# Patient Record
Sex: Female | Born: 1940 | Race: White | Hispanic: No | Smoking: Never smoker
Health system: Southern US, Community
[De-identification: ages and names within clinical notes are randomized; demographics above are authoritative.]

## PROBLEM LIST (undated history)

## (undated) DIAGNOSIS — M479 Spondylosis, unspecified: Secondary | ICD-10-CM

## (undated) DIAGNOSIS — K5790 Diverticulosis of intestine, part unspecified, without perforation or abscess without bleeding: Secondary | ICD-10-CM

## (undated) DIAGNOSIS — I2510 Atherosclerotic heart disease of native coronary artery without angina pectoris: Secondary | ICD-10-CM

## (undated) DIAGNOSIS — R569 Unspecified convulsions: Secondary | ICD-10-CM

## (undated) DIAGNOSIS — G40909 Epilepsy, unspecified, not intractable, without status epilepticus: Secondary | ICD-10-CM

## (undated) DIAGNOSIS — I4891 Unspecified atrial fibrillation: Secondary | ICD-10-CM

## (undated) DIAGNOSIS — K219 Gastro-esophageal reflux disease without esophagitis: Secondary | ICD-10-CM

## (undated) DIAGNOSIS — K8020 Calculus of gallbladder without cholecystitis without obstruction: Secondary | ICD-10-CM

## (undated) DIAGNOSIS — K589 Irritable bowel syndrome without diarrhea: Secondary | ICD-10-CM

## (undated) DIAGNOSIS — D649 Anemia, unspecified: Secondary | ICD-10-CM

## (undated) DIAGNOSIS — J309 Allergic rhinitis, unspecified: Secondary | ICD-10-CM

## (undated) DIAGNOSIS — F322 Major depressive disorder, single episode, severe without psychotic features: Secondary | ICD-10-CM

## (undated) DIAGNOSIS — E785 Hyperlipidemia, unspecified: Secondary | ICD-10-CM

## (undated) HISTORY — DX: Anemia, unspecified: D64.9

## (undated) HISTORY — DX: Epilepsy, unspecified, not intractable, without status epilepticus: G40.909

## (undated) HISTORY — DX: Spondylosis, unspecified: M47.9

## (undated) HISTORY — DX: Major depressive disorder, single episode, severe without psychotic features: F32.2

## (undated) HISTORY — DX: Unspecified atrial fibrillation: I48.91

## (undated) HISTORY — DX: Atherosclerotic heart disease of native coronary artery without angina pectoris: I25.10

## (undated) HISTORY — DX: Diverticulosis of intestine, part unspecified, without perforation or abscess without bleeding: K57.90

## (undated) HISTORY — DX: Allergic rhinitis, unspecified: J30.9

## (undated) HISTORY — DX: Irritable bowel syndrome, unspecified: K58.9

## (undated) HISTORY — DX: Hyperlipidemia, unspecified: E78.5

## (undated) HISTORY — DX: Gastro-esophageal reflux disease without esophagitis: K21.9

## (undated) HISTORY — PX: WRIST FRACTURE SURGERY: SHX121

## (undated) HISTORY — DX: Calculus of gallbladder without cholecystitis without obstruction: K80.20

## (undated) HISTORY — PX: TONSILLECTOMY: SUR1361

## (undated) HISTORY — DX: Unspecified convulsions: R56.9

## (undated) HISTORY — PX: TOTAL ABDOMINAL HYSTERECTOMY: SHX209

---

## 2000-05-27 ENCOUNTER — Ambulatory Visit (HOSPITAL_COMMUNITY): Admission: RE | Admit: 2000-05-27 | Discharge: 2000-05-27 | Payer: Self-pay | Admitting: Orthopedic Surgery

## 2000-05-27 ENCOUNTER — Encounter: Payer: Self-pay | Admitting: Orthopedic Surgery

## 2000-06-30 ENCOUNTER — Ambulatory Visit (HOSPITAL_COMMUNITY): Admission: RE | Admit: 2000-06-30 | Discharge: 2000-06-30 | Payer: Self-pay | Admitting: *Deleted

## 2000-07-12 ENCOUNTER — Ambulatory Visit (HOSPITAL_COMMUNITY): Admission: RE | Admit: 2000-07-12 | Discharge: 2000-07-12 | Payer: Self-pay | Admitting: Internal Medicine

## 2005-01-21 ENCOUNTER — Ambulatory Visit: Payer: Self-pay | Admitting: Family Medicine

## 2005-01-28 ENCOUNTER — Ambulatory Visit: Payer: Self-pay | Admitting: Family Medicine

## 2005-02-04 ENCOUNTER — Ambulatory Visit: Payer: Self-pay | Admitting: Family Medicine

## 2005-06-01 ENCOUNTER — Encounter: Payer: Self-pay | Admitting: Family Medicine

## 2005-06-10 ENCOUNTER — Ambulatory Visit: Payer: Self-pay | Admitting: Family Medicine

## 2005-06-24 ENCOUNTER — Encounter: Admission: RE | Admit: 2005-06-24 | Discharge: 2005-06-24 | Payer: Self-pay | Admitting: Family Medicine

## 2005-07-22 ENCOUNTER — Ambulatory Visit: Payer: Self-pay | Admitting: Family Medicine

## 2005-07-29 ENCOUNTER — Ambulatory Visit: Payer: Self-pay | Admitting: Family Medicine

## 2005-07-29 ENCOUNTER — Other Ambulatory Visit: Admission: RE | Admit: 2005-07-29 | Discharge: 2005-07-29 | Payer: Self-pay | Admitting: Family Medicine

## 2005-07-29 ENCOUNTER — Encounter: Payer: Self-pay | Admitting: Family Medicine

## 2006-06-30 ENCOUNTER — Ambulatory Visit: Payer: Self-pay | Admitting: Family Medicine

## 2006-11-18 ENCOUNTER — Ambulatory Visit: Payer: Self-pay | Admitting: Family Medicine

## 2006-11-18 LAB — CONVERTED CEMR LAB: Phenobarbital: 31.7 ug/mL (ref 15.0–40.0)

## 2007-01-04 ENCOUNTER — Encounter: Payer: Self-pay | Admitting: Family Medicine

## 2007-03-03 ENCOUNTER — Encounter: Payer: Self-pay | Admitting: Family Medicine

## 2007-03-03 DIAGNOSIS — M810 Age-related osteoporosis without current pathological fracture: Secondary | ICD-10-CM

## 2007-03-22 ENCOUNTER — Ambulatory Visit: Payer: Self-pay | Admitting: Family Medicine

## 2007-03-22 DIAGNOSIS — M479 Spondylosis, unspecified: Secondary | ICD-10-CM | POA: Insufficient documentation

## 2007-03-22 LAB — CONVERTED CEMR LAB
Glucose, Urine, Semiquant: NEGATIVE
Protein, U semiquant: NEGATIVE
Urobilinogen, UA: NEGATIVE

## 2007-03-23 LAB — CONVERTED CEMR LAB
ALT: 18 units/L (ref 0–35)
Basophils Relative: 0.5 % (ref 0.0–1.0)
Bilirubin, Direct: 0.1 mg/dL (ref 0.0–0.3)
CO2: 29 meq/L (ref 19–32)
Cholesterol: 228 mg/dL (ref 0–200)
Eosinophils Relative: 0.5 % (ref 0.0–5.0)
GFR calc Af Amer: 93 mL/min
Glucose, Bld: 85 mg/dL (ref 70–99)
Hemoglobin: 13 g/dL (ref 12.0–15.0)
Lymphocytes Relative: 34 % (ref 12.0–46.0)
Monocytes Absolute: 0.6 10*3/uL (ref 0.2–0.7)
Neutro Abs: 4.8 10*3/uL (ref 1.4–7.7)
Neutrophils Relative %: 57.3 % (ref 43.0–77.0)
Potassium: 4.2 meq/L (ref 3.5–5.1)
TSH: 0.91 microintl units/mL (ref 0.35–5.50)
Total Protein: 6.8 g/dL (ref 6.0–8.3)
VLDL: 21 mg/dL (ref 0–40)
WBC: 8.2 10*3/uL (ref 4.5–10.5)

## 2007-03-30 ENCOUNTER — Encounter: Admission: RE | Admit: 2007-03-30 | Discharge: 2007-03-30 | Payer: Self-pay | Admitting: Family Medicine

## 2007-04-12 ENCOUNTER — Encounter: Payer: Self-pay | Admitting: Family Medicine

## 2007-05-04 ENCOUNTER — Telehealth: Payer: Self-pay | Admitting: Family Medicine

## 2007-05-13 ENCOUNTER — Emergency Department (HOSPITAL_COMMUNITY): Admission: EM | Admit: 2007-05-13 | Discharge: 2007-05-13 | Payer: Self-pay | Admitting: Emergency Medicine

## 2007-06-16 ENCOUNTER — Telehealth: Payer: Self-pay | Admitting: Family Medicine

## 2007-07-20 ENCOUNTER — Encounter: Payer: Self-pay | Admitting: Family Medicine

## 2007-08-03 ENCOUNTER — Ambulatory Visit: Payer: Self-pay | Admitting: Family Medicine

## 2007-08-03 DIAGNOSIS — F322 Major depressive disorder, single episode, severe without psychotic features: Secondary | ICD-10-CM | POA: Insufficient documentation

## 2007-08-09 ENCOUNTER — Telehealth: Payer: Self-pay | Admitting: Family Medicine

## 2007-08-12 ENCOUNTER — Telehealth: Payer: Self-pay | Admitting: *Deleted

## 2007-09-07 ENCOUNTER — Telehealth: Payer: Self-pay | Admitting: Family Medicine

## 2007-10-04 ENCOUNTER — Ambulatory Visit: Payer: Self-pay | Admitting: Family Medicine

## 2007-10-04 DIAGNOSIS — R609 Edema, unspecified: Secondary | ICD-10-CM

## 2007-10-05 LAB — CONVERTED CEMR LAB: Phenobarbital: 30.1 ug/mL (ref 15.0–40.0)

## 2007-11-09 ENCOUNTER — Telehealth: Payer: Self-pay | Admitting: Family Medicine

## 2008-01-24 ENCOUNTER — Ambulatory Visit: Payer: Self-pay | Admitting: Family Medicine

## 2008-01-24 DIAGNOSIS — J309 Allergic rhinitis, unspecified: Secondary | ICD-10-CM | POA: Insufficient documentation

## 2008-01-30 ENCOUNTER — Telehealth: Payer: Self-pay

## 2008-03-13 ENCOUNTER — Ambulatory Visit: Payer: Self-pay | Admitting: Family Medicine

## 2008-04-04 ENCOUNTER — Other Ambulatory Visit: Admission: RE | Admit: 2008-04-04 | Discharge: 2008-04-04 | Payer: Self-pay | Admitting: Family Medicine

## 2008-04-04 ENCOUNTER — Ambulatory Visit: Payer: Self-pay | Admitting: Family Medicine

## 2008-04-04 ENCOUNTER — Encounter: Payer: Self-pay | Admitting: Family Medicine

## 2008-04-04 DIAGNOSIS — E039 Hypothyroidism, unspecified: Secondary | ICD-10-CM | POA: Insufficient documentation

## 2008-04-04 DIAGNOSIS — E785 Hyperlipidemia, unspecified: Secondary | ICD-10-CM | POA: Insufficient documentation

## 2008-04-04 DIAGNOSIS — N390 Urinary tract infection, site not specified: Secondary | ICD-10-CM

## 2008-04-04 DIAGNOSIS — D649 Anemia, unspecified: Secondary | ICD-10-CM | POA: Insufficient documentation

## 2008-04-11 ENCOUNTER — Telehealth: Payer: Self-pay | Admitting: Family Medicine

## 2008-04-13 ENCOUNTER — Telehealth: Payer: Self-pay | Admitting: Family Medicine

## 2008-04-16 ENCOUNTER — Encounter: Payer: Self-pay | Admitting: Family Medicine

## 2008-04-18 ENCOUNTER — Telehealth: Payer: Self-pay | Admitting: Internal Medicine

## 2008-05-28 ENCOUNTER — Telehealth: Payer: Self-pay | Admitting: Family Medicine

## 2008-10-23 ENCOUNTER — Ambulatory Visit: Payer: Self-pay | Admitting: Family Medicine

## 2008-10-23 DIAGNOSIS — K219 Gastro-esophageal reflux disease without esophagitis: Secondary | ICD-10-CM

## 2008-10-23 DIAGNOSIS — H9319 Tinnitus, unspecified ear: Secondary | ICD-10-CM | POA: Insufficient documentation

## 2008-10-24 ENCOUNTER — Encounter: Payer: Self-pay | Admitting: Family Medicine

## 2008-11-05 ENCOUNTER — Telehealth: Payer: Self-pay | Admitting: Family Medicine

## 2008-11-06 LAB — CONVERTED CEMR LAB
Albumin: 3.8 g/dL (ref 3.5–5.2)
Alkaline Phosphatase: 103 units/L (ref 39–117)
Bilirubin, Direct: 0 mg/dL (ref 0.0–0.3)
HDL: 50.4 mg/dL (ref >39.00)
Phenobarbital: 36.5 ug/mL (ref 15.0–40.0)
Total CHOL/HDL Ratio: 4
VLDL: 23.8 mg/dL (ref 0.0–40.0)

## 2008-11-14 ENCOUNTER — Ambulatory Visit: Payer: Self-pay | Admitting: Family Medicine

## 2008-11-14 DIAGNOSIS — K589 Irritable bowel syndrome without diarrhea: Secondary | ICD-10-CM

## 2008-11-14 DIAGNOSIS — K8020 Calculus of gallbladder without cholecystitis without obstruction: Secondary | ICD-10-CM | POA: Insufficient documentation

## 2008-11-15 ENCOUNTER — Telehealth: Payer: Self-pay | Admitting: Family Medicine

## 2008-11-16 ENCOUNTER — Encounter: Admission: RE | Admit: 2008-11-16 | Discharge: 2008-11-16 | Payer: Self-pay | Admitting: Family Medicine

## 2008-11-20 ENCOUNTER — Telehealth: Payer: Self-pay | Admitting: Family Medicine

## 2008-11-26 ENCOUNTER — Telehealth: Payer: Self-pay | Admitting: Family Medicine

## 2008-12-11 ENCOUNTER — Ambulatory Visit: Payer: Self-pay | Admitting: Family Medicine

## 2008-12-17 ENCOUNTER — Telehealth (INDEPENDENT_AMBULATORY_CARE_PROVIDER_SITE_OTHER): Payer: Self-pay | Admitting: *Deleted

## 2009-01-01 ENCOUNTER — Encounter: Payer: Self-pay | Admitting: Family Medicine

## 2009-01-02 ENCOUNTER — Ambulatory Visit: Payer: Self-pay | Admitting: Family Medicine

## 2009-01-02 ENCOUNTER — Telehealth (INDEPENDENT_AMBULATORY_CARE_PROVIDER_SITE_OTHER): Payer: Self-pay | Admitting: *Deleted

## 2009-01-02 DIAGNOSIS — R079 Chest pain, unspecified: Secondary | ICD-10-CM

## 2009-01-02 DIAGNOSIS — I2510 Atherosclerotic heart disease of native coronary artery without angina pectoris: Secondary | ICD-10-CM | POA: Insufficient documentation

## 2009-01-03 ENCOUNTER — Ambulatory Visit: Payer: Self-pay | Admitting: Cardiology

## 2009-01-03 ENCOUNTER — Encounter: Payer: Self-pay | Admitting: Family Medicine

## 2009-01-03 ENCOUNTER — Inpatient Hospital Stay (HOSPITAL_COMMUNITY): Admission: AD | Admit: 2009-01-03 | Discharge: 2009-01-05 | Payer: Self-pay | Admitting: Cardiology

## 2009-01-03 ENCOUNTER — Ambulatory Visit: Payer: Self-pay

## 2009-01-09 ENCOUNTER — Telehealth (INDEPENDENT_AMBULATORY_CARE_PROVIDER_SITE_OTHER): Payer: Self-pay | Admitting: *Deleted

## 2009-01-10 ENCOUNTER — Encounter: Payer: Self-pay | Admitting: Cardiology

## 2009-01-10 ENCOUNTER — Ambulatory Visit: Payer: Self-pay

## 2009-01-11 ENCOUNTER — Ambulatory Visit: Payer: Self-pay

## 2009-01-11 ENCOUNTER — Encounter: Payer: Self-pay | Admitting: Cardiology

## 2009-01-14 ENCOUNTER — Telehealth: Payer: Self-pay | Admitting: Cardiology

## 2009-01-22 ENCOUNTER — Ambulatory Visit: Payer: Self-pay | Admitting: Cardiology

## 2009-01-29 ENCOUNTER — Ambulatory Visit: Payer: Self-pay | Admitting: Cardiology

## 2009-02-05 ENCOUNTER — Telehealth: Payer: Self-pay | Admitting: Cardiology

## 2009-02-11 LAB — CONVERTED CEMR LAB
ALT: 17 units/L (ref 0–35)
Albumin: 3.9 g/dL (ref 3.5–5.2)
Alkaline Phosphatase: 95 units/L (ref 39–117)
BUN: 23 mg/dL (ref 6–23)
Chloride: 106 meq/L (ref 96–112)
Cholesterol: 167 mg/dL (ref 0–200)
Creatinine, Ser: 1.4 mg/dL — ABNORMAL HIGH (ref 0.4–1.2)
GFR calc non Af Amer: 39.88 mL/min (ref >60)
LDL Cholesterol: 95 mg/dL (ref 0–99)
Total Protein: 7.8 g/dL (ref 6.0–8.3)
Triglycerides: 118 mg/dL (ref 0.0–149.0)
VLDL: 23.6 mg/dL (ref 0.0–40.0)
Vit D, 25-Hydroxy: 52 ng/mL (ref 30–89)

## 2009-02-28 ENCOUNTER — Ambulatory Visit: Payer: Self-pay | Admitting: Family Medicine

## 2009-04-03 ENCOUNTER — Encounter: Payer: Self-pay | Admitting: Family Medicine

## 2009-04-10 ENCOUNTER — Ambulatory Visit: Payer: Self-pay | Admitting: Family Medicine

## 2009-04-10 ENCOUNTER — Encounter: Payer: Self-pay | Admitting: Family Medicine

## 2009-04-10 ENCOUNTER — Other Ambulatory Visit: Admission: RE | Admit: 2009-04-10 | Discharge: 2009-04-10 | Payer: Self-pay | Admitting: Family Medicine

## 2009-04-10 DIAGNOSIS — M719 Bursopathy, unspecified: Secondary | ICD-10-CM

## 2009-04-10 DIAGNOSIS — M940 Chondrocostal junction syndrome [Tietze]: Secondary | ICD-10-CM

## 2009-04-10 DIAGNOSIS — R259 Unspecified abnormal involuntary movements: Secondary | ICD-10-CM | POA: Insufficient documentation

## 2009-04-10 DIAGNOSIS — M67919 Unspecified disorder of synovium and tendon, unspecified shoulder: Secondary | ICD-10-CM | POA: Insufficient documentation

## 2009-04-10 LAB — CONVERTED CEMR LAB
Blood in Urine, dipstick: NEGATIVE
Glucose, Urine, Semiquant: NEGATIVE
Nitrite: NEGATIVE
Specific Gravity, Urine: 1.015
WBC Urine, dipstick: NEGATIVE
pH: 7

## 2009-04-17 ENCOUNTER — Encounter: Payer: Self-pay | Admitting: Family Medicine

## 2009-04-17 LAB — CONVERTED CEMR LAB
AST: 22 units/L (ref 0–37)
Albumin: 4 g/dL (ref 3.5–5.2)
Alkaline Phosphatase: 98 units/L (ref 39–117)
Basophils Absolute: 0 10*3/uL (ref 0.0–0.1)
Basophils Relative: 0.5 % (ref 0.0–3.0)
CO2: 30 meq/L (ref 19–32)
Calcium: 9.5 mg/dL (ref 8.4–10.5)
Chloride: 101 meq/L (ref 96–112)
Eosinophils Absolute: 0.1 10*3/uL (ref 0.0–0.7)
Glucose, Bld: 94 mg/dL (ref 70–99)
HCT: 34.7 % — ABNORMAL LOW (ref 36.0–46.0)
HDL: 54.1 mg/dL (ref >39.00)
Hemoglobin: 12.2 g/dL (ref 12.0–15.0)
Lymphocytes Relative: 26.9 % (ref 12.0–46.0)
Lymphs Abs: 1.8 10*3/uL (ref 0.7–4.0)
MCHC: 35.3 g/dL (ref 30.0–36.0)
MCV: 94.5 fL (ref 78.0–100.0)
Neutro Abs: 4.4 10*3/uL (ref 1.4–7.7)
Potassium: 4 meq/L (ref 3.5–5.1)
RBC: 3.67 M/uL — ABNORMAL LOW (ref 3.87–5.11)
RDW: 12.6 % (ref 11.5–14.6)
Sodium: 142 meq/L (ref 135–145)
TSH: 0.35 microintl units/mL (ref 0.35–5.50)
Total CHOL/HDL Ratio: 3
Total Protein: 7.8 g/dL (ref 6.0–8.3)

## 2009-04-30 ENCOUNTER — Ambulatory Visit: Payer: Self-pay | Admitting: Cardiology

## 2009-06-05 ENCOUNTER — Ambulatory Visit: Payer: Self-pay | Admitting: Internal Medicine

## 2009-06-05 ENCOUNTER — Encounter (INDEPENDENT_AMBULATORY_CARE_PROVIDER_SITE_OTHER): Payer: Self-pay | Admitting: *Deleted

## 2009-06-06 ENCOUNTER — Telehealth: Payer: Self-pay | Admitting: Family Medicine

## 2009-06-18 ENCOUNTER — Ambulatory Visit: Payer: Self-pay | Admitting: Internal Medicine

## 2009-06-21 ENCOUNTER — Encounter: Payer: Self-pay | Admitting: Internal Medicine

## 2009-06-24 ENCOUNTER — Ambulatory Visit: Payer: Self-pay | Admitting: Cardiology

## 2009-07-01 LAB — CONVERTED CEMR LAB
AST: 21 units/L (ref 0–37)
Alkaline Phosphatase: 93 units/L (ref 39–117)
Bilirubin, Direct: 0.1 mg/dL (ref 0.0–0.3)
Cholesterol: 132 mg/dL (ref 0–200)
LDL Cholesterol: 63 mg/dL (ref 0–99)
Total CHOL/HDL Ratio: 2

## 2009-07-17 ENCOUNTER — Ambulatory Visit: Payer: Self-pay | Admitting: Family Medicine

## 2009-07-17 DIAGNOSIS — J0100 Acute maxillary sinusitis, unspecified: Secondary | ICD-10-CM

## 2009-07-20 ENCOUNTER — Ambulatory Visit: Payer: Self-pay | Admitting: Cardiology

## 2009-07-20 ENCOUNTER — Ambulatory Visit: Payer: Self-pay | Admitting: Pulmonary Disease

## 2009-07-21 ENCOUNTER — Encounter: Payer: Self-pay | Admitting: Cardiology

## 2009-07-22 ENCOUNTER — Encounter: Payer: Self-pay | Admitting: Cardiology

## 2009-07-22 ENCOUNTER — Telehealth: Payer: Self-pay | Admitting: Family Medicine

## 2009-07-23 ENCOUNTER — Encounter: Payer: Self-pay | Admitting: Cardiology

## 2009-07-31 ENCOUNTER — Ambulatory Visit: Payer: Self-pay | Admitting: Cardiology

## 2009-07-31 ENCOUNTER — Encounter: Payer: Self-pay | Admitting: Adult Health

## 2009-07-31 ENCOUNTER — Ambulatory Visit: Payer: Self-pay | Admitting: Critical Care Medicine

## 2009-07-31 DIAGNOSIS — I4891 Unspecified atrial fibrillation: Secondary | ICD-10-CM

## 2009-07-31 DIAGNOSIS — J984 Other disorders of lung: Secondary | ICD-10-CM | POA: Insufficient documentation

## 2009-07-31 DIAGNOSIS — E041 Nontoxic single thyroid nodule: Secondary | ICD-10-CM | POA: Insufficient documentation

## 2009-08-02 ENCOUNTER — Telehealth (INDEPENDENT_AMBULATORY_CARE_PROVIDER_SITE_OTHER): Payer: Self-pay | Admitting: *Deleted

## 2009-08-02 ENCOUNTER — Telehealth: Payer: Self-pay | Admitting: Cardiology

## 2009-08-05 ENCOUNTER — Telehealth (INDEPENDENT_AMBULATORY_CARE_PROVIDER_SITE_OTHER): Payer: Self-pay | Admitting: *Deleted

## 2009-08-07 ENCOUNTER — Ambulatory Visit: Payer: Self-pay | Admitting: Family Medicine

## 2009-08-13 ENCOUNTER — Other Ambulatory Visit: Admission: RE | Admit: 2009-08-13 | Discharge: 2009-08-13 | Payer: Self-pay | Admitting: Interventional Radiology

## 2009-08-13 ENCOUNTER — Encounter: Payer: Self-pay | Admitting: Cardiology

## 2009-08-13 ENCOUNTER — Encounter: Admission: RE | Admit: 2009-08-13 | Discharge: 2009-08-13 | Payer: Self-pay | Admitting: Cardiology

## 2009-08-19 ENCOUNTER — Ambulatory Visit: Payer: Self-pay | Admitting: Cardiology

## 2009-08-22 ENCOUNTER — Ambulatory Visit: Payer: Self-pay | Admitting: Critical Care Medicine

## 2009-08-22 DIAGNOSIS — J4540 Moderate persistent asthma, uncomplicated: Secondary | ICD-10-CM

## 2009-08-28 ENCOUNTER — Telehealth: Payer: Self-pay | Admitting: Family Medicine

## 2009-09-17 ENCOUNTER — Ambulatory Visit: Payer: Self-pay | Admitting: Cardiology

## 2009-09-18 ENCOUNTER — Ambulatory Visit: Payer: Self-pay | Admitting: Cardiology

## 2009-09-24 LAB — CONVERTED CEMR LAB
AST: 25 units/L (ref 0–37)
Albumin: 3.5 g/dL (ref 3.5–5.2)
Alkaline Phosphatase: 74 units/L (ref 39–117)
Total Protein: 6.3 g/dL (ref 6.0–8.3)

## 2009-10-03 LAB — CONVERTED CEMR LAB: Cholesterol: 136 mg/dL (ref 0–200)

## 2009-11-12 ENCOUNTER — Ambulatory Visit: Payer: Self-pay | Admitting: Family Medicine

## 2009-11-19 ENCOUNTER — Ambulatory Visit: Payer: Self-pay | Admitting: Cardiology

## 2009-11-26 ENCOUNTER — Ambulatory Visit: Payer: Self-pay | Admitting: Critical Care Medicine

## 2009-11-28 ENCOUNTER — Telehealth: Payer: Self-pay | Admitting: Critical Care Medicine

## 2009-12-23 ENCOUNTER — Telehealth (INDEPENDENT_AMBULATORY_CARE_PROVIDER_SITE_OTHER): Payer: Self-pay | Admitting: *Deleted

## 2009-12-30 ENCOUNTER — Telehealth (INDEPENDENT_AMBULATORY_CARE_PROVIDER_SITE_OTHER): Payer: Self-pay | Admitting: *Deleted

## 2010-03-19 ENCOUNTER — Ambulatory Visit: Payer: Self-pay | Admitting: Cardiology

## 2010-03-22 ENCOUNTER — Ambulatory Visit: Payer: Self-pay | Admitting: Cardiology

## 2010-03-22 ENCOUNTER — Inpatient Hospital Stay (HOSPITAL_COMMUNITY): Admission: EM | Admit: 2010-03-22 | Discharge: 2010-03-23 | Payer: Self-pay | Admitting: Emergency Medicine

## 2010-03-24 ENCOUNTER — Telehealth: Payer: Self-pay | Admitting: Cardiology

## 2010-03-25 ENCOUNTER — Ambulatory Visit: Payer: Self-pay | Admitting: Cardiology

## 2010-03-25 ENCOUNTER — Ambulatory Visit: Payer: Self-pay | Admitting: Critical Care Medicine

## 2010-03-31 ENCOUNTER — Encounter: Payer: Self-pay | Admitting: Physician Assistant

## 2010-04-01 ENCOUNTER — Encounter: Payer: Self-pay | Admitting: Physician Assistant

## 2010-04-01 ENCOUNTER — Ambulatory Visit: Payer: Self-pay | Admitting: Cardiovascular Disease

## 2010-04-01 DIAGNOSIS — N259 Disorder resulting from impaired renal tubular function, unspecified: Secondary | ICD-10-CM | POA: Insufficient documentation

## 2010-04-01 LAB — CONVERTED CEMR LAB
CO2: 30 meq/L (ref 19–32)
Chloride: 106 meq/L (ref 96–112)
Glucose, Bld: 96 mg/dL (ref 70–99)
Potassium: 4.5 meq/L (ref 3.5–5.1)
Sodium: 143 meq/L (ref 135–145)

## 2010-04-22 ENCOUNTER — Encounter: Payer: Self-pay | Admitting: Family Medicine

## 2010-04-24 ENCOUNTER — Emergency Department (HOSPITAL_COMMUNITY): Admission: EM | Admit: 2010-04-24 | Discharge: 2010-04-24 | Payer: Self-pay | Admitting: Emergency Medicine

## 2010-05-05 ENCOUNTER — Encounter: Payer: Self-pay | Admitting: Cardiology

## 2010-05-05 ENCOUNTER — Ambulatory Visit: Payer: Self-pay | Admitting: Cardiology

## 2010-05-06 ENCOUNTER — Ambulatory Visit: Payer: Self-pay | Admitting: Critical Care Medicine

## 2010-05-08 ENCOUNTER — Inpatient Hospital Stay (HOSPITAL_COMMUNITY): Admission: EM | Admit: 2010-05-08 | Discharge: 2009-07-24 | Payer: Self-pay | Admitting: Emergency Medicine

## 2010-05-08 ENCOUNTER — Ambulatory Visit: Payer: Self-pay | Admitting: Cardiology

## 2010-05-09 LAB — CONVERTED CEMR LAB
BUN: 26 mg/dL — ABNORMAL HIGH (ref 6–23)
CO2: 30 meq/L (ref 19–32)
Chloride: 103 meq/L (ref 96–112)
Creatinine, Ser: 1.5 mg/dL — ABNORMAL HIGH (ref 0.4–1.2)
Glucose, Bld: 95 mg/dL (ref 70–99)

## 2010-05-14 LAB — CONVERTED CEMR LAB
GFR calc non Af Amer: 43.66 mL/min — ABNORMAL LOW (ref >60.00)
Potassium: 4.1 meq/L (ref 3.5–5.1)
Sodium: 143 meq/L (ref 135–145)

## 2010-05-19 ENCOUNTER — Telehealth: Payer: Self-pay | Admitting: Cardiology

## 2010-05-27 ENCOUNTER — Encounter: Payer: Self-pay | Admitting: Family Medicine

## 2010-05-27 ENCOUNTER — Ambulatory Visit: Payer: Self-pay | Admitting: Family Medicine

## 2010-06-03 ENCOUNTER — Telehealth: Payer: Self-pay | Admitting: Family Medicine

## 2010-06-03 LAB — CONVERTED CEMR LAB
Basophils Relative: 0.2 % (ref 0.0–3.0)
CO2: 28 meq/L (ref 19–32)
Calcium: 8.8 mg/dL (ref 8.4–10.5)
Chloride: 107 meq/L (ref 96–112)
Eosinophils Absolute: 0 10*3/uL (ref 0.0–0.7)
Eosinophils Relative: 0 % (ref 0.0–5.0)
Hemoglobin: 10.9 g/dL — ABNORMAL LOW (ref 12.0–15.0)
Lymphocytes Relative: 15.5 % (ref 12.0–46.0)
MCHC: 33.4 g/dL (ref 30.0–36.0)
MCV: 95.5 fL (ref 78.0–100.0)
Neutro Abs: 7.7 10*3/uL (ref 1.4–7.7)
RBC: 3.4 M/uL — ABNORMAL LOW (ref 3.87–5.11)
Sodium: 143 meq/L (ref 135–145)
WBC: 9.7 10*3/uL (ref 4.5–10.5)

## 2010-06-13 ENCOUNTER — Ambulatory Visit
Admission: RE | Admit: 2010-06-13 | Discharge: 2010-06-13 | Payer: Self-pay | Source: Home / Self Care | Attending: Internal Medicine | Admitting: Internal Medicine

## 2010-06-17 ENCOUNTER — Inpatient Hospital Stay (HOSPITAL_COMMUNITY)
Admission: EM | Admit: 2010-06-17 | Discharge: 2010-06-18 | Payer: Self-pay | Source: Home / Self Care | Attending: Cardiovascular Disease | Admitting: Cardiovascular Disease

## 2010-06-17 ENCOUNTER — Ambulatory Visit: Admit: 2010-06-17 | Payer: Self-pay | Admitting: Cardiology

## 2010-06-18 LAB — DIFFERENTIAL
Basophils Absolute: 0 10*3/uL (ref 0.0–0.1)
Basophils Relative: 0 % (ref 0–1)
Eosinophils Absolute: 0.2 10*3/uL (ref 0.0–0.7)
Eosinophils Relative: 2 % (ref 0–5)
Lymphocytes Relative: 33 % (ref 12–46)
Lymphs Abs: 2.6 10*3/uL (ref 0.7–4.0)
Monocytes Absolute: 0.6 10*3/uL (ref 0.1–1.0)
Monocytes Relative: 7 % (ref 3–12)
Neutro Abs: 4.7 10*3/uL (ref 1.7–7.7)
Neutrophils Relative %: 58 % (ref 43–77)

## 2010-06-18 LAB — BASIC METABOLIC PANEL
BUN: 23 mg/dL (ref 6–23)
CO2: 24 mEq/L (ref 19–32)
Calcium: 8.9 mg/dL (ref 8.4–10.5)
Chloride: 109 mEq/L (ref 96–112)
Creatinine, Ser: 1.06 mg/dL (ref 0.4–1.2)
GFR calc Af Amer: >60 mL/min (ref >60)
GFR calc non Af Amer: 52 mL/min — ABNORMAL LOW (ref >60)
Glucose, Bld: 127 mg/dL — ABNORMAL HIGH (ref 70–99)
Potassium: 3.2 mEq/L — ABNORMAL LOW (ref 3.5–5.1)
Sodium: 142 mEq/L (ref 135–145)

## 2010-06-18 LAB — CBC
HCT: 35.5 % — ABNORMAL LOW (ref 36.0–46.0)
Hemoglobin: 11.3 g/dL — ABNORMAL LOW (ref 12.0–15.0)
MCH: 30.3 pg (ref 26.0–34.0)
MCHC: 31.8 g/dL (ref 30.0–36.0)
MCV: 95.2 fL (ref 78.0–100.0)
Platelets: 232 10*3/uL (ref 150–400)
RBC: 3.73 MIL/uL — ABNORMAL LOW (ref 3.87–5.11)
RDW: 13.3 % (ref 11.5–15.5)
WBC: 8.1 10*3/uL (ref 4.0–10.5)

## 2010-06-18 LAB — POCT CARDIAC MARKERS
CKMB, poc: <1 ng/mL — ABNORMAL LOW (ref 1.0–8.0)
Myoglobin, poc: 66.6 ng/mL (ref 12–200)
Troponin i, poc: <0.05 ng/mL (ref 0.00–0.09)

## 2010-06-18 LAB — MRSA PCR SCREENING: MRSA by PCR: NEGATIVE

## 2010-06-23 LAB — CBC
HCT: 32.4 % — ABNORMAL LOW (ref 36.0–46.0)
Hemoglobin: 10.1 g/dL — ABNORMAL LOW (ref 12.0–15.0)
MCH: 29.9 pg (ref 26.0–34.0)
MCHC: 31.2 g/dL (ref 30.0–36.0)
Platelets: 209 10*3/uL (ref 150–400)
RBC: 3.38 MIL/uL — ABNORMAL LOW (ref 3.87–5.11)
RDW: 13.5 % (ref 11.5–15.5)
WBC: 8 10*3/uL (ref 4.0–10.5)

## 2010-06-23 LAB — BASIC METABOLIC PANEL
Calcium: 8.6 mg/dL (ref 8.4–10.5)
GFR calc non Af Amer: 53 mL/min — ABNORMAL LOW (ref >60)
Glucose, Bld: 103 mg/dL — ABNORMAL HIGH (ref 70–99)
Sodium: 140 mEq/L (ref 135–145)

## 2010-06-23 LAB — HEPATIC FUNCTION PANEL
ALT: 10 U/L (ref 0–35)
Albumin: 3 g/dL — ABNORMAL LOW (ref 3.5–5.2)
Alkaline Phosphatase: 76 U/L (ref 39–117)
Bilirubin, Direct: <0.1 mg/dL (ref 0.0–0.3)
Total Protein: 5.8 g/dL — ABNORMAL LOW (ref 6.0–8.3)

## 2010-06-29 LAB — CONVERTED CEMR LAB
ALT: 20 units/L (ref 0–35)
AST: 23 units/L (ref 0–37)
Albumin: 3.8 g/dL (ref 3.5–5.2)
Alkaline Phosphatase: 94 units/L (ref 39–117)
BUN: 31 mg/dL — ABNORMAL HIGH (ref 6–23)
Basophils Relative: 0.7 % (ref 0.0–3.0)
Blood in Urine, dipstick: NEGATIVE
CO2: 31 meq/L (ref 19–32)
Chloride: 102 meq/L (ref 96–112)
Creatinine, Ser: 1.2 mg/dL (ref 0.4–1.2)
Direct LDL: 158.8 mg/dL
Eosinophils Absolute: 0 10*3/uL (ref 0.0–0.7)
Eosinophils Relative: 0.6 % (ref 0.0–5.0)
Glucose, Bld: 86 mg/dL (ref 70–99)
HDL: 53.4 mg/dL (ref >39.0)
Lymphocytes Relative: 26.8 % (ref 12.0–46.0)
MCV: 94.3 fL (ref 78.0–100.0)
Monocytes Relative: 5.1 % (ref 3.0–12.0)
Neutrophils Relative %: 66.8 % (ref 43.0–77.0)
Nitrite: NEGATIVE
RBC: 3.82 M/uL — ABNORMAL LOW (ref 3.87–5.11)
Specific Gravity, Urine: 1.015
Total CHOL/HDL Ratio: 4.8
WBC Urine, dipstick: NEGATIVE
WBC: 7.1 10*3/uL (ref 4.5–10.5)

## 2010-07-01 NOTE — Procedures (Signed)
Summary: Colonoscopy  Patient: Misty Meyer Note: All result statuses are Final unless otherwise noted.  Tests: (1) Colonoscopy (COL)   COL Colonoscopy           DONE     [facility]     [address]     [place], [place]  [address]           COLONOSCOPY PROCEDURE REPORT           PATIENT:  [name], [name]  MR#:  [id number]     BIRTHDATE:  May 15, 1941, 68 yrs. old  GENDER:  female           ENDOSCOPIST:  Wilhemina Bonito. Eda Keys, MD     Referred by:  Dianna Limbo, M.D.           PROCEDURE DATE:  06/18/2009     PROCEDURE:  Colonoscopy with snare polypectomy,     Colonoscopy for control of     bleeding (endo clip placement prophylactically)     ASA CLASS:  Class II     INDICATIONS:  Routine Risk Screening           MEDICATIONS:   Fentanyl 75 mcg IV, Versed 7 mg IV           DESCRIPTION OF PROCEDURE:   After the risks benefits and     alternatives of the procedure were thoroughly explained, informed     consent was obtained.  Digital rectal exam was performed and     revealed no abnormalities.   The LB CF-[id number] endoscope     was introduced through the anus and advanced to the cecum, which     was identified by both the appendix and ileocecal valve, without     limitations. Time to cecum = 3:56 min.The quality of the prep was     excellent, using MoviPrep.  The instrument was then slowly     withdrawn (Time = 16:00 min)as the colon was fully examined.     <<PROCEDUREIMAGES>>           FINDINGS:  A 3.5cm broadbased pedunculated polyp was found in the     sigmoid colon. Polyp was snared, then cauterized with monopolar     cautery. Retrieval was successful. Though no bleeding was     percipitated, the stock was broad based and pulatile. Thus, a     Resolution endo clip was used to close the defect.  An 8mm sessile     polyp was found in the descending colon. Polyp was snared without     cautery. Retrieval was successful.   Mild diverticulosis was found     in the  sigmoid colon.   Retroflexed views in the rectum revealed     no abnormalities.    The scope was then withdrawn from the patient     and the procedure completed.           COMPLICATIONS:  None           ENDOSCOPIC IMPRESSION:     1) Pedunculated polyp in the sigmoid colon - removed and endo     clip placed     2) Sessile polyp in the descending colon - removed     3) Mild diverticulosis in the sigmoid colon     RECOMMENDATIONS:     1) Follow up colonoscopy in in 1-3 years (pending path)           ______________________________     Jonny Ruiz  Lanelle Bal, MD           CC:  Tawny Asal, MD; The Patient           n.     eSIGNED:   Wilhemina Bonito. Eda Keys at 06/18/2009 03:48 PM           Lyndon Code, [id number]  Note: An exclamation mark (!) indicates a result that was not dispersed into the flowsheet. Document Creation Date: 06/18/2009 3:48 PM _______________________________________________________________________  (1) Order result status: Final Collection or observation date-time: 06/18/2009 15:34 Requested date-time:  Receipt date-time:  Reported date-time:  Referring Physician:   Ordering Physician: Fransico Setters [phone]) Specimen Source:  Source: Launa Grill Order Number: [id number] Lab site:   Appended Document: Colonoscopy     Procedures Next Due Date:    Colonoscopy: 06/2012  Appended Document: Colonoscopy reviewed

## 2010-07-01 NOTE — Progress Notes (Signed)
Summary: Change Xopenex  Phone Note Call from Patient Call back at Home Phone [phone]   Caller: Patient Call For: tammy p  Reason for Call: Talk to Nurse Summary of Call: pt took card that was given to her for xopenex and was told that she did not qualify because she has Medicare Part D.  Please call pt back Initial call taken by: Eugene Gavia,  August 05, 2009 1:33 PM  Follow-up for Phone Call        The patient states she called the number on a card given to her in the office for Xopenex and was told that because she has MCR Part D, she cannot use the card and does not qualify. The pt says she cannot afford to spend $65 at the pharmacy for the RX and would like this changed to another type of inhaler for rescue purposes. Please advise. Follow-up by: Michel Bickers CMA,  August 05, 2009 2:36 PM  Additional Follow-up for Phone Call Additional follow up Details #1::        Pt was to state she has no insurance as I was explained from the drug rep to tell pts; If she is unable to use the card then please get new Rx approval from PW; the only reason TP is listed on the Rx is because PW was gone this am. Reynaldo Minium CMA  August 05, 2009 2:47 PM   Msg forwarded to Dr. Delford Field for inhaler change.Michel Bickers CMA  August 05, 2009 2:56 PM    Additional Follow-up for Phone Call Additional follow up Details #2::    get her any inhaler albuterol type she can afford  Follow-up by: Storm Frisk MD,  August 05, 2009 5:21 PM  Additional Follow-up for Phone Call Additional follow up Details #3:: Details for Additional Follow-up Action Taken: The patient is aware of rescue inhaler being changed from Xopenex to [employer].  She will call if she has any problems or questions regarding this medication. Additional Follow-up by: Michel Bickers CMA,  August 05, 2009 5:31 PM  New/Updated Medications: PROAIR HFA 108 (90 BASE) MCG/ACT AERS (ALBUTEROL SULFATE) 2 puffs every four hours as  needed Prescriptions: PROAIR HFA 108 (90 BASE) MCG/ACT AERS (ALBUTEROL SULFATE) 2 puffs every four hours as needed  #1 x 6   Entered by:   Michel Bickers CMA   Authorized by:   Storm Frisk MD   Signed by:   Michel Bickers CMA on 08/05/2009   Method used:   Electronically to        [facility]. [phone]* (retail)       [address]       [place], [place]  [address]       Ph: [phone]       Fax: [phone]   RxID:   [id number]

## 2010-07-01 NOTE — Progress Notes (Signed)
Summary: re meds  Phone Note Call from Patient   Caller: Patient [phone] Reason for Call: Talk to Nurse Summary of Call: pt caling re meds Initial call taken by: Glynda Jaeger,  March 24, 2010 11:51 AM  Follow-up for Phone Call        The pt was discharged from the hospital yesterday.  The pt is very confused about her medications.  The pt wanted to know if she just takes the 180mg  about an hour after her 120mg  Cardizem. I made the pt aware that she should STOP Cardizem 120mg  and START Cardizem 180mg  daily.  The pt was given a new Rx at the hospital. I instructed her to go to the pharmacy today and get this medication filled.  She will start it tomorrow since she has already taken a 120mg  today.  Follow-up by: Julieta Gutting, RN, BSN,  March 24, 2010 12:12 PM    New/Updated Medications: DILT-CD 180 MG XR24H-CAP (DILTIAZEM HCL COATED BEADS) take one capsule daily

## 2010-07-01 NOTE — Assessment & Plan Note (Signed)
Summary: eph   Visit Type:  EPH  CC:  pt states she has had pain in both arms...denies any cp or sob....c/o some edema.  History of Present Illness: Overall doing well.  Thinks her symptoms are improved.  Went to see Pulm today, and some medication changes.  Does not think she has been out of rhythm.  Current Medications (verified): 1)  Boniva 150 Mg  Tabs (Ibandronate Sodium) .... Take 1 Tab By Mouth Once A Month 2)  Phenobarbital 60 Mg  Tabs (Phenobarbital) .... Take 1 Tablet Twice A Day 3)  Crestor 20 Mg Tabs (Rosuvastatin Calcium) .... Take 1 Tab By Mouth At Bedtime 4)  Prevacid 15 Mg Cpdr (Lansoprazole) .Marland Kitchen.. 1 Capsule By Mouth  Every Morning Before Meal 5)  Multivitamins  Tabs (Multiple Vitamin) .... Take 1 Tablet By Mouth Once A Day 6)  Calcium Carbonate-Vitamin D 600-400 Mg-Unit  Tabs (Calcium Carbonate-Vitamin D) .... Take 1 Tablet By Mouth Three Times A Day 7)  Triamterene-Hctz 75-50 Mg Tabs (Triamterene-Hctz) .... Take 1/2  Tablet By Mouth Once A Day 8)  Tylenol Extra Strength 500 Mg Tabs (Acetaminophen) .... Per Bottle 9)  Nitroglycerin 0.4 Mg Subl (Nitroglycerin) .... One Tablet Under Tongue Every 5 Minutes As Needed For Chest Pain---May Repeat Times Three 10)  Metoprolol Succinate 50 Mg Xr24h-Tab (Metoprolol Succinate) .... Take 1 Tablet By Mouth Once A Day 11)  Imodium A-D 2 Mg Tabs (Loperamide Hcl) .... 2 Stat Then 1 After Each Loose Stool 12)  Aspirin 325 Mg Tabs (Aspirin) .... Take 1 Tablet By Mouth Once A Day 13)  Potassium Chloride Crys Cr 20 Meq Cr-Tabs (Potassium Chloride Crys Cr) .... Take One Tablet By Mouth Daily 14)  Symbicort 160-4.5 Mcg/act Aero (Budesonide-Formoterol Fumarate) .... Inhale 2 Puffs Two Times A Day 15)  Dilt-Cd 120 Mg Xr24h-Cap (Diltiazem Hcl Coated Beads) .... Take 1 Capsule By Mouth Once A Day 16)  Flonase 50 Mcg/act Susp (Fluticasone Propionate) .... 2 Sprays Each Nostril Once Daily 17)  Levaquin 250 Mg Tabs (Levofloxacin) .... As Directed  On Hosp Discharge Sheet 18)  Prednisone 10 Mg Tabs (Prednisone) .... Taper As Directed On Hosp Discharge Sheet 19)  Saline Nasal Spray 0.65 % Soln (Saline) .... 3 Sprays Each Nostril Three Times A Day 20)  Xopenex Hfa 45 Mcg/act Aero (Levalbuterol Tartrate) .... Inhale 2 Puffs Every Four Hours As Needed  Allergies: 1)  ! Neosporin Original (Neomycin-Bacitracin-Polymyxin) 2)  ! Codeine 3)  ! Pcn 4)  ! Adhesive Tape  Vital Signs:  Patient profile:   70 year old female Menstrual status:  hysterectomy Height:      64 inches Weight:      172 pounds BMI:     29.63 Pulse rate:   81 / minute Pulse rhythm:   irregular BP sitting:   124 / 70  (left arm) Cuff size:   large  Vitals Entered By: Danielle Rankin, CMA (July 31, 2009 1:52 PM)  Physical Exam  General:  Well developed, well nourished, in no acute distress. Head:  normocephalic and atraumatic Neck:  Cannot feel. Lungs:  Mininmal ronchii.  No rales Heart:  Non-displaced PMI, chest non-tender; regular rate and rhythm, S1, S2 without murmurs, rubs or gallops. Carotid upstroke normal, no bruit. Normal abdominal aortic size, no bruits. Abdomen:  Bowel sounds positive; abdomen soft and non-tender without masses, organomegaly, or hernias noted. No hepatosplenomegaly. Extremities:  No clubbing or cyanosis. Neurologic:  Alert and oriented x 3.   X-ray  Procedure date:  07/23/2009  Findings:       Comparison: Neck CT 07/22/2009.    Findings: The thyroid echotexture is diffusely heterogeneous.  The   right lobe measures 4.7 x 2.4 x 1.6 cm.  The left lobe measures 5.7   x 3.1 x 2.1 cm.  The isthmus measures 6.4 mm.    There are several thyroid nodules bilaterally.  The nodules on the   right are similar in size and appearance, measuring 12 mm   maximally.  There is a dominant nodule inferiorly on the left which   measures 3.3 x 2.2 x 2.6 cm.  This appears solid with some   vascularity on color Doppler.    IMPRESSION:    1.  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   2.  There is a dominant solid nodule inferiorly on the left   measuring up to 3.3 cm in diameter.  Because of the size of this   nodule, malignancy cannot be excluded.  Fine-needle aspiration of   this nodule is recommended.    Read By:  Gerrianne Scale,  M.D.  X-ray  Procedure date:  07/22/2009  Findings:       IMPRESSION:   Multiple small lymph nodes throughout the neck and superior   mediastinum.  Largest right paratracheal node measures 1 cm in   diameter.  Nodes in the neck are not pathologically enlarged but to   their multiplicity could be abnormal.  These could be reactive to   some systemic process.    7 mm nodule posteriorly in the right upper lobe, not visible on the   CT scan of 2002.  Complete chest CT suggested at some point in time   for complete evaluation.  There are some other pulmonary densities   that look like scars.    Enlarged heterogeneous thyroid.  This could be multinodular goiter.   This is not completely evaluated and sonography would be suggested   for that purpose.   EKG  Procedure date:  07/31/2009  Findings:      NSR.  Possible LAE.  Impression & Recommendations:  Problem # 1:  CAD (ICD-414.00) No new symptoms Her updated medication list for this problem includes:    Nitroglycerin 0.4 Mg Subl (Nitroglycerin) ..... One tablet under tongue every 5 minutes as needed for chest pain---may repeat times three    Metoprolol Succinate 50 Mg Xr24h-tab (Metoprolol succinate) .Marland Kitchen... Take 1 tablet by mouth once a day    Aspirin 325 Mg Tabs (Aspirin) .Marland Kitchen... Take 1 tablet by mouth once a day    Dilt-cd 120 Mg Xr24h-cap (Diltiazem hcl coated beads) .Marland Kitchen... Take 1 capsule by mouth once a day  Problem # 2:  ATRIAL FIBRILLATION (ICD-427.31)  No obvious recurrence since hospitalization Her updated medication list for this problem includes:    Metoprolol Succinate 50 Mg Xr24h-tab (Metoprolol succinate)  .Marland Kitchen... Take 1 tablet by mouth once a day    Aspirin 325 Mg Tabs (Aspirin) .Marland Kitchen... Take 1 tablet by mouth once a day  Orders: EKG w/ Interpretation (93000)  Problem # 3:  THYROID NODULE (ICD-241.0)  See report of thyroid.  Needs aspiration.  Will order  Orders: Misc. Referral (Misc. Ref)  Problem # 4:  LUNG NODULE (ICD-518.89)  Abnormal CT.  Needs full CT as noted.  Has followup with Dr. Delford Field.  Orders: CT Scan  (CT Scan)  Patient Instructions: 1)  Your physician recommends that you schedule a follow-up appointment in:  6 WEEKS 2)  Your physician recommends that you continue on your current medications as directed. Please refer to the Current Medication list given to you today. 3)  You have been referred for a Thyroid Biopsy. 4)  Non-Cardiac CT scanning, (CAT scanning), is a noninvasive, special x-ray that produces cross-sectional images of the body using x-rays and a computer. CT scans help physicians diagnose and treat medical conditions. For some CT exams, a contrast material is used to enhance visibility in the area of the body being studied. CT scans provide greater clarity and reveal more details than regular x-ray exams.  Appended Document: eph reviewed and agree with plan of therapy

## 2010-07-01 NOTE — Progress Notes (Signed)
Summary: Need Clarrification  Phone Note Outgoing Call   Call placed by: Gweneth Dimitri RN,  November 28, 2009 3:31 PM Call placed to: Patient Summary of Call: At OV on 11/26/09, PW requesting pt to sign release for records from [facility] in [place], [place].  I have tried to call [facility] twice to get fax number for medical records and was told both times this pt has never been seen there before.  LMOMTCB to ask pt if this is the correct name where she was seen at in [place].  If so, did she go by a different name and if last 4 of SSN is correct in our system. Initial call taken by: Gweneth Dimitri RN,  November 28, 2009 3:33 PM  Follow-up for Phone Call        PT RETURNED CALL FROM [facility]. CALL [phone]. Tivis Ringer, CNA  November 29, 2009 9:27 AM  Called above number, was told pt has left for vacation and will not be back until July 13.  Pt's cell number [phone] , will try to reach pt on that number.  Gweneth Dimitri RN  November 29, 2009 4:38 PM   Additional Follow-up for Phone Call Additional follow up Details #1::        Called, spoke with pt.  Pt states she "thinks" it was [facility] but states since then she believes it has changed to [facility].  Last 4 of SSN correct and pt's last name was Christell Constant at time of treatment.  Pt states she will talk with her older brother to clarrify this info and will call our office back.  Gweneth Dimitri RN  November 29, 2009 4:43 PM     Additional Follow-up for Phone Call Additional follow up Details #2::    Have not heard anything back from pt yet.  ATC [facility] in [place].  Was told pt has not been treated there since at least 1990.  ATC pt's cell phone-LMOMTCB  Gweneth Dimitri RN  December 13, 2009 1:51 PM  ATC pt's home and cell numbers.  Left message at both numbers for pt to call office back. Gweneth Dimitri RN  December 19, 2009 4:35 PM  Called, spoke with pt.  Pt informed [facility] states she has not been treated there since at  least 1990.  Pt still does not know the name of where she was treated--states it was a hospital in [facility].  After googling this, found [facility] in [place]-pt now stating this is the correct hospital.  Will attempt to get records from there.  Gweneth Dimitri RN  December 20, 2009 4:52 PM   Additional Follow-up for Phone Call Additional follow up Details #3:: Details for Additional Follow-up Action Taken: [facility].  Was told they do have records from the time frame we are looking for.  Will fax release of info to them at [phone] and await records.  Gweneth Dimitri RN  December 20, 2009 4:59 PM

## 2010-07-01 NOTE — Assessment & Plan Note (Signed)
Summary: f35m   Visit Type:  4 months follow up Primary Provider:  Judithann Sheen MD  CC:  No complaints.  History of Present Illness: Concerned about her findings.  She is doing well.  No chest pain.  She was down at her brother's house, and she had to climb to the third floor.    Current Medications (verified): 1)  Boniva 150 Mg  Tabs (Ibandronate Sodium) .... Take 1 Tab By Mouth Once A Month 2)  Phenobarbital 60 Mg  Tabs (Phenobarbital) .... Take 1 Tablet Twice A Day 3)  Crestor 20 Mg Tabs (Rosuvastatin Calcium) .... Take 1 Tab By Mouth At Bedtime 4)  Prevacid 15 Mg Cpdr (Lansoprazole) .Marland Kitchen.. 1 Capsule By Mouth  Every Morning Before Meal 5)  Multivitamins  Tabs (Multiple Vitamin) .... Take 1 Tablet By Mouth Once A Day 6)  Calcium Carbonate-Vitamin D 600-400 Mg-Unit  Tabs (Calcium Carbonate-Vitamin D) .... Take 1 Tablet By Mouth Three Times A Day 7)  Triamterene-Hctz 75-50 Mg Tabs (Triamterene-Hctz) .... Take 1/2  Tablet By Mouth Once A Day 8)  Tylenol Extra Strength 500 Mg Tabs (Acetaminophen) .... Per Bottle 9)  Nitroglycerin 0.4 Mg Subl (Nitroglycerin) .... One Tablet Under Tongue Every 5 Minutes As Needed For Chest Pain---May Repeat Times Three 10)  Metoprolol Succinate 50 Mg Xr24h-Tab (Metoprolol Succinate) .... Take 1 Tablet  Once Daily 11)  Imodium A-D 2 Mg Tabs (Loperamide Hcl) .... 2 Stat Then 1 After Each Loose Stool As Needed 12)  Aspirin 325 Mg Tabs (Aspirin) .... Take 1 Tablet By Mouth Once A Day 13)  Potassium Chloride Crys Cr 20 Meq Cr-Tabs (Potassium Chloride Crys Cr) .Marland Kitchen.. 1 Tab Once Daily 14)  Symbicort 160-4.5 Mcg/act Aero (Budesonide-Formoterol Fumarate) .... Inhale 2 Puffs Two Times A Day 15)  Dilt-Cd 120 Mg Xr24h-Cap (Diltiazem Hcl Coated Beads) .... Take 1 Capsule By Mouth Once A Day 16)  Flonase 50 Mcg/act Susp (Fluticasone Propionate) .... 2 Sprays Each Nostril Once Daily 17)  Saline Nasal Spray 0.65 % Soln (Saline) .... 3 Sprays Each Nostril Three Times  A Day 18)  Diclofenac Sodium 75 Mg Tbec (Diclofenac Sodium) .Marland Kitchen.. 1 Two Times A Day Pc For Arthritis 19)  Flexeril 10 Mg Tabs (Cyclobenzaprine Hcl) .Marland Kitchen.. 1 Morn,  Midafternoon and Hs For Muscle Spasm 20)  Vitamin C .... Once Daily 21)  Hepa Filter Mask .... Use As Directed  Allergies: 1)  ! Neosporin Original (Neomycin-Bacitracin-Polymyxin) 2)  ! Codeine 3)  ! Pcn 4)  ! Adhesive Tape  Vital Signs:  Patient profile:   70 year old female Menstrual status:  hysterectomy Height:      64 inches Weight:      182.75 pounds BMI:     31.48 Pulse rate:   86 / minute Pulse rhythm:   regular Resp:     18 per minute BP sitting:   111 / 60  (left arm) Cuff size:   large  Vitals Entered By: Vikki Ports (March 19, 2010 11:25 AM)  Physical Exam  General:  Well developed, well nourished, in no acute distress. Head:  normocephalic and atraumatic Eyes:  PERRLA/EOM intact; conjunctiva and lids normal. Lungs:  Clear bilaterally to auscultation and percussion. Heart:  PMI non displaced.  NOrmal S1 and S2.  No murmur rub or gallop.  Pulses:  pulses normal in all 4 extremities Extremities:  No clubbing or cyanosis. Neurologic:  Alert and oriented x 3.   EKG  Procedure date:  03/19/2010  Findings:      NSR.  Non specific T abnormality.    Impression & Recommendations:  Problem # 1:  CAD (ICD-414.00) No chest pain at present.  Stable at present time.  No recurrent symptoms.  Continue medical therapy. Her updated medication list for this problem includes:    Nitroglycerin 0.4 Mg Subl (Nitroglycerin) ..... One tablet under tongue every 5 minutes as needed for chest pain---may repeat times three    Metoprolol Succinate 50 Mg Xr24h-tab (Metoprolol succinate) .Marland Kitchen... Take 1 tablet  once daily    Aspirin 325 Mg Tabs (Aspirin) .Marland Kitchen... Take 1 tablet by mouth once a day    Dilt-cd 120 Mg Xr24h-cap (Diltiazem hcl coated beads) .Marland Kitchen... Take 1 capsule by mouth once a day  Problem # 2:  LUNG NODULE  (ZOX-096.04) Seeing Dr. Delford Field.  Encouraged her to make an appointment.   Problem # 3:  HYPERLIPIDEMIA (ICD-272.4) recheck lipid and liver Her updated medication list for this problem includes:    Crestor 20 Mg Tabs (Rosuvastatin calcium) .Marland Kitchen... Take 1 tab by mouth at bedtime  Other Orders: EKG w/ Interpretation (93000)  Patient Instructions: 1)  Your physician recommends that you return for a FASTING (Nothing to eat or drink after midnight) Lipid, Liver, BMP, Phenobarbital level and TSH 2)  Your physician recommends that you continue on your current medications as directed. Please refer to the Current Medication list given to you today. 3)  Your physician wants you to follow-up in:   6 MONTHS. You will receive a reminder letter in the mail two months in advance. If you don't receive a letter, please call our office to schedule the follow-up appointment. Prescriptions: NITROGLYCERIN 0.4 MG SUBL (NITROGLYCERIN) One tablet under tongue every 5 minutes as needed for chest pain---may repeat times three  #25 x 2   Entered by:   Julieta Gutting, RN, BSN   Authorized by:   Ronaldo Miyamoto, MD, [facility]   Signed by:   Julieta Gutting, RN, BSN on 03/19/2010   Method used:   Electronically to        [facility]. [phone]* (retail)       [address]       [place], [place]  [address]       Ph: [phone]       Fax: [phone]   RxID:   [id number]

## 2010-07-01 NOTE — Assessment & Plan Note (Signed)
Summary: PER CHECK OUT/SF   Visit Type:  Follow-up   History of Present Illness: Patient had fine needle aspiration of her thyroid, and had a non neoplastic goiter.  Has seen Dr. Scotty Court.  CT scan results reviewed with patient.  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  Denies current cough or other symptoms.  No chest pain at present.  Actually getting alon quite well.    Current Medications (verified): 1)  Boniva 150 Mg  Tabs (Ibandronate Sodium) .... Take 1 Tab By Mouth Once A Month 2)  Phenobarbital 60 Mg  Tabs (Phenobarbital) .... Take 1 Tablet Twice A Day 3)  Crestor 20 Mg Tabs (Rosuvastatin Calcium) .... Take 1 Tab By Mouth At Bedtime 4)  Prevacid 15 Mg Cpdr (Lansoprazole) .Marland Kitchen.. 1 Capsule By Mouth  Every Morning Before Meal 5)  Multivitamins  Tabs (Multiple Vitamin) .... Take 1 Tablet By Mouth Once A Day 6)  Calcium Carbonate-Vitamin D 600-400 Mg-Unit  Tabs (Calcium Carbonate-Vitamin D) .... Take 1 Tablet By Mouth Three Times A Day 7)  Triamterene-Hctz 75-50 Mg Tabs (Triamterene-Hctz) .... Take 1/2  Tablet By Mouth Once A Day 8)  Tylenol Extra Strength 500 Mg Tabs (Acetaminophen) .... Per Bottle 9)  Nitroglycerin 0.4 Mg Subl (Nitroglycerin) .... One Tablet Under Tongue Every 5 Minutes As Needed For Chest Pain---May Repeat Times Three 10)  Metoprolol Succinate 50 Mg Xr24h-Tab (Metoprolol Succinate) .... Take 1 Tablet  Once Daily 11)  Imodium A-D 2 Mg Tabs (Loperamide Hcl) .... 2 Stat Then 1 After Each Loose Stool As Needed 12)  Aspirin 325 Mg Tabs (Aspirin) .... Take 1 Tablet By Mouth Once A Day 13)  Potassium Chloride Crys Cr 20 Meq Cr-Tabs (Potassium Chloride Crys Cr) .... Take One Tablet By Mouth Daily 14)  Symbicort 160-4.5 Mcg/act Aero (Budesonide-Formoterol Fumarate) .... Inhale 2 Puffs Two Times A Day 15)  Dilt-Cd 120 Mg Xr24h-Cap (Diltiazem Hcl Coated Beads) .... Take 1 Capsule By Mouth Once A Day 16)  Flonase 50 Mcg/act Susp (Fluticasone  Propionate) .... 2 Sprays Each Nostril Once Daily 17)  Saline Nasal Spray 0.65 % Soln (Saline) .... 3 Sprays Each Nostril Three Times A Day 18)  Proair Hfa 108 (90 Base) Mcg/act Aers (Albuterol Sulfate) .... 2 Puffs Every Four Hours As Needed  Allergies: 1)  ! Neosporin Original (Neomycin-Bacitracin-Polymyxin) 2)  ! Codeine 3)  ! Pcn 4)  ! Adhesive Tape  Past History:  Past Medical History: Last updated: 01/21/2009 Current Problems:  CAD (ICD-414.00) CHEST PAIN (ICD-786.50) HYPERLIPIDEMIA (ICD-272.4) CERUMEN IMPACTION, RIGHT (ICD-380.4) CHOLELITHIASIS (ICD-574.20) IRRITABLE BOWEL SYNDROME (ICD-564.1) ABDOMINAL PAIN (ICD-789.00) GERD (ICD-530.81) TINNITUS, RIGHT (ICD-388.30) SCREENING FOR MALIGNANT NEOPLASM OF THE CERVIX (ICD-V76.2) SEIZURE DISORDER (ICD-780.39) HYPOTHYROIDISM (ICD-244.9) ANEMIA (ICD-285.9) UNS ADVRS EFF OTH RX MEDICINAL&BIOLOGICAL SBSTNC (EAV-409.81) UTI (ICD-599.0) ALLERGIC RHINITIS (ICD-477.9) EDEMA- LOCALIZED (ICD-782.3) MUSCLE SPASM (ICD-728.85) DEPRESSION, ACUTE (ICD-296.23) ARTHRITIS, BACK (ICD-721.90) EPILEPSY, 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   No children. never smoked social alcohol still working: Producer, television/film/video at [employer]  Vital Signs:  Patient profile:   70 year old female Menstrual status:  hysterectomy Height:      64 inches Weight:      174.25 pounds BMI:     30.02 Pulse rate:  80 / minute Pulse rhythm:   regular Resp:     18 per minute BP sitting:   118 / 60  (left arm) Cuff size:   large  Vitals Entered By: Vikki Ports (September 17, 2009 10:59 AM)  Physical Exam  General:  Well developed, well nourished, in no acute distress. Head:  normocephalic and  atraumatic Eyes:  PERRLA/EOM intact; conjunctiva and lids normal. Lungs:  Clear bilaterally to auscultation and percussion. Heart:  PMI non displaced.  Normal S1 and S2.  Without miurmur.  Abdomen:  Bowel sounds positive; abdomen soft and non-tender without masses, organomegaly, or hernias noted. No hepatosplenomegaly. Msk:  Back normal, normal gait. Muscle strength and tone normal. Pulses:  pulses normal in all 4 extremities Extremities:  No clubbing or cyanosis. Neurologic:  Alert and oriented x 3.   CT Scan  Procedure date:  08/19/2009  Findings:      IMPRESSION:   1.  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 2.  Thyroid nodules.  Please refer to thyroid ultrasound and biopsy performed 07/23/2009 and 08/13/2009, respectively.   Read By:  Reyes Ivan.,  M.D.      Impression & Recommendations:  Problem # 1:  ATRIAL FIBRILLATION (ICD-427.31)  No obvious recurrence.  Rate now controlled. On low dose dilt at present.  Her updated medication list for this problem includes:    Metoprolol Succinate 50 Mg Xr24h-tab (Metoprolol succinate) .Marland Kitchen... Take 1 tablet  once daily    Aspirin 325 Mg Tabs (Aspirin) .Marland Kitchen... Take 1 tablet by mouth once a day  Orders: EKG w/ Interpretation (93000)  Problem # 2:  CAD (ICD-414.00) No current symptoms.  Her updated medication list for this problem includes:    Nitroglycerin 0.4 Mg Subl (Nitroglycerin) ..... One tablet under tongue every 5 minutes as needed for chest pain---may repeat times three    Metoprolol Succinate 50 Mg Xr24h-tab (Metoprolol succinate) .Marland Kitchen... Take 1 tablet  once daily    Aspirin 325 Mg Tabs (Aspirin) .Marland Kitchen... Take 1 tablet by mouth once a day    Dilt-cd 120 Mg Xr24h-cap (Diltiazem hcl coated beads) .Marland Kitchen... Take 1 capsule by mouth once a day  Problem # 3:  HYPERLIPIDEMIA (ICD-272.4) tolerating without difficulty.  Will need  repeat lipid and liver profile.  Her updated medication list for this problem includes:    Crestor 20 Mg Tabs (Rosuvastatin calcium) .Marland Kitchen... Take 1 tab by mouth at bedtime  Problem # 4:  EXTRINSIC ASTHMA, UNSPECIFIED (ICD-493.00) followed by Dr. Delford Field.  On multiple meds.  We called is office to notify him of her CT findings, and he felt they need no further workup.   Her updated medication list for this problem includes:    Symbicort 160-4.5 Mcg/act Aero (Budesonide-formoterol fumarate) ..... Inhale 2 puffs two times a day    Proair Hfa 108 (90 Base) Mcg/act Aers (Albuterol sulfate) .Marland Kitchen... 2 puffs every four hours as needed  Patient Instructions: 1)  Your physician recommends that you schedule a follow-up appointment in: 2 months.  2)  Your physician recommends that you return for a FASTING lipid profile: Lipid and Liver Profile. 272.0  Appended Document: PER CHECK OUT/SF ECG  Normal Rhythm. leftward axis.  Possible LAE.  Borderline low voltage.  TS

## 2010-07-01 NOTE — Assessment & Plan Note (Signed)
Summary: eph 1-2 weeks/mt   Primary Provider:  Judithann Sheen MD   History of Present Illness: The patient is a 70 year old female, with history of nonobstructive coronary artery disease, who presented to the emergency room 03/23/2010 with new-onset, paroxysmal atrial fibrillation with RVR. She converted to NSR with IV Dilt and IV Dig.  She was maintained on Diltiazem 180 and Toprol 50 at discharge.  She was not felt to be a coumadin candidate and maintained on ASA 325.  She ruled out for MI.  Her TSH was normal.  She had an increase in her creatinine that improved prior to discharge.  Since discharge, The patient denies chest pain, dyspnea, orthopnea, PND, pedal edema or syncope.  She denies recurrent palps.  Current Medications (verified): 1)  Boniva 150 Mg  Tabs (Ibandronate Sodium) .... Take 1 Tab By Mouth Once A Month 2)  Phenobarbital 60 Mg  Tabs (Phenobarbital) .... Take 1 Tablet Twice A Day 3)  Crestor 20 Mg Tabs (Rosuvastatin Calcium) .... Take 1 Tab By Mouth At Bedtime 4)  Prevacid 15 Mg Cpdr (Lansoprazole) .Marland Kitchen.. 1 Capsule By Mouth  Every Morning Before Meal 5)  Multivitamins  Tabs (Multiple Vitamin) .... Take 1 Tablet By Mouth Once A Day 6)  Calcium Carbonate-Vitamin D 600-400 Mg-Unit  Tabs (Calcium Carbonate-Vitamin D) .... Take 1 Tablet By Mouth Three Times A Day 7)  Triamterene-Hctz 37.5-25 Mg Tabs (Triamterene-Hctz) .... 1/2 Tablet Once Daily 8)  Tylenol Extra Strength 500 Mg Tabs (Acetaminophen) .... Per Bottle 9)  Nitroglycerin 0.4 Mg Subl (Nitroglycerin) .... One Tablet Under Tongue Every 5 Minutes As Needed For Chest Pain---May Repeat Times Three 10)  Metoprolol Succinate 50 Mg Xr24h-Tab (Metoprolol Succinate) .... Take 1 Tablet  Once Daily 11)  Imodium A-D 2 Mg Tabs (Loperamide Hcl) .... 2 Stat Then 1 After Each Loose Stool As Needed 12)  Aspirin 325 Mg Tabs (Aspirin) .... Take 1 Tablet By Mouth Once A Day 13)  Potassium Chloride Crys Cr 20 Meq Cr-Tabs (Potassium  Chloride Crys Cr) .Marland Kitchen.. 1 Tab Once Daily 14)  Qvar 40 Mcg/act  Aers (Beclomethasone Dipropionate) .... Two Puffs Twice Daily 15)  Dilt-Cd 180 Mg Xr24h-Cap (Diltiazem Hcl Coated Beads) .... Take One Capsule Daily 16)  Flonase 50 Mcg/act Susp (Fluticasone Propionate) .... 2 Sprays Each Nostril Once Daily 17)  Saline Nasal Spray 0.65 % Soln (Saline) .... 3 Sprays Each Nostril Three Times A Day 18)  Diclofenac Sodium 75 Mg Tbec (Diclofenac Sodium) .Marland Kitchen.. 1 Two Times A Day Pc For Arthritis 19)  Flexeril 10 Mg Tabs (Cyclobenzaprine Hcl) .Marland Kitchen.. 1 Morn,  Midafternoon and Hs For Muscle Spasm 20)  Vitamin C .... Once Daily 21)  Hepa Filter Mask .... Use As Directed 22)  Proair Hfa 108 (90 Base) Mcg/act Aers (Albuterol Sulfate) .... 2 Puffs Every 4 Hour As Needed  Allergies (verified): 1)  ! Neosporin Original (Neomycin-Bacitracin-Polymyxin) 2)  ! Codeine 3)  ! Pcn 4)  ! Adhesive Tape  Past History:  Past Medical History: Last updated: 03/25/2010 Current Problems:  CAD (ICD-414.00) CHEST PAIN (ICD-786.50) HYPERLIPIDEMIA (ICD-272.4) CERUMEN IMPACTION, RIGHT (ICD-380.4) CHOLELITHIASIS (ICD-574.20) IRRITABLE BOWEL SYNDROME (ICD-564.1) ABDOMINAL PAIN (ICD-789.00) GERD (ICD-530.81) TINNITUS, RIGHT (ICD-388.30) SCREENING FOR MALIGNANT NEOPLASM OF THE CERVIX (ICD-V76.2) SEIZURE DISORDER (ICD-780.39) HYPOTHYROIDISM (ICD-244.9) ANEMIA (ICD-285.9) UNS ADVRS EFF OTH RX MEDICINAL&BIOLOGICAL SBSTNC (UYQ-034.74) UTI (ICD-599.0) ALLERGIC RHINITIS (ICD-477.9) EDEMA- LOCALIZED (ICD-782.3) MUSCLE SPASM (ICD-728.85) DEPRESSION, ACUTE (ICD-296.23) ARTHRITIS, BACK (ICD-721.90) EPILEPSY, 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 if cultured or empiric      -[place] Atrial Fibrillation    -Nonsustained.  Recurrent 10/22- 03/23/10  Review of Systems       c/o back pain.  Otherwise, all other systems reviewed and negative.   Vital  Signs:  Patient profile:   70 year old female Menstrual status:  hysterectomy Height:      64 inches Weight:      179 pounds BMI:     30.84 Pulse rate:   74 / minute BP sitting:   110 / 70  (right arm)  Vitals Entered By: Laurance Flatten CMA (April 01, 2010 10:01 AM)  Physical Exam  General:  Well nourished, well developed, in no acute distress HEENT: normal Neck: no JVD Cardiac:  normal S1, S2; RRR; no murmur Lungs:  clear to auscultation bilaterally, no wheezing, rhonchi or rales Abd: soft, nontender, no hepatomegaly Ext: no clubbing, cyanosis or edema Vascular: no carotid  bruits Skin: warm and dry    EKG  Procedure date:  04/01/2010  Findings:      Normal sinus rhythm with rate of:  74 left axis NSSTTW changes   Impression & Recommendations:  Problem # 1:  ATRIAL FIBRILLATION (ICD-427.31) Maintaining NSR. Continue on Dilt and Toprol and ASA.     Her updated medication list for this problem includes:    Metoprolol Succinate 50 Mg Xr24h-tab (Metoprolol succinate) .Marland Kitchen... Take 1 tablet  once daily    Aspirin 325 Mg Tabs (Aspirin) .Marland Kitchen... Take 1 tablet by mouth once a day  Problem # 2:  RENAL INSUFFICIENCY (ICD-588.9)  check BMP today d/c creatinine was 1.2  Orders: TLB-BMP (Basic Metabolic Panel-BMET) (80048-METABOL)  Patient Instructions: 1)  Your physician recommends that you schedule a follow-up appointment in: 2 to 3 months with Dr. [name] 2)  Your physician recommends that you return for lab work in: Today BMET. 3)  Your physician recommends that you continue on your current medications as directed. Please refer to the Current Medication list given to you today.

## 2010-07-01 NOTE — Letter (Signed)
Summary: [facility] Instructions  [facility]  [address] [place], [place] [address]   Phone: [phone]  Fax: [phone]       AAYLAH POKORNY    Feb 16, 1954    MRN: [id number]        Procedure Day /Date: 06/18/09  TUESDAY     Arrival Time:  2:00PM     Procedure Time:  3:00PM     Location of Procedure:                    _ X_  [facility] (4th Floor)                        PREPARATION FOR COLONOSCOPY WITH MOVIPREP   Starting 5 days prior to your procedure 06/13/09 do not eat nuts, seeds, popcorn, corn, beans, peas,  salads, or any raw vegetables.  Do not take any fiber supplements (e.g. Metamucil, Citrucel, and Benefiber).  THE DAY BEFORE YOUR PROCEDURE         DATE: 06/17/09  EUM:PNTIRW  1.  Drink clear liquids the entire day-NO SOLID FOOD  2.  Do not drink anything colored red or purple.  Avoid juices with pulp.  No orange juice.  3.  Drink at least 64 oz. (8 glasses) of fluid/clear liquids during the day to prevent dehydration and help the prep work efficiently.  CLEAR LIQUIDS INCLUDE: Water Jello Ice Popsicles Tea (sugar ok, no milk/cream) Powdered fruit flavored drinks Coffee (sugar ok, no milk/cream) Gatorade Juice: apple, white grape, white cranberry  Lemonade Clear bullion, consomm, broth Carbonated beverages (any kind) Strained chicken noodle soup Hard Candy                             4.  In the morning, mix first dose of MoviPrep solution:    Empty 1 Pouch A and 1 Pouch B into the disposable container    Add lukewarm drinking water to the top line of the container. Mix to dissolve    Refrigerate (mixed solution should be used within 24 hrs)  5.  Begin drinking the prep at 5:00 p.m. The MoviPrep container is divided by 4 marks.   Every 15 minutes drink the solution down to the next mark (approximately 8 oz) until the full liter is complete.   6.  Follow completed prep with 16 oz of clear liquid of your choice (Nothing  red or purple).  Continue to drink clear liquids until bedtime.  7.  Before going to bed, mix second dose of MoviPrep solution:    Empty 1 Pouch A and 1 Pouch B into the disposable container    Add lukewarm drinking water to the top line of the container. Mix to dissolve    Refrigerate  THE DAY OF YOUR PROCEDURE      DATE: 06/18/09 DAY: TUESDAY  Beginning at10:00a.m. (5 hours before procedure):         1. Every 15 minutes, drink the solution down to the next mark (approx 8 oz) until the full liter is complete.  2. Follow completed prep with 16 oz. of clear liquid of your choice.    3. You may drink clear liquids until 1:00PM (2 HOURS BEFORE PROCEDURE).   MEDICATION INSTRUCTIONS  Unless otherwise instructed, you should take regular prescription medications with a small sip of water   as early as possible the morning of your procedure.  Additional medication instructions: Hold Triamterene/HCTZ the morning of procedure.  Be sure to take your Metoprolol the morning of procedure.         OTHER INSTRUCTIONS  You will need a responsible adult at least 69 years of age to accompany you and drive you home.   This person must remain in the waiting room during your procedure.  Wear loose fitting clothing that is easily removed.  Leave jewelry and other valuables at home.  However, you may wish to bring a book to read or  an iPod/MP3 player to listen to music as you wait for your procedure to start.  Remove all body piercing jewelry and leave at home.  Total time from sign-in until discharge is approximately 2-3 hours.  You should go home directly after your procedure and rest.  You can resume normal activities the  day after your procedure.  The day of your procedure you should not:   Drive   Make legal decisions   Operate machinery   Drink alcohol   Return to work  You will receive specific instructions about eating, activities and medications before you  leave.    The above instructions have been reviewed and explained to me by   _______________________    I fully understand and can verbalize these instructions _____________________________ Date _________

## 2010-07-01 NOTE — Miscellaneous (Signed)
Summary: [facility] Previsit/prep  Clinical Lists Changes  Medications: Added new medication of MOVIPREP 100 GM  SOLR (PEG-KCL-NACL-NASULF-NA ASC-C) As per prep instructions. - Signed Rx of MOVIPREP 100 GM  SOLR (PEG-KCL-NACL-NASULF-NA ASC-C) As per prep instructions.;  #1 x 0;  Signed;  Entered by: Wyona Almas RN;  Authorized by: Hilarie Fredrickson MD;  Method used: Electronically to [facility]. [phone]*, [address], [place], [place], [place]  [address], Ph: [phone], Fax: [phone] Observations: Added new observation of ALLERGY REV: Done (06/05/2009 11:00)    Prescriptions: MOVIPREP 100 GM  SOLR (PEG-KCL-NACL-NASULF-NA ASC-C) As per prep instructions.  #1 x 0   Entered by:   Wyona Almas RN   Authorized by:   Hilarie Fredrickson MD   Signed by:   Wyona Almas RN on 06/05/2009   Method used:   Electronically to        [facility]. [phone]* (retail)       [address]       [place], [place]  [address]       Ph: [phone]       Fax: [phone]   RxID:   [id number]

## 2010-07-01 NOTE — Assessment & Plan Note (Signed)
Summary: Pulmonary OV   Primary Provider/Referring Provider:  Judithann Sheen MD  CC:  3 month follow up.  Pt states breathing is doing well overall.  Denies SOB, wheezing, chest tightness, and cough.  .  History of Present Illness: 70 yo female with known hx of  asthmatic bronchitis, abn CXR with LLL scar,  ?  MAC ? TB in past.    November 26, 2009 3:11 PM Since 3/11: the pt notes  dyspnea and cough are better.  Not clearing throat as much.  The pt saw [facility] one week ago.     There is a ? of TB dx  years ago in early 2000.  The pt was living in [place] and was  rx with abx for 6months.  This was  Supervised intermittent.rx. This sounded like rx for active TB.  No records here. The pt was in [place], [employer].    Pt denies any significant sore throat, nasal congestion or excess secretions, fever, chills, sweats, unintended weight loss, pleurtic or exertional chest pain, orthopnea PND, or leg swelling Pt denies any increase in rescue therapy over baseline, denies waking up needing it or having any early am or nocturnal exacerbations of coughing/wheezing/or dyspnea.     Asthma History    Initial Asthma Severity Rating:    Age range: 12+ years    Symptoms: 0-2 days/week    Nighttime Awakenings: 0-2/month    Interferes w/ normal activity: no limitations    SABA use (not for EIB): 0-2 days/week    Exacerbations requiring oral systemic steroids: 0-1/year    Asthma Severity Assessment: Intermittent  Current Medications (verified): 1)  Boniva 150 Mg  Tabs (Ibandronate Sodium) .... Take 1 Tab By Mouth Once A Month 2)  Phenobarbital 60 Mg  Tabs (Phenobarbital) .... Take 1 Tablet Twice A Day 3)  Crestor 20 Mg Tabs (Rosuvastatin Calcium) .... Take 1 Tab By Mouth At Bedtime 4)  Prevacid 15 Mg Cpdr (Lansoprazole) .Marland Kitchen.. 1 Capsule By Mouth  Every Morning Before Meal 5)  Multivitamins  Tabs (Multiple Vitamin) .... Take 1 Tablet By Mouth Once A Day 6)  Calcium Carbonate-Vitamin D 600-400  Mg-Unit  Tabs (Calcium Carbonate-Vitamin D) .... Take 1 Tablet By Mouth Three Times A Day 7)  Triamterene-Hctz 75-50 Mg Tabs (Triamterene-Hctz) .... Take 1/2  Tablet By Mouth Once A Day 8)  Tylenol Extra Strength 500 Mg Tabs (Acetaminophen) .... Per Bottle 9)  Nitroglycerin 0.4 Mg Subl (Nitroglycerin) .... One Tablet Under Tongue Every 5 Minutes As Needed For Chest Pain---May Repeat Times Three 10)  Metoprolol Succinate 50 Mg Xr24h-Tab (Metoprolol Succinate) .... Take 1 Tablet  Once Daily 11)  Imodium A-D 2 Mg Tabs (Loperamide Hcl) .... 2 Stat Then 1 After Each Loose Stool As Needed 12)  Aspirin 325 Mg Tabs (Aspirin) .... Take 1 Tablet By Mouth Once A Day 13)  Potassium Chloride Crys Cr 20 Meq Cr-Tabs (Potassium Chloride Crys Cr) .Marland Kitchen.. 1 Tab Once Daily 14)  Symbicort 160-4.5 Mcg/act Aero (Budesonide-Formoterol Fumarate) .... Inhale 2 Puffs Two Times A Day 15)  Dilt-Cd 120 Mg Xr24h-Cap (Diltiazem Hcl Coated Beads) .... Take 1 Capsule By Mouth Once A Day 16)  Flonase 50 Mcg/act Susp (Fluticasone Propionate) .... 2 Sprays Each Nostril Once Daily 17)  Saline Nasal Spray 0.65 % Soln (Saline) .... 3 Sprays Each Nostril Three Times A Day 18)  Proair Hfa 108 (90 Base) Mcg/act Aers (Albuterol Sulfate) .... 2 Puffs Every Four Hours As Needed 19)  Diclofenac Sodium  75 Mg Tbec (Diclofenac Sodium) .Marland Kitchen.. 1 Two Times A Day Pc For Arthritis 20)  Flexeril 10 Mg Tabs (Cyclobenzaprine Hcl) .Marland Kitchen.. 1 Morn,  Midafternoon and Hs For Muscle Spasm 21)  Vitamin C .... Once Daily  Allergies (verified): 1)  ! Neosporin Original (Neomycin-Bacitracin-Polymyxin) 2)  ! Codeine 3)  ! Pcn 4)  ! Adhesive Tape  Past History:  Past medical, surgical, family and social histories (including risk factors) reviewed, and no changes noted (except as noted below).  Past Medical History: Current Problems:  CAD (ICD-414.00) CHEST PAIN (ICD-786.50) HYPERLIPIDEMIA (ICD-272.4) CERUMEN IMPACTION, RIGHT (ICD-380.4) CHOLELITHIASIS  (ICD-574.20) IRRITABLE BOWEL SYNDROME (ICD-564.1) ABDOMINAL PAIN (ICD-789.00) GERD (ICD-530.81) TINNITUS, RIGHT (ICD-388.30) SCREENING FOR MALIGNANT NEOPLASM OF THE CERVIX (ICD-V76.2) SEIZURE DISORDER (ICD-780.39) HYPOTHYROIDISM (ICD-244.9) ANEMIA (ICD-285.9) UNS ADVRS EFF OTH RX MEDICINAL&BIOLOGICAL SBSTNC (SWF-093.23) UTI (ICD-599.0) ALLERGIC RHINITIS (ICD-477.9) EDEMA- LOCALIZED (ICD-782.3) MUSCLE SPASM (ICD-728.85) DEPRESSION, ACUTE (ICD-296.23) ARTHRITIS, BACK (ICD-721.90) EPILEPSY, 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 if cultured or empiric      -[place], [place].  Past Surgical History: Reviewed history from 01/21/2009 and no changes required. Hysterectomy  1985 L wrist fx with plates  [id number]  Family History: Reviewed history from 04/10/2009 and no changes required. M had angina F had MI, CHF  Social History: Reviewed history from 07/31/2009 and no changes required. divorced.   No children. never smoked social alcohol still working: Producer, television/film/video at [employer]  Review of Systems       The patient complains of shortness of breath with activity and non-productive cough.  The patient denies shortness of breath at rest, productive cough, coughing up blood, chest pain, irregular heartbeats, acid heartburn, indigestion, loss of appetite, weight change, abdominal pain, difficulty swallowing, sore throat, tooth/dental problems, headaches, nasal congestion/difficulty breathing through nose, sneezing, itching, ear ache, anxiety, depression, hand/feet swelling, joint stiffness or pain, rash, change in color of mucus, and fever.    Vital Signs:  Patient profile:   70 year old female Menstrual status:  hysterectomy Height:      64 inches Weight:      178 pounds BMI:     30.66 O2 Sat:      96 % on Room air Temp:     98.1 degrees F oral Pulse rate:   83 / minute BP sitting:   122 / 62  (right arm) Cuff  size:   regular  Vitals Entered By: Gweneth Dimitri RN (November 26, 2009 3:54 PM)  O2 Flow:  Room air CC: 3 month follow up.  Pt states breathing is doing well overall.  Denies SOB, wheezing, chest tightness, cough.   Comments Medications reviewed with patient Daytime contact number verified with patient. Gweneth Dimitri RN  November 26, 2009 3:56 PM    Physical Exam  Additional Exam:  GEN: A/Ox3; pleasant , NAD HEENT:  [place]/AT, , EACs-clear, TMs-wnl, NOSE-clear, THROAT-clear NECK:  Supple w/ fair ROM; no JVD; normal carotid impulses w/o bruits; no thyromegaly or nodules palpated; no lymphadenopathy. RESP  Clear to P & A; w/o, wheezes/ rales/ or rhonchi. CARD:  RRR, no m/r/g   GI:   Soft & nt; nml bowel sounds; no organomegaly or masses detected. Musco: Warm bil,  no calf tenderness edema, clubbing, pulses intact Neuro: Intact w/ no focal deficits noted.    Impression & Recommendations:  Problem # 1:  EXTRINSIC ASTHMA, UNSPECIFIED (ICD-493.00) Assessment Improved Stable asthma no active airway inflammation at this time, sinusitis drove last exacerbation  earlier this year plan No change in inhaled medications.   Maintain treatment program as currently prescribed. 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 TB.  I doubt active infection now. I doubt current findings represent active MAC infection plan try to obtain old records from [place], [employer].  Medications Added to Medication List This Visit: 1)  Potassium Chloride Crys Cr 20 Meq Cr-tabs (Potassium chloride crys cr) .Marland Kitchen.. 1 tab once daily 2)  Vitamin C  .... Once daily 3)  Hepa Filter Mask  .... Use as directed  Complete Medication List: 1)  Boniva 150 Mg Tabs (Ibandronate sodium) .... Take 1 tab by mouth once a month 2)  Phenobarbital 60 Mg Tabs (Phenobarbital) .... Take 1 tablet twice a day 3)  Crestor 20 Mg Tabs  (Rosuvastatin calcium) .... Take 1 tab by mouth at bedtime 4)  Prevacid 15 Mg Cpdr (Lansoprazole) .Marland Kitchen.. 1 capsule by mouth  every morning before meal 5)  Multivitamins Tabs (Multiple vitamin) .... Take 1 tablet by mouth once a day 6)  Calcium Carbonate-vitamin D 600-400 Mg-unit Tabs (Calcium carbonate-vitamin d) .... Take 1 tablet by mouth three times a day 7)  Triamterene-hctz 75-50 Mg Tabs (Triamterene-hctz) .... Take 1/2  tablet by mouth once a day 8)  Tylenol Extra Strength 500 Mg Tabs (Acetaminophen) .... Per bottle 9)  Nitroglycerin 0.4 Mg Subl (Nitroglycerin) .... One tablet under tongue every 5 minutes as needed for chest pain---may repeat times three 10)  Metoprolol Succinate 50 Mg Xr24h-tab (Metoprolol succinate) .... Take 1 tablet  once daily 11)  Imodium A-d 2 Mg Tabs (Loperamide hcl) .... 2 stat then 1 after each loose stool as needed 12)  Aspirin 325 Mg Tabs (Aspirin) .... Take 1 tablet by mouth once a day 13)  Potassium Chloride Crys Cr 20 Meq Cr-tabs (Potassium chloride crys cr) .Marland Kitchen.. 1 tab once daily 14)  Symbicort 160-4.5 Mcg/act Aero (Budesonide-formoterol fumarate) .... Inhale 2 puffs two times a day 15)  Dilt-cd 120 Mg Xr24h-cap (Diltiazem hcl coated beads) .... Take 1 capsule by mouth once a day 16)  Flonase 50 Mcg/act Susp (Fluticasone propionate) .... 2 sprays each nostril once daily 17)  Saline Nasal Spray 0.65 % Soln (Saline) .... 3 sprays each nostril three times a day 18)  Proair Hfa 108 (90 Base) Mcg/act Aers (Albuterol sulfate) .... 2 puffs every four hours as needed 19)  Diclofenac Sodium 75 Mg Tbec (Diclofenac sodium) .Marland Kitchen.. 1 two times a day pc for arthritis 20)  Flexeril 10 Mg Tabs (Cyclobenzaprine hcl) .Marland Kitchen.. 1 morn,  midafternoon and hs for muscle spasm 21)  Vitamin C  .... Once daily 22)  Hepa Filter Mask  .... Use as directed  Other Orders: Est. Patient Level III (66440)   Patient Instructions: 1)  We will have you sign  a record release for the  [facility] in [facility].   2)  Refills on symbicort sent to the pharmacy 3)  See prescription for Hepa filter mask 4)  Return 4 months Prescriptions: HEPA FILTER MASK Use as directed  #1 x 0   Entered and Authorized by:   Storm Frisk MD   Signed by:   Storm Frisk MD on 11/26/2009   Method used:   Print then Give to Patient   RxID:   [id number] SYMBICORT 160-4.5 MCG/ACT AERO (BUDESONIDE-FORMOTEROL FUMARATE) Inhale 2 puffs two  times a day  #1 x 6   Entered and Authorized by:   Storm Frisk MD   Signed by:   Storm Frisk MD on 11/26/2009   Method used:   Electronically to        [facility]. [phone]* (retail)       [address]       [place], [place]  [address]       Ph: [phone]       Fax: [phone]   RxID:   [id number]   Appended Document: Pulmonary OV reviewed

## 2010-07-01 NOTE — Assessment & Plan Note (Signed)
Summary: 2 month rov   Visit Type:  2 mo f/u Primary Provider:  Judithann Sheen MD  CC:  pt c/o bruising..pt on 325mg  ASA...no other complaints today.  History of Present Illness: Reviewed details of ASA in detail.  No chest pain.  Overall doing well.  Denies other problems.  Wants to have some alcohol.    Current Medications (verified): 1)  Boniva 150 Mg  Tabs (Ibandronate Sodium) .... Take 1 Tab By Mouth Once A Month 2)  Phenobarbital 60 Mg  Tabs (Phenobarbital) .... Take 1 Tablet Twice A Day 3)  Crestor 20 Mg Tabs (Rosuvastatin Calcium) .... Take 1 Tab By Mouth At Bedtime 4)  Prevacid 15 Mg Cpdr (Lansoprazole) .Marland Kitchen.. 1 Capsule By Mouth  Every Morning Before Meal 5)  Multivitamins  Tabs (Multiple Vitamin) .... Take 1 Tablet By Mouth Once A Day 6)  Calcium Carbonate-Vitamin D 600-400 Mg-Unit  Tabs (Calcium Carbonate-Vitamin D) .... Take 1 Tablet By Mouth Three Times A Day 7)  Triamterene-Hctz 75-50 Mg Tabs (Triamterene-Hctz) .... Take 1/2  Tablet By Mouth Once A Day 8)  Tylenol Extra Strength 500 Mg Tabs (Acetaminophen) .... Per Bottle 9)  Nitroglycerin 0.4 Mg Subl (Nitroglycerin) .... One Tablet Under Tongue Every 5 Minutes As Needed For Chest Pain---May Repeat Times Three 10)  Metoprolol Succinate 50 Mg Xr24h-Tab (Metoprolol Succinate) .... Take 1 Tablet  Once Daily 11)  Imodium A-D 2 Mg Tabs (Loperamide Hcl) .... 2 Stat Then 1 After Each Loose Stool As Needed 12)  Aspirin 325 Mg Tabs (Aspirin) .... Take 1 Tablet By Mouth Once A Day 13)  Potassium Chloride Crys Cr 20 Meq Cr-Tabs (Potassium Chloride Crys Cr) .... 1/2 Tab Once Daily 14)  Symbicort 160-4.5 Mcg/act Aero (Budesonide-Formoterol Fumarate) .... Inhale 2 Puffs Two Times A Day 15)  Dilt-Cd 120 Mg Xr24h-Cap (Diltiazem Hcl Coated Beads) .... Take 1 Capsule By Mouth Once A Day 16)  Flonase 50 Mcg/act Susp (Fluticasone Propionate) .... 2 Sprays Each Nostril Once Daily 17)  Saline Nasal Spray 0.65 % Soln (Saline) .... 3  Sprays Each Nostril Three Times A Day 18)  Proair Hfa 108 (90 Base) Mcg/act Aers (Albuterol Sulfate) .... 2 Puffs Every Four Hours As Needed 19)  Diclofenac Sodium 75 Mg Tbec (Diclofenac Sodium) .Marland Kitchen.. 1 Two Times A Day Pc For Arthritis 20)  Flexeril 10 Mg Tabs (Cyclobenzaprine Hcl) .Marland Kitchen.. 1 Morn,  Midafternoon and Hs For Muscle Spasm  Allergies: 1)  ! Neosporin Original (Neomycin-Bacitracin-Polymyxin) 2)  ! Codeine 3)  ! Pcn 4)  ! Adhesive Tape  Vital Signs:  Patient profile:   70 year old female Menstrual status:  hysterectomy Height:      64 inches Weight:      175 pounds BMI:     30.15 Pulse rate:   78 / minute Pulse rhythm:   regular BP sitting:   120 / 70  (left arm) Cuff size:   large  Vitals Entered By: Danielle Rankin, CMA (November 19, 2009 11:58 AM)  Physical Exam  General:  Well developed, well nourished, in no acute distress. Head:  normocephalic and atraumatic Eyes:  PERRLA/EOM intact; conjunctiva and lids normal. Neck:  Neck supple, no JVD. No masses, thyromegaly or abnormal cervical nodes. Lungs:  Clear bilaterally to auscultation and percussion. Heart:  Non-displaced PMI, chest non-tender; regular rate and rhythm, S1, S2 without murmurs, rubs or gallops. Carotid upstroke normal, no bruit. Pulses:  pulses normal in all 4 extremities Extremities:  minimal bruising. Neurologic:  Alert and oriented x 3.   Impression & Recommendations:  Problem # 1:  ATRIAL FIBRILLATION (ICD-427.31) remains on ASA.  No current symptoms.   Continue current treatment.  Her updated medication list for this problem includes:    Metoprolol Succinate 50 Mg Xr24h-tab (Metoprolol succinate) .Marland Kitchen... Take 1 tablet  once daily    Aspirin 325 Mg Tabs (Aspirin) .Marland Kitchen... Take 1 tablet by mouth once a day  Problem # 2:  EXTRINSIC ASTHMA, UNSPECIFIED (ICD-493.00) seeing Dr. Delford Field.  Also to address CT scan results----MAC complex Her updated medication list for this problem includes:    Symbicort 160-4.5  Mcg/act Aero (Budesonide-formoterol fumarate) ..... Inhale 2 puffs two times a day    Proair Hfa 108 (90 Base) Mcg/act Aers (Albuterol sulfate) .Marland Kitchen... 2 puffs every four hours as needed  Problem # 3:  THYROID NODULE (ICD-241.0) evaluated.    Problem # 4:  CAD (ICD-414.00) no recurrent symptoms.  Her updated medication list for this problem includes:    Nitroglycerin 0.4 Mg Subl (Nitroglycerin) ..... One tablet under tongue every 5 minutes as needed for chest pain---may repeat times three    Metoprolol Succinate 50 Mg Xr24h-tab (Metoprolol succinate) .Marland Kitchen... Take 1 tablet  once daily    Aspirin 325 Mg Tabs (Aspirin) .Marland Kitchen... Take 1 tablet by mouth once a day    Dilt-cd 120 Mg Xr24h-cap (Diltiazem hcl coated beads) .Marland Kitchen... Take 1 capsule by mouth once a day  Problem # 5:  HYPERLIPIDEMIA (ICD-272.4) currently at [facility].  Her updated medication list for this problem includes:    Crestor 20 Mg Tabs (Rosuvastatin calcium) .Marland Kitchen... Take 1 tab by mouth at bedtime  Patient Instructions: 1)  Your physician wants you to follow-up in:  4 MONTHS. You will receive a reminder letter in the mail two months in advance. If you don't receive a letter, please call our office to schedule the follow-up appointment. 2)  Your physician recommends that you continue on your current medications as directed. Please refer to the Current Medication list given to you today.  Appended Document: 2 month rov reviewed

## 2010-07-01 NOTE — Progress Notes (Signed)
  Phone Note Other Incoming   Request: Send information Summary of Call: Records received from [facility]. 7 pages forwarded to [facility] for Dr. Delford Field to review.

## 2010-07-01 NOTE — Progress Notes (Signed)
Summary: Pt has question re: beta blockers meds and bursitis  Phone Note Call from Patient Call back at [facility] Phone [phone]   Caller: Patient Summary of Call: Pt called and said that she has questions re: beta blocks and bursitis. Please call asap.  Initial call taken by: Lucy Antigua,  August 28, 2009 1:37 PM  Follow-up for Phone Call        wants to know if can take diltazem with diclofenac and  can she take 2 prevacid once daily  Follow-up by: Pura Spice, RN,  August 28, 2009 3:25 PM  Additional Follow-up for Phone Call Additional follow up Details #1::        per dr Scotty Court ok to take ditltazem and diclofenac together and ok to take 2 prevacid once daily pt aware.  Additional Follow-up by: Pura Spice, RN,  August 28, 2009 3:26 PM

## 2010-07-01 NOTE — Assessment & Plan Note (Signed)
Summary: Pulmonary OV   Primary Provider/Referring Provider:  Judithann Sheen MD  CC:  4 month follow up.  Was discharged from [facility] yesterday.  Breathing doing well overall.  Occ nonprod cough.  Denies SOB, wheezing, and chest tightness.  Marland Kitchen  History of Present Illness: 70 yo female with known hx of  asthmatic bronchitis, abn CXR with LLL scar,  ?  MAC ? TB in past.    November 26, 2009 3:11 PM Since 3/11: the pt notes  dyspnea and cough are better.  Not clearing throat as much.  The pt saw [facility] one week ago.     There is a ? of TB dx  years ago in early 2000.  The pt was living in [place] and was  rx with abx for 6months.  This was  Supervised intermittent.rx. This sounded like rx for active TB.  No records here. The pt was in [place], [employer].    Pt denies any significant sore throat, nasal congestion or excess secretions, fever, chills, sweats, unintended weight loss, pleurtic or exertional chest pain, orthopnea PND, or leg swelling Pt denies any increase in rescue therapy over baseline, denies waking up needing it or having any early am or nocturnal exacerbations of coughing/wheezing/or dyspnea.  March 25, 2010 4:24 PM The pt was  in hosp for PAF.  10/22-1023.  The pt spontaneously converted to NSR.  Now there is no chest pain or mucus.  The dyspnea is better.  The pt previously in 3/11 had PNA and AB flare.  The pt is not clearing throat now.  Records from [place] received. 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    Preventive Screening-Counseling & Management  Alcohol-Tobacco     Smoking Status: never  Current Medications (verified): 1)  Boniva 150 Mg  Tabs (Ibandronate Sodium) .... Take 1 Tab By Mouth Once A Month 2)  Phenobarbital 60 Mg  Tabs (Phenobarbital) .... Take 1 Tablet Twice A Day 3)  Crestor 20 Mg Tabs (Rosuvastatin Calcium) .... Take 1 Tab By Mouth At Bedtime 4)  Prevacid 15 Mg  Cpdr (Lansoprazole) .Marland Kitchen.. 1 Capsule By Mouth  Every Morning Before Meal 5)  Multivitamins  Tabs (Multiple Vitamin) .... Take 1 Tablet By Mouth Once A Day 6)  Calcium Carbonate-Vitamin D 600-400 Mg-Unit  Tabs (Calcium Carbonate-Vitamin D) .... Take 1 Tablet By Mouth Three Times A Day 7)  Triamterene-Hctz 37.5-25 Mg Tabs (Triamterene-Hctz) .... 1/2 Tablet Once Daily 8)  Tylenol Extra Strength 500 Mg Tabs (Acetaminophen) .... Per Bottle 9)  Nitroglycerin 0.4 Mg Subl (Nitroglycerin) .... One Tablet Under Tongue Every 5 Minutes As Needed For Chest Pain---May Repeat Times Three 10)  Metoprolol Succinate 50 Mg Xr24h-Tab (Metoprolol Succinate) .... Take 1 Tablet  Once Daily 11)  Imodium A-D 2 Mg Tabs (Loperamide Hcl) .... 2 Stat Then 1 After Each Loose Stool As Needed 12)  Aspirin 325 Mg Tabs (Aspirin) .... Take 1 Tablet By Mouth Once A Day 13)  Potassium Chloride Crys Cr 20 Meq Cr-Tabs (Potassium Chloride Crys Cr) .Marland Kitchen.. 1 Tab Once Daily 14)  Symbicort 160-4.5 Mcg/act Aero (Budesonide-Formoterol Fumarate) .... Inhale 2 Puffs Two Times A Day 15)  Dilt-Cd 180 Mg Xr24h-Cap (Diltiazem Hcl Coated Beads) .... Take One Capsule Daily 16)  Flonase 50 Mcg/act Susp (Fluticasone Propionate) .... 2 Sprays Each Nostril Once Daily 17)  Saline Nasal Spray 0.65 %  Soln (Saline) .... 3 Sprays Each Nostril Three Times A Day 18)  Diclofenac Sodium 75 Mg Tbec (Diclofenac Sodium) .Marland Kitchen.. 1 Two Times A Day Pc For Arthritis 19)  Flexeril 10 Mg Tabs (Cyclobenzaprine Hcl) .Marland Kitchen.. 1 Morn,  Midafternoon and Hs For Muscle Spasm 20)  Vitamin C .... Once Daily 21)  Hepa Filter Mask .... Use As Directed 22)  Proair Hfa 108 (90 Base) Mcg/act Aers (Albuterol Sulfate) .... 2 Puffs Every 4 Hour As Needed  Allergies (verified): 1)  ! Neosporin Original (Neomycin-Bacitracin-Polymyxin) 2)  ! Codeine 3)  ! Pcn 4)  ! Adhesive Tape  Past History:  Past medical, surgical, family and social histories (including risk factors) reviewed, and no  changes noted (except as noted below).  Past Medical History: Current Problems:  CAD (ICD-414.00) CHEST PAIN (ICD-786.50) HYPERLIPIDEMIA (ICD-272.4) CERUMEN IMPACTION, RIGHT (ICD-380.4) CHOLELITHIASIS (ICD-574.20) IRRITABLE BOWEL SYNDROME (ICD-564.1) ABDOMINAL PAIN (ICD-789.00) GERD (ICD-530.81) TINNITUS, RIGHT (ICD-388.30) SCREENING FOR MALIGNANT NEOPLASM OF THE CERVIX (ICD-V76.2) SEIZURE DISORDER (ICD-780.39) HYPOTHYROIDISM (ICD-244.9) ANEMIA (ICD-285.9) UNS ADVRS EFF OTH RX MEDICINAL&BIOLOGICAL SBSTNC (ZOX-096.04) UTI (ICD-599.0) ALLERGIC RHINITIS (ICD-477.9) EDEMA- LOCALIZED (ICD-782.3) MUSCLE SPASM (ICD-728.85) DEPRESSION, ACUTE (ICD-296.23) ARTHRITIS, BACK (ICD-721.90) EPILEPSY, 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 if cultured or empiric      -[place], [place]. Atrial Fibrillation    -Nonsustained.  Recurrent 10/22- 03/23/10  Past Surgical History: Reviewed history from 01/21/2009 and no changes required. Hysterectomy  1985 L wrist fx with plates  5409  Family History: Reviewed history from 04/10/2009 and no changes required. M had angina F had MI, CHF  Social History: Reviewed history from 07/31/2009 and no changes required. divorced.   No children. never smoked social alcohol still working: Producer, television/film/video at [employer]  Review of Systems  The patient denies shortness of breath with activity, shortness of breath at rest, productive cough, non-productive cough, coughing up blood, chest pain, irregular heartbeats, acid heartburn, indigestion, loss of appetite, weight change, abdominal pain, difficulty swallowing, sore throat, tooth/dental problems, headaches, nasal congestion/difficulty breathing through nose, sneezing, itching, ear ache, anxiety, depression, hand/feet swelling, joint stiffness or pain, rash, change in color of mucus, and fever.    Vital Signs:  Patient profile:   70 year  old female Menstrual status:  hysterectomy Height:      64 inches Weight:      182.50 pounds BMI:     31.44 O2 Sat:      97 % on Room air Temp:     98.3 degrees F oral Pulse rate:   89 / minute BP sitting:   120 / 74  (left arm) Cuff size:   regular  Vitals Entered By: Gweneth Dimitri RN (March 25, 2010 4:09 PM)  O2 Flow:  Room air  Clinical Reports Reviewed:  PFT's:  08/22/2009: FEV1 %Predicted:  69.20 FEV1/FVC %Predicted:  106.80 FVC %Predicted:  64.40  CC: 4 month follow up.  Was discharged from [facility].  Breathing doing well overall.  Occ nonprod cough.  Denies SOB, wheezing, chest tightness.   Pain Assessment Patient in pain? yes      Comments Medications reviewed with patient Daytime contact number verified with patient. Gweneth Dimitri RN  March 25, 2010 4:12 PM    Physical Exam  Additional Exam:  GEN: A/Ox3; pleasant , NAD HEENT:  /AT, , EACs-clear, TMs-wnl, NOSE-clear, THROAT-clear NECK:  Supple w/ fair ROM; no JVD; normal carotid impulses w/o bruits; no thyromegaly or nodules palpated; no lymphadenopathy. RESP  distant BS, no wheeze CARD:  RRR, no m/r/g   GI:   Soft & nt; nml bowel sounds; no organomegaly or masses detected. Musco: Warm bil,  no calf tenderness edema, clubbing, pulses intact Neuro: Intact w/ no focal deficits noted.    Impression & Recommendations:  Problem # 1:  EXTRINSIC ASTHMA, UNSPECIFIED (ICD-493.00) Assessment Improved Stable asthma no active airway inflammation at this time, sinusitis drove last exacerbation earlier this year, all now better.  Now with PAF hx I am concerned that long acting beta agonist in the symbicort may be driving the arrhythmia  plan d/c symbicort change to ICS qvar, no LABA in qvar. rov 2months  Medications Added to Medication List This Visit: 1)  Triamterene-hctz 37.5-25 Mg Tabs (Triamterene-hctz) .... 1/2 tablet once daily 2)  Qvar 40 Mcg/act Aers (Beclomethasone dipropionate) .... Two  puffs twice daily 3)  Proair Hfa 108 (90 Base) Mcg/act Aers (Albuterol sulfate) .... 2 puffs every 4 hour as needed  Complete Medication List: 1)  Boniva 150 Mg Tabs (Ibandronate sodium) .... Take 1 tab by mouth once a month 2)  Phenobarbital 60 Mg Tabs (Phenobarbital) .... Take 1 tablet twice a day 3)  Crestor 20 Mg Tabs (Rosuvastatin calcium) .... Take 1 tab by mouth at bedtime 4)  Prevacid 15 Mg Cpdr (Lansoprazole) .Marland Kitchen.. 1 capsule by mouth  every morning before meal 5)  Multivitamins Tabs (Multiple vitamin) .... Take 1 tablet by mouth once a day 6)  Calcium Carbonate-vitamin D 600-400 Mg-unit Tabs (Calcium carbonate-vitamin d) .... Take 1 tablet by mouth three times a day 7)  Triamterene-hctz 37.5-25 Mg Tabs (Triamterene-hctz) .... 1/2 tablet once daily 8)  Tylenol Extra Strength 500 Mg Tabs (Acetaminophen) .... Per bottle 9)  Nitroglycerin 0.4 Mg Subl (Nitroglycerin) .... One tablet under tongue every 5 minutes as needed for chest pain---may repeat times three 10)  Metoprolol Succinate 50 Mg Xr24h-tab (Metoprolol succinate) .... Take 1 tablet  once daily 11)  Imodium A-d 2 Mg Tabs (Loperamide hcl) .... 2 stat then 1 after each loose stool as needed 12)  Aspirin 325 Mg Tabs (Aspirin) .... Take 1 tablet by mouth once a day 13)  Potassium Chloride Crys Cr 20 Meq Cr-tabs (Potassium chloride crys cr) .Marland Kitchen.. 1 tab once daily 14)  Qvar 40 Mcg/act Aers (Beclomethasone dipropionate) .... Two puffs twice daily 15)  Dilt-cd 180 Mg Xr24h-cap (Diltiazem hcl coated beads) .... Take one capsule daily 16)  Flonase 50 Mcg/act Susp (Fluticasone propionate) .... 2 sprays each nostril once daily 17)  Saline Nasal Spray 0.65 % Soln (Saline) .... 3 sprays each nostril three times a day 18)  Diclofenac Sodium 75 Mg Tbec (Diclofenac sodium) .Marland Kitchen.. 1 two times a day pc for arthritis 19)  Flexeril 10 Mg Tabs (Cyclobenzaprine hcl) .Marland Kitchen.. 1 morn,  midafternoon and hs for muscle spasm 20)  Vitamin C  .... Once  daily 21)  Hepa Filter Mask  .... Use as directed 22)  Proair Hfa 108 (90 Base) Mcg/act Aers (Albuterol sulfate) .... 2 puffs every 4 hour as needed  Other Orders: Est. Patient Level IV (16109)  Patient Instructions: 1)  Stop Symbicort 2)  Start Qvar two puff twice daily 3)  Return 2 months Prescriptions: QVAR 40 MCG/ACT  AERS (BECLOMETHASONE DIPROPIONATE) Two puffs twice daily  #1 x 6   Entered and Authorized by:   Storm Frisk MD   Signed by:   Storm Frisk MD on 03/25/2010   Method used:   Print  then Give to Patient   RxID:   [id number]    Immunization History:  Influenza Immunization History:    Influenza:  historical (03/23/2010)  Pneumovax Immunization History:    Pneumovax:  historical (03/23/2010)

## 2010-07-01 NOTE — Progress Notes (Signed)
Summary: pt has questions about meds  Phone Note Call from Patient Call back at Home Phone [phone]   Caller: Patient Reason for Call: Talk to Nurse, Talk to Doctor Summary of Call: pt has question about meds Initial call taken by: Omer Jack,  August 02, 2009 12:15 PM  Follow-up for Phone Call        spoke w/pt, she needed to know about cont. levaqin or not, no indication for pt to cont. she also ask about the cost of xopenex, advised her to f/u w/pulm Meredith Staggers, RN  August 02, 2009 12:43 PM

## 2010-07-01 NOTE — Assessment & Plan Note (Signed)
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 We plan to get lab studies when she returns in 2 months No other complaints, in general doing fine Has appt with Dr. [name] in 1 week Going to [facility] in 2-3 weeks  Allergies: 1)  ! Neosporin Original (Neomycin-Bacitracin-Polymyxin) 2)  ! Codeine 3)  ! Pcn 4)  ! Adhesive Tape  Past History:  Past Medical History: Last updated: 01/21/2009 Current Problems:  CAD (ICD-414.00) CHEST PAIN (ICD-786.50) HYPERLIPIDEMIA (ICD-272.4) CERUMEN IMPACTION, RIGHT (ICD-380.4) CHOLELITHIASIS (ICD-574.20) IRRITABLE BOWEL SYNDROME (ICD-564.1) ABDOMINAL PAIN (ICD-789.00) GERD (ICD-530.81) TINNITUS, RIGHT (ICD-388.30) SCREENING FOR MALIGNANT NEOPLASM OF THE CERVIX (ICD-V76.2) SEIZURE DISORDER (ICD-780.39) HYPOTHYROIDISM (ICD-244.9) ANEMIA (ICD-285.9) UNS ADVRS EFF OTH RX MEDICINAL&BIOLOGICAL SBSTNC (ZOX-096.04) UTI (ICD-599.0) ALLERGIC RHINITIS (ICD-477.9) EDEMA- LOCALIZED (ICD-782.3) MUSCLE SPASM (ICD-728.85) DEPRESSION, ACUTE (ICD-296.23) ARTHRITIS, BACK (ICD-721.90) EPILEPSY, 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   No children. never smoked social alcohol still working: Producer, television/film/video at [employer]  Risk Factors: Smoking Status: never (08/22/2009)  Review of Systems      See HPI  The patient denies anorexia, fever, weight loss, weight gain, vision loss, decreased hearing, hoarseness, chest pain, syncope, dyspnea on exertion, peripheral edema, prolonged cough, headaches, hemoptysis, abdominal pain, melena, hematochezia, severe indigestion/heartburn, hematuria, incontinence, genital sores, muscle weakness, suspicious skin lesions, transient blindness, difficulty walking, depression, unusual weight change, abnormal bleeding, enlarged lymph nodes, angioedema, breast masses, and testicular masses.    Physical Exam  General:  Well-developed,well-nourished,in no acute distress; alert,appropriate and cooperative throughout examination Neck:  tenderness over cerical spiner Lungs:  Normal respiratory effort, chest expands symmetrically. Lungs are clear to auscultation, no crackles or wheezes. Heart:  Normal rate and regular rhythm. S1 and S2 normal without gallop, murmur, click, rub or other extra sounds. Abdomen:  Bowel sounds positive,abdomen soft and non-tender without masses, organomegaly or hernias noted. Msk:  Tender C 3-7 on rt siide of cervical spine with radiation pain into Trapezius and into shouder muscle spasm on rt side Pulses:  R and L carotid,radial,femoral,dorsalis pedis and posterior tibial pulses are full and equal bilaterally Psych:  Oriented X3.     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 Take 1 tablet twice a  day  Complete Medication List:  1)  Boniva 150 Mg Tabs (Ibandronate sodium) .... Take 1 tab by mouth once a month 2)  Phenobarbital 60 Mg Tabs (Phenobarbital) .... Take 1 tablet twice a day 3)  Crestor 20 Mg Tabs (Rosuvastatin calcium) .... Take 1 tab by mouth at bedtime 4)  Prevacid 15 Mg Cpdr (Lansoprazole) .Marland Kitchen.. 1 capsule by mouth  every morning before meal 5)  Multivitamins Tabs (Multiple vitamin) .... Take 1 tablet by mouth once a day 6)  Calcium Carbonate-vitamin D 600-400 Mg-unit Tabs (Calcium carbonate-vitamin d) .... Take 1 tablet by mouth three times a day 7)  Triamterene-hctz 75-50 Mg Tabs (Triamterene-hctz) .... Take 1/2  tablet by mouth once a day 8)  Tylenol Extra Strength 500 Mg Tabs (Acetaminophen) .... Per bottle 9)  Nitroglycerin 0.4 Mg Subl (Nitroglycerin) .... One tablet under tongue every 5 minutes as needed for chest pain---may repeat times three 10)  Metoprolol Succinate 50 Mg Xr24h-tab (Metoprolol succinate) .... Take 1 tablet  once daily 11)  Imodium A-d 2 Mg Tabs (Loperamide hcl) .... 2 stat then 1 after each loose stool as needed 12)  Aspirin 325 Mg Tabs (Aspirin) .... Take 1 tablet by mouth once a day 13)  Potassium Chloride Crys Cr 20 Meq Cr-tabs (Potassium chloride crys cr) .... Take one tablet by mouth daily 14)  Symbicort 160-4.5 Mcg/act Aero (Budesonide-formoterol fumarate) .... Inhale 2 puffs two times a day 15)  Dilt-cd 120 Mg Xr24h-cap (Diltiazem hcl coated beads) .... Take 1 capsule by mouth once a day 16)  Flonase 50 Mcg/act Susp (Fluticasone propionate) .... 2 sprays each nostril once daily 17)  Saline Nasal Spray 0.65 % Soln (Saline) .... 3 sprays each nostril three times a day 18)  Proair Hfa 108 (90 Base) Mcg/act Aers (Albuterol sulfate) .... 2 puffs every four hours as needed 19)  Diclofenac Sodium 75 Mg Tbec (Diclofenac sodium) .Marland Kitchen.. 1 two times a day pc for arthritis 20)  Flexeril 10 Mg Tabs (Cyclobenzaprine hcl) .Marland Kitchen.. 1 morn,  midafternoon and hs  for muscle spasm  Other Orders: Depo- Medrol 80mg  (J1040) Depo- Medrol 40mg  (J1030) Admin of Therapeutic Inj  intramuscular or subcutaneous (84132)  Patient Instructions: 1)  cervical arthritis with referred pain and causing muscle spasm 2)  pain down arm is from neck 3)  diclofenac 75 mg twice daily 4)  120 mg Depomedrol IM for inflammation 5)  flexeril 1 three times day for spasm 6)  refilled phenobarbital 7)  Use ice paack 15 minutes then apply heat to muscles for 20 minutes 8)  return 2 months if have not completely gotten better Prescriptions: FLEXERIL 10 MG TABS (CYCLOBENZAPRINE HCL) 1 morn,  midafternoon and hs for muscle spasm  #90 x 11   Entered and Authorized by:   Judithann Sheen MD   Signed by:   Judithann Sheen MD on 11/12/2009   Method used:   Electronically to        [facility]. [phone]* (retail)       [address]       [place], [place]  [address]       Ph: [phone]       Fax: [phone]   RxID:   [id number] DICLOFENAC SODIUM 75 MG TBEC (DICLOFENAC SODIUM) 1 two times a day pc for arthritis  #60 x 11   Entered and Authorized by:   Judithann Sheen MD   Signed by:  Judithann Sheen MD on 11/12/2009   Method used:   Electronically to        [facility]. [phone]* (retail)       [address]       [place], [place]  [address]       Ph: [phone]       Fax: [phone]   RxID:   [id number] PHENOBARBITAL 60 MG  TABS (PHENOBARBITAL) take 1 tablet twice a day  #60 x 11   Entered and Authorized by:   Judithann Sheen MD   Signed by:   Judithann Sheen MD on 11/12/2009   Method used:   Print then Give to Patient   RxID:   [id number]    Medication Administration  Injection # 1:    Medication: Depo- Medrol 80mg     Diagnosis: ARTHRITIS, BACK (ICD-721.90)    Route: IM    Site: RUOQ gluteus    Exp Date: 04/2012    Lot #: [id number]    Mfr:  [employer]    Patient tolerated injection without complications    Given by: Pura Spice, RN (November 12, 2009 1:00 PM)  Injection # 2:    Medication: Depo- Medrol 40mg     Diagnosis: ARTHRITIS, BACK (ICD-721.90)    Route: IM    Site: RUOQ gluteus    Exp Date: 04/2012    Lot #: [id number]    Mfr: [employer]    Patient tolerated injection without complications    Given by: Pura Spice, RN (November 12, 2009 1:01 PM)  Orders Added: 1)  Depo- Medrol 80mg  [J1040] 2)  Depo- Medrol 40mg  [J1030] 3)  Admin of Therapeutic Inj  intramuscular or subcutaneous [96372] 4)  Est. Patient Level IV [75643]

## 2010-07-01 NOTE — Assessment & Plan Note (Signed)
Summary: NP follow up - post hosp   CC:  post hosp follow up - states breathing is "okay" and no complaints today.Marland Kitchen  History of Present Illness: 70 yo female with known hx of   July 31, 2009-Presents for post hosp follow up. Admitted 2/18-2/24/11 for Afib w/ RVR.  She was tx w/ IV diltiazem w/ conversion to  sinus rhythm.  Transitioned to oral   beta-blocker and  diltiazem therapy. She also tx for acute bronchitis w/ IV abx.  She was seen by pulmonary for consult. She was started on aggressive pulmoanary hygiene regimen.   Pancultures have been negative.  CT of the neck and sinuses was performed showing mild sinusitis.  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  She also had incidental finding of the right upper lobe nodule   Since discharge she is feeling much better. Started on symbicort at discharge. Denies chest pain, dyspnea, orthopnea, hemoptysis, fever, n/v/d, edema, headache,       CT Scan  Procedure date:  07/18/2009  Findings:      CT of the neck and sinuses showing mild sinusitis.  There were     multiple small lymph nodes throughout the neck and superior     mediastinum which were not pathologically enlarged.  A 7 mm right     upper lobe lung nodule which was not visible on CT scan in 2002     with recommendation for follow-up.  Enlarged heterogeneous thyroid,     which could represent multinodular goiter.   Preventive Screening-Counseling & Management  Alcohol-Tobacco     Smoking Status: never  Medications Prior to Update: 1)  Boniva 150 Mg  Tabs (Ibandronate Sodium) .... Take 1 Monthly 2)  Phenobarbital 60 Mg  Tabs (Phenobarbital) .... Take 1 Tablet Twice A Day 3)  Crestor 20 Mg Tabs (Rosuvastatin Calcium) .... Take One Tablet By Mouth Daily. 4)  Prevacid 15 Mg Cpdr (Lansoprazole) .Marland Kitchen.. 1-2 Qd 5)  Align  Caps (Misc Intestinal Flora Regulat) .Marland Kitchen.. 1 Qd 6)  Multivitamins  Tabs  (Multiple Vitamin) .... Take 1 Tablet By Mouth Once A Day 7)  Calcium Carbonate-Vitamin D 600-400 Mg-Unit  Tabs (Calcium Carbonate-Vitamin D) .... Take 1 Tablet By Mouth Three Times A Day 8)  Triamterene-Hctz 75-50 Mg Tabs (Triamterene-Hctz) .... Take 1 Tablet By Mouth Once A Day 9)  Ibuprofen 200 Mg Tabs (Ibuprofen) .... As Needed 10)  Nitroglycerin 0.4 Mg Subl (Nitroglycerin) .... One Tablet Under Tongue Every 5 Minutes As Needed For Chest Pain---May Repeat Times Three 11)  Metoprolol Succinate 25 Mg Xr24h-Tab (Metoprolol Succinate) .... Take One Tablet By Mouth Daily 12)  Imodium A-D 2 Mg Tabs (Loperamide Hcl) .... 2 Stat Then 1 After Each Loose Stool 13)  Aspir-Low 81 Mg Tbec (Aspirin) .Marland Kitchen.. 1 Each Day 14)  Potassium Chloride Crys Cr 20 Meq Cr-Tabs (Potassium Chloride Crys Cr) .... Take One Tablet By Mouth Daily 15)  Zithromax Z-Pak 250 Mg Tabs (Azithromycin) .... 2 Stat Then 1 Per Day 16)  Mucinex D [phone] Mg Xr12h-Tab (Pseudoephedrine-Guaifenesin) .Marland Kitchen.. 1 Bid 17)  Diclofenac Sodium 75 Mg Tbec (Diclofenac Sodium) .Marland Kitchen.. 1 Two Times A Day Pc For Bursitis  Current Medications (verified): 1)  Boniva 150 Mg  Tabs (Ibandronate Sodium) .... Take 1 Tab By Mouth Once A Month 2)  Phenobarbital 60 Mg  Tabs (Phenobarbital) .... Take 1 Tablet Twice A Day 3)  Crestor 20 Mg Tabs (Rosuvastatin  Calcium) .... Take 1 Tab By Mouth At Bedtime 4)  Prevacid 15 Mg Cpdr (Lansoprazole) .Marland Kitchen.. 1 Capsule By Mouth  Every Morning Before Meal 5)  Multivitamins  Tabs (Multiple Vitamin) .... Take 1 Tablet By Mouth Once A Day 6)  Calcium Carbonate-Vitamin D 600-400 Mg-Unit  Tabs (Calcium Carbonate-Vitamin D) .... Take 1 Tablet By Mouth Three Times A Day 7)  Triamterene-Hctz 75-50 Mg Tabs (Triamterene-Hctz) .... Take 1/2  Tablet By Mouth Once A Day 8)  Tylenol Extra Strength 500 Mg Tabs (Acetaminophen) .... Per Bottle 9)  Nitroglycerin 0.4 Mg Subl (Nitroglycerin) .... One Tablet Under Tongue Every 5 Minutes As Needed For  Chest Pain---May Repeat Times Three 10)  Metoprolol Succinate 50 Mg Xr24h-Tab (Metoprolol Succinate) .... Take 1 Tablet By Mouth Once A Day 11)  Imodium A-D 2 Mg Tabs (Loperamide Hcl) .... 2 Stat Then 1 After Each Loose Stool 12)  Aspirin 325 Mg Tabs (Aspirin) .... Take 1 Tablet By Mouth Once A Day 13)  Potassium Chloride Crys Cr 20 Meq Cr-Tabs (Potassium Chloride Crys Cr) .... Take One Tablet By Mouth Daily 14)  Diclofenac Sodium 75 Mg Tbec (Diclofenac Sodium) .Marland Kitchen.. 1 Two Times A Day Pc For Bursitis 15)  Symbicort 160-4.5 Mcg/act Aero (Budesonide-Formoterol Fumarate) .... Inhale 2 Puffs Two Times A Day 16)  Dilt-Cd 120 Mg Xr24h-Cap (Diltiazem Hcl Coated Beads) .... Take 1 Capsule By Mouth Once A Day 17)  Flonase 50 Mcg/act Susp (Fluticasone Propionate) .... 2 Sprays Each Nostril Once Daily 18)  Levaquin 250 Mg Tabs (Levofloxacin) .... As Directed On Hosp Discharge Sheet 19)  Prednisone 10 Mg Tabs (Prednisone) .... Taper As Directed On Hosp Discharge Sheet 20)  Saline Nasal Spray 0.65 % Soln (Saline) .... 3 Sprays Each Nostril Three Times A Day 21)  Xopenex Hfa 45 Mcg/act Aero (Levalbuterol Tartrate) .... Inhale 2 Puffs Every Four Hours As Needed  Allergies (verified): 1)  ! Neosporin Original (Neomycin-Bacitracin-Polymyxin) 2)  ! Codeine 3)  ! Pcn 4)  ! Adhesive Tape  Past History:  Past Medical History: Last updated: 01/21/2009 Current Problems:  CAD (ICD-414.00) CHEST PAIN (ICD-786.50) HYPERLIPIDEMIA (ICD-272.4) CERUMEN IMPACTION, RIGHT (ICD-380.4) CHOLELITHIASIS (ICD-574.20) IRRITABLE BOWEL SYNDROME (ICD-564.1) ABDOMINAL PAIN (ICD-789.00) GERD (ICD-530.81) TINNITUS, RIGHT (ICD-388.30) SCREENING FOR MALIGNANT NEOPLASM OF THE CERVIX (ICD-V76.2) SEIZURE DISORDER (ICD-780.39) HYPOTHYROIDISM (ICD-244.9) ANEMIA (ICD-285.9) UNS ADVRS EFF OTH RX MEDICINAL&BIOLOGICAL SBSTNC (EAV-409.81) UTI (ICD-599.0) ALLERGIC RHINITIS (ICD-477.9) EDEMA- LOCALIZED (ICD-782.3) MUSCLE SPASM  (ICD-728.85) DEPRESSION, ACUTE (ICD-296.23) ARTHRITIS, BACK (ICD-721.90) EPILEPSY, 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   No children. never smoked social alcohol still working: Producer, television/film/video at [employer]  Risk Factors: Smoking Status: never (07/31/2009)  Social History: divorced.   No children. never smoked social alcohol still working: Producer, television/film/video at [employer] Smoking Status:  never  Review of Systems      See HPI  Vital Signs:  Patient profile:   70 year old female Menstrual status:  hysterectomy Height:      64 inches Weight:      173 pounds O2 Sat:      97 % on Room air Temp:     97.7 degrees F oral Pulse rate:   77 / minute BP sitting:   130 / 72  (left arm) Cuff size:  regular  Vitals Entered By: Boone Master CNA (July 31, 2009 11:24 AM)  O2 Flow:  Room air CC: post hosp follow up - states breathing is "okay", no complaints today. Is Patient Diabetic? No Comments Medications reviewed with patient Daytime contact number verified with patient. Boone Master CNA  July 31, 2009 11:38 AM    Physical Exam  Additional Exam:  GEN: A/Ox3; pleasant , NAD HEENT:  /AT, , EACs-clear, TMs-wnl, NOSE-clear, THROAT-clear NECK:  Supple w/ fair ROM; no JVD; normal carotid impulses w/o bruits; no thyromegaly or nodules palpated; no lymphadenopathy. RESP  Clear to P & A; w/o, wheezes/ rales/ or rhonchi. CARD:  RRR, no m/r/g   GI:   Soft & nt; nml bowel sounds; no organomegaly or masses detected. Musco: Warm bil,  no calf tenderness edema, clubbing, pulses intact Neuro: Intact w/ no focal deficits noted.    Impression & Recommendations:  Problem # 1:  ACUTE BRONCHITIS  (ICD-466.0)  Recent flare during hospitalization for AFIB w/ RVR Now improving REC:  Continue on Symbicort 2 puffs two times a day until seen back at next office visit.  Mucinex DM two times a day as needed cough/congeston  Increase fluids.  Finish Levaquin and prednsione taper as directed.  follow up 1 month w/ Dr. Delford Field as scheduled.   The following medications were removed from the medication list:    Zithromax Z-pak 250 Mg Tabs (Azithromycin) .Marland Kitchen... 2 stat then 1 per day    Mucinex D [phone] Mg Xr12h-tab (Pseudoephedrine-guaifenesin) .Marland Kitchen... 1 bid Her updated medication list for this problem includes:    Symbicort 160-4.5 Mcg/act Aero (Budesonide-formoterol fumarate) ..... Inhale 2 puffs two times a day    Levaquin 250 Mg Tabs (Levofloxacin) .Marland Kitchen... As directed on hosp discharge sheet    Xopenex Hfa 45 Mcg/act Aero (Levalbuterol tartrate) ..... Inhale 2 puffs every four hours as needed  Orders: Est. Patient Level III (04540)  Problem # 2:  LUNG NODULE (ICD-518.89)   A 7 mm right  upper lobe lung nodule which was not visible on CT scan in 2002 She is a never smoker.  Will need additional follow up CT , to be determined on follow up w/ Dr. Delford Field  follow up Dr. Delford Field in 1 month  Orders: Est. Patient Level III (98119)  Medications Added to Medication List This Visit: 1)  Boniva 150 Mg Tabs (Ibandronate sodium) .... Take 1 tab by mouth once a month 2)  Crestor 20 Mg Tabs (Rosuvastatin calcium) .... Take 1 tab by mouth at bedtime 3)  Prevacid 15 Mg Cpdr (Lansoprazole) .Marland Kitchen.. 1 capsule by mouth  every morning before meal 4)  Triamterene-hctz 75-50 Mg Tabs (Triamterene-hctz) .... Take 1/2  tablet by mouth once a day 5)  Tylenol Extra Strength 500 Mg Tabs (Acetaminophen) .... Per bottle 6)  Metoprolol Succinate 50 Mg Xr24h-tab (Metoprolol succinate) .... Take 1 tablet by mouth once a day 7)  Aspirin 325 Mg Tabs (Aspirin) .... Take 1 tablet by mouth once a day 8)  Symbicort  160-4.5 Mcg/act Aero (Budesonide-formoterol fumarate) .... Inhale 2 puffs two times a day 9)  Dilt-cd 120 Mg Xr24h-cap (Diltiazem hcl coated beads) .... Take 1 capsule by mouth once a day 10)  Flonase 50 Mcg/act Susp (Fluticasone propionate) .... 2 sprays each nostril once daily 11)  Levaquin 250 Mg Tabs (Levofloxacin) .... As directed on hosp discharge sheet 12)  Prednisone 10 Mg Tabs (Prednisone) .... Taper as directed on hosp discharge sheet 13)  Saline Nasal Spray 0.65 % Soln (Saline) .... 3 sprays each nostril three times a day 14)  Xopenex Hfa 45 Mcg/act Aero (Levalbuterol tartrate) .... Inhale 2 puffs every four hours as needed  Complete Medication List: 1)  Boniva 150 Mg Tabs (Ibandronate sodium) .... Take 1 tab by mouth once a month 2)  Phenobarbital 60 Mg Tabs (Phenobarbital) .... Take 1 tablet twice a day 3)  Crestor 20 Mg Tabs (Rosuvastatin calcium) .... Take 1 tab by mouth at bedtime 4)  Prevacid 15 Mg Cpdr (Lansoprazole) .Marland Kitchen.. 1 capsule by mouth  every morning before meal 5)  Multivitamins Tabs (Multiple vitamin) .... Take 1 tablet by mouth once a day 6)  Calcium Carbonate-vitamin D 600-400 Mg-unit Tabs (Calcium carbonate-vitamin d) .... Take 1 tablet by mouth three times a day 7)  Triamterene-hctz 75-50 Mg Tabs (Triamterene-hctz) .... Take 1/2  tablet by mouth once a day 8)  Tylenol Extra Strength 500 Mg Tabs (Acetaminophen) .... Per bottle 9)  Nitroglycerin 0.4 Mg Subl (Nitroglycerin) .... One tablet under tongue every 5 minutes as needed for chest pain---may repeat times three 10)  Metoprolol Succinate 50 Mg Xr24h-tab (Metoprolol succinate) .... Take 1 tablet by mouth once a day 11)  Imodium A-d 2 Mg Tabs (Loperamide hcl) .... 2 stat then 1 after each loose stool 12)  Aspirin 325 Mg Tabs (Aspirin) .... Take 1 tablet by mouth once a day 13)  Potassium Chloride Crys Cr 20 Meq Cr-tabs (Potassium chloride crys cr) .... Take one tablet by mouth daily 14)  Diclofenac Sodium 75  Mg Tbec (Diclofenac sodium) .Marland Kitchen.. 1 two times a day pc for bursitis 15)  Symbicort 160-4.5 Mcg/act Aero (Budesonide-formoterol fumarate) .... Inhale 2 puffs two times a day 16)  Dilt-cd 120 Mg Xr24h-cap (Diltiazem hcl coated beads) .... Take 1 capsule by mouth once a day 17)  Flonase 50 Mcg/act Susp (Fluticasone propionate) .... 2 sprays each nostril once daily 18)  Levaquin 250 Mg Tabs (Levofloxacin) .... As directed on hosp discharge sheet 19)  Prednisone 10 Mg Tabs (Prednisone) .... Taper as directed on hosp discharge sheet 20)  Saline Nasal Spray 0.65 % Soln (Saline) .... 3 sprays each nostril three times a day 21)  Xopenex Hfa 45 Mcg/act Aero (Levalbuterol tartrate) .... Inhale 2 puffs every four hours as needed  Patient Instructions: 1)  Continue on Symbicort 2 puffs two times a day until seen back at next office visit.  2)  Mucinex DM two times a day as needed cough/congeston  3)  Increase fluids.  4)  Finish Levaquin and prednsione taper as directed.  5)  follow up 1 month w/ Dr. Delford Field as scheduled.

## 2010-07-01 NOTE — Miscellaneous (Signed)
  Clinical Lists Changes  Observations: Added new observation of CT SCAN:   Findings: Thyroid nodules measure up to 1.8 cm on the left.  Mild   rightward tracheal deviation.  No pathologically enlarged   mediastinal, hilar or axillary lymph nodes.  Heart size normal.  No   pericardial effusion.    There are scattered areas of mild peribronchovascular nodularity   and bronchiectasis.  Bronchiectasis is worst in the right middle   lobe and lingula.  No pleural fluid.  Airway is otherwise   unremarkable.    Incidental imaging of the upper abdomen shows no acute findings.   No worrisome lytic or sclerotic lesions.  Degenerative changes are   seen in the spine.    IMPRESSION:    1.  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   2.  Thyroid nodules.  Please refer to thyroid ultrasound and biopsy   performed 07/23/2009 and 08/13/2009, respectively.  (08/19/2009 11:52)      CT Scan  Procedure date:  08/19/2009  Findings:        Findings: Thyroid nodules measure up to 1.8 cm on the left.  Mild   rightward tracheal deviation.  No pathologically enlarged   mediastinal, hilar or axillary lymph nodes.  Heart size normal.  No   pericardial effusion.    There are scattered areas of mild peribronchovascular nodularity   and bronchiectasis.  Bronchiectasis is worst in the right middle   lobe and lingula.  No pleural fluid.  Airway is otherwise   unremarkable.    Incidental imaging of the upper abdomen shows no acute findings.   No worrisome lytic or sclerotic lesions.  Degenerative changes are   seen in the spine.    IMPRESSION:    1.  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   2.  Thyroid nodules.  Please refer to thyroid ultrasound and biopsy   performed 07/23/2009 and  08/13/2009, respectively.

## 2010-07-01 NOTE — Progress Notes (Signed)
Summary: triage message  Phone Note Call from Patient   Caller: Patient Call For: Judithann Sheen MD Summary of Call: Message was left on my voice mail Friday while I was away.  LMTCB on pt's personal voice mail.  [phone] Initial call taken by: Lynann Beaver CMA,  July 22, 2009 8:48 AM

## 2010-07-01 NOTE — Letter (Signed)
Summary: Out of Work  [employer] [employer]  [address]   [place], [place] [address]   Phone: [phone]  Fax: [phone]    July 31, 2009   Employee:  Misty Meyer    To Whom It May Concern:   For Medical reasons, please excuse the above named employee from work for the following dates:  Start:   July 19, 2009  End:   August 07, 2009  If you need additional information, please feel free to contact our office.         Sincerely,       Tammy Parrett, N.P.

## 2010-07-01 NOTE — Progress Notes (Signed)
Summary: rx  Phone Note Call from Patient Call back at Home Phone [phone]   Caller: Patient Call For: Tammy P Reason for Call: Talk to Nurse Summary of Call: xopenex is $65.00  -  is there something cheaper that pt can get? Initial call taken by: Eugene Gavia,  August 02, 2009 1:16 PM  Follow-up for Phone Call        Pt staets xopenex is too exspensive, can we send in proair or ventolin? Please advise.Carron Curie CMA  August 02, 2009 3:39 PM   Additional Follow-up for Phone Call Additional follow up Details #1::        they had wanted xopnex d/t her recent AFIB w/ RVR   Additional Follow-up by: Rubye Oaks NP,  August 02, 2009 4:12 PM    Additional Follow-up for Phone Call Additional follow up Details #2::    Spoke with pt; aware to come by office on Monday to get savings card for 1 year. Reynaldo Minium CMA  August 02, 2009 4:36 PM Pt will ask  to speak with me.

## 2010-07-01 NOTE — Assessment & Plan Note (Signed)
Summary: Pulmonary OV   CC:  Follow up for abnormal CT chest.  denies SOB, wheezing, chest tightness, and and cough.  .  History of Present Illness: 70 yo female with known hx of   July 31, 2009-Presents for post hosp follow up. Admitted 2/18-2/24/11 for Afib w/ RVR.  She was tx w/ IV diltiazem w/ conversion to  sinus rhythm.  Transitioned to oral   beta-blocker and  diltiazem therapy. She also tx for acute bronchitis w/ IV abx.  She was seen by pulmonary for consult. She was started on aggressive pulmoanary hygiene regimen.   Pancultures have been negative.  CT of the neck and sinuses was performed showing mild sinusitis.  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  She also had incidental finding of the right upper lobe nodule   Since discharge she is feeling much better. Started on symbicort at discharge. Denies chest pain, dyspnea, orthopnea, hemoptysis, fever, n/v/d, edema, headache,     August 22, 2009 4:07 PM The pt is now off pred and levaquin The pt is still on symbicort The pt now notes she is  no longer coughing,  and she is  not tight in the chest.   Pt denies any significant sore throat, nasal congestion or excess secretions, fever, chills, sweats, unintended weight loss, pleurtic or exertional chest pain, orthopnea PND, or leg swelling Pt denies any increase in rescue therapy over baseline, denies waking up needing it or having any early am or nocturnal exacerbations of coughing/wheezing/or dyspnea. A recent CT chest was obtained and  shows stable RUL nodule     Preventive Screening-Counseling & Management  Alcohol-Tobacco     Smoking Status: never  Current Medications (verified): 1)  Boniva 150 Mg  Tabs (Ibandronate Sodium) .... Take 1 Tab By Mouth Once A Month 2)  Phenobarbital 60 Mg  Tabs (Phenobarbital) .... Take 1 Tablet Twice A Day 3)  Crestor 20 Mg Tabs (Rosuvastatin Calcium)  .... Take 1 Tab By Mouth At Bedtime 4)  Prevacid 15 Mg Cpdr (Lansoprazole) .Marland Kitchen.. 1 Capsule By Mouth  Every Morning Before Meal 5)  Multivitamins  Tabs (Multiple Vitamin) .... Take 1 Tablet By Mouth Once A Day 6)  Calcium Carbonate-Vitamin D 600-400 Mg-Unit  Tabs (Calcium Carbonate-Vitamin D) .... Take 1 Tablet By Mouth Three Times A Day 7)  Triamterene-Hctz 75-50 Mg Tabs (Triamterene-Hctz) .... Take 1/2  Tablet By Mouth Once A Day 8)  Tylenol Extra Strength 500 Mg Tabs (Acetaminophen) .... Per Bottle 9)  Nitroglycerin 0.4 Mg Subl (Nitroglycerin) .... One Tablet Under Tongue Every 5 Minutes As Needed For Chest Pain---May Repeat Times Three 10)  Metoprolol Succinate 50 Mg Xr24h-Tab (Metoprolol Succinate) .... Take 1 Tablet  Once Daily 11)  Imodium A-D 2 Mg Tabs (Loperamide Hcl) .... 2 Stat Then 1 After Each Loose Stool As Needed 12)  Aspirin 325 Mg Tabs (Aspirin) .... Take 1 Tablet By Mouth Once A Day 13)  Potassium Chloride Crys Cr 20 Meq Cr-Tabs (Potassium Chloride Crys Cr) .... Take One Tablet By Mouth Daily 14)  Symbicort 160-4.5 Mcg/act Aero (Budesonide-Formoterol Fumarate) .... Inhale 2 Puffs Two Times A Day 15)  Dilt-Cd 120 Mg Xr24h-Cap (Diltiazem Hcl Coated Beads) .... Take 1 Capsule By Mouth Once A Day 16)  Flonase 50 Mcg/act Susp (Fluticasone Propionate) .... 2 Sprays Each Nostril Once Daily 17)  Saline Nasal Spray 0.65 % Soln (Saline) .... 3 Sprays Each Nostril  Three Times A Day 18)  Proair Hfa 108 (90 Base) Mcg/act Aers (Albuterol Sulfate) .... 2 Puffs Every Four Hours As Needed  Allergies (verified): 1)  ! Neosporin Original (Neomycin-Bacitracin-Polymyxin) 2)  ! Codeine 3)  ! Pcn 4)  ! Adhesive Tape  Past History:  Past medical, surgical, family and social histories (including risk factors) reviewed, and no changes noted (except as noted below).  Past Medical History: Reviewed history from 01/21/2009 and no changes required. Current Problems:  CAD (ICD-414.00) CHEST  PAIN (ICD-786.50) HYPERLIPIDEMIA (ICD-272.4) CERUMEN IMPACTION, RIGHT (ICD-380.4) CHOLELITHIASIS (ICD-574.20) IRRITABLE BOWEL SYNDROME (ICD-564.1) ABDOMINAL PAIN (ICD-789.00) GERD (ICD-530.81) TINNITUS, RIGHT (ICD-388.30) SCREENING FOR MALIGNANT NEOPLASM OF THE CERVIX (ICD-V76.2) SEIZURE DISORDER (ICD-780.39) HYPOTHYROIDISM (ICD-244.9) ANEMIA (ICD-285.9) UNS ADVRS EFF OTH RX MEDICINAL&BIOLOGICAL SBSTNC (ZOX-096.04) UTI (ICD-599.0) ALLERGIC RHINITIS (ICD-477.9) EDEMA- LOCALIZED (ICD-782.3) MUSCLE SPASM (ICD-728.85) DEPRESSION, ACUTE (ICD-296.23) ARTHRITIS, BACK (ICD-721.90) EPILEPSY, 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 Hysterectomy  1985 L wrist fx with plates  5409  Family History: Reviewed history from 04/10/2009 and no changes required. M had angina F had MI, CHF  Social History: Reviewed history from 07/31/2009 and no changes required. divorced.   No children. never smoked social alcohol still working: Producer, television/film/video at [employer]  Review of Systems  The patient denies shortness of breath with activity, shortness of breath at rest, productive cough, non-productive cough, coughing up blood, chest pain, irregular heartbeats, acid heartburn, indigestion, loss of appetite, weight change, abdominal pain, difficulty swallowing, sore throat, tooth/dental problems, headaches, nasal congestion/difficulty breathing through nose, sneezing, itching, ear ache, anxiety, depression, hand/feet swelling, joint stiffness or pain, rash, change in color of mucus, and fever.    Vital Signs:  Patient profile:   70 year old female Menstrual status:  hysterectomy Height:      64 inches Weight:      179.38 pounds BMI:     30.90 O2 Sat:      95 % on Room air Temp:     98.2 degrees F oral Pulse rate:   50 / minute BP sitting:   132 / 66  (right arm) Cuff size:    regular  Vitals Entered By: Gweneth Dimitri RN (August 22, 2009 3:47 PM)  O2 Flow:  Room air CC: Follow up for abnormal CT chest.  denies SOB, wheezing, chest tightness, and cough.   Comments Medications reviewed with patient Daytime contact number verified with patient. Gweneth Dimitri RN  August 22, 2009 3:47 PM    Physical Exam  Additional Exam:  GEN: A/Ox3; pleasant , NAD HEENT:  [place]/AT, , EACs-clear, TMs-wnl, NOSE-clear, THROAT-clear NECK:  Supple w/ fair ROM; no JVD; normal carotid impulses w/o bruits; no thyromegaly or nodules palpated; no lymphadenopathy. RESP  Clear to P & A; w/o, wheezes/ rales/ or rhonchi. CARD:  RRR, no m/r/g   GI:   Soft & nt; nml bowel sounds; no organomegaly or masses detected. Musco: Warm bil,  no calf tenderness edema, clubbing, pulses intact Neuro: Intact w/ no focal deficits noted.    Pulmonary Function Test Date: 08/22/2009 4:23 PM Gender: Female  Pre-Spirometry FVC    Value: 1.98 L/min   % Pred: 64.40 % FEV1    Value: 1.62 L     Pred: 2.34 L     % Pred: 69.20 % FEV1/FVC  Value: 81.79 %     % Pred: 106.80 %  Impression & Recommendations:  Problem # 1:  EXTRINSIC  ASTHMA, UNSPECIFIED (ICD-493.00) Assessment Improved Improved asthma with normal spirometry today 3/24 plan No change in inhaled medications.   Maintain treatment program as currently prescribed.  Problem # 2:  LUNG NODULE (ICD-518.89) Assessment: Unchanged stable RUL nodule likely benign plan no further CT scans needed   Medications Added to Medication List This Visit: 1)  Metoprolol Succinate 50 Mg Xr24h-tab (Metoprolol succinate) .... Take 1 tablet  once daily 2)  Imodium A-d 2 Mg Tabs (Loperamide hcl) .... 2 stat then 1 after each loose stool as needed  Complete Medication List: 1)  Boniva 150 Mg Tabs (Ibandronate sodium) .... Take 1 tab by mouth once a month 2)  Phenobarbital 60 Mg Tabs (Phenobarbital) .... Take 1 tablet twice a day 3)  Crestor 20 Mg Tabs (Rosuvastatin  calcium) .... Take 1 tab by mouth at bedtime 4)  Prevacid 15 Mg Cpdr (Lansoprazole) .Marland Kitchen.. 1 capsule by mouth  every morning before meal 5)  Multivitamins Tabs (Multiple vitamin) .... Take 1 tablet by mouth once a day 6)  Calcium Carbonate-vitamin D 600-400 Mg-unit Tabs (Calcium carbonate-vitamin d) .... Take 1 tablet by mouth three times a day 7)  Triamterene-hctz 75-50 Mg Tabs (Triamterene-hctz) .... Take 1/2  tablet by mouth once a day 8)  Tylenol Extra Strength 500 Mg Tabs (Acetaminophen) .... Per bottle 9)  Nitroglycerin 0.4 Mg Subl (Nitroglycerin) .... One tablet under tongue every 5 minutes as needed for chest pain---may repeat times three 10)  Metoprolol Succinate 50 Mg Xr24h-tab (Metoprolol succinate) .... Take 1 tablet  once daily 11)  Imodium A-d 2 Mg Tabs (Loperamide hcl) .... 2 stat then 1 after each loose stool as needed 12)  Aspirin 325 Mg Tabs (Aspirin) .... Take 1 tablet by mouth once a day 13)  Potassium Chloride Crys Cr 20 Meq Cr-tabs (Potassium chloride crys cr) .... Take one tablet by mouth daily 14)  Symbicort 160-4.5 Mcg/act Aero (Budesonide-formoterol fumarate) .... Inhale 2 puffs two times a day 15)  Dilt-cd 120 Mg Xr24h-cap (Diltiazem hcl coated beads) .... Take 1 capsule by mouth once a day 16)  Flonase 50 Mcg/act Susp (Fluticasone propionate) .... 2 sprays each nostril once daily 17)  Saline Nasal Spray 0.65 % Soln (Saline) .... 3 sprays each nostril three times a day 18)  Proair Hfa 108 (90 Base) Mcg/act Aers (Albuterol sulfate) .... 2 puffs every four hours as needed  Other Orders: Spirometry w/Graph (94010) Est. Patient Level IV (39767)  Patient Instructions: 1)  Stay on symbicort two puff twice daily 2)  No further CT scans will be necessary 3)  Return 3 months      CardioPerfect Spirometry  ID: [id number] Patient: Misty Meyer, Misty Meyer DOB: Nov 30, 1940 Age: 70 Years Old Sex: Female Race: White Physician: Judithann Sheen MD Height: 64 Weight:  179.38 Status: Confirmed Past Medical History:  Current Problems:  CAD (ICD-414.00) CHEST PAIN (ICD-786.50) HYPERLIPIDEMIA (ICD-272.4) CERUMEN IMPACTION, RIGHT (ICD-380.4) CHOLELITHIASIS (ICD-574.20) IRRITABLE BOWEL SYNDROME (ICD-564.1) ABDOMINAL PAIN (ICD-789.00) GERD (ICD-530.81) TINNITUS, RIGHT (ICD-388.30) SCREENING FOR MALIGNANT NEOPLASM OF THE CERVIX (ICD-V76.2) SEIZURE DISORDER (ICD-780.39) HYPOTHYROIDISM (ICD-244.9) ANEMIA (ICD-285.9) UNS ADVRS EFF OTH RX MEDICINAL&BIOLOGICAL SBSTNC (IOX-735.32) UTI (ICD-599.0) ALLERGIC RHINITIS (ICD-477.9) EDEMA- LOCALIZED (ICD-782.3) MUSCLE SPASM (ICD-728.85) DEPRESSION, ACUTE (ICD-296.23) ARTHRITIS, BACK (ICD-721.90) EPILEPSY, 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 Delford Field regarding findings on CT.   He did not feel there was need for further workup, and had looked at the CT scan.  TS  Appended Document: Pulmonary OV reviewed

## 2010-07-01 NOTE — Progress Notes (Signed)
Summary: please advise of ankles  Phone Note Call from Patient Call back at [phone]   Caller: Patient-live call Reason for Call: Talk to Nurse Summary of Call: Has ? about her ankles. has edema and cannot get off work to come in. Please advise pt. Initial call taken by: Warnell Forester,  June 06, 2009 9:56 AM  Follow-up for Phone Call        Pt and pharmacy have a disagreement when she filled her Triamterene.  Advised to take her bottle to the pharmacy and see fi she can get this worked out.  If she has a problem, call back. Follow-up by: Lynann Beaver CMA,  June 06, 2009 10:02 AM  Additional Follow-up for Phone Call Additional follow up Details #1::        has she notifed dr Tedra Senegal nurse?  if edema in ankles dr Scotty Court would need to see her to evaluate the swelling - what was the problem about the trimterene? Additional Follow-up by: Pura Spice, RN,  June 06, 2009 11:24 AM    Additional Follow-up for Phone Call Additional follow up Details #2::    the pharmacy says she is attempting to refill it too soon, but they are wrong.   She is going to show them her last refill bottle with the date on it, and if not resolved, she will call back. Follow-up by: Lynann Beaver CMA,  June 06, 2009 12:32 PM

## 2010-07-01 NOTE — Letter (Signed)
Summary: Patient Notice- Polyp Results  [facility]  [address] [place], [place] [address]   Phone: [phone]  Fax: [phone]        June 21, 2009 MRN: [id number]    Misty Meyer [address] [place], [place]  [address]    Dear Misty Meyer,  I am pleased to inform you that the colon polyps removed during your recent colonoscopy were found to be benign (no cancer detected) upon pathologic examination.  I recommend you have a repeat colonoscopy examination in 3 years to look for recurrent polyps, as having colon polyps increases your risk for having recurrent polyps or even colon cancer in the future.  Should you develop new or worsening symptoms of abdominal pain, bowel habit changes or bleeding from the rectum or bowels, please schedule an evaluation with either your primary care physician or with me.  Additional information/recommendations:  __ No further action with gastroenterology is needed at this time. Please      follow-up with your primary care physician for your other healthcare      needs.   Please call us if you are having persistent problems or have questions about your condition that have not been fully answered at this time.  Sincerely,  Hilarie Fredrickson MD  This letter has been electronically signed by your physician.  Appended Document: Patient Notice- Polyp Results Letter mailed 1.25.11

## 2010-07-01 NOTE — Assessment & Plan Note (Signed)
Summary: follow up/consult re: goiter and biopsy on nodule/cjr   Vital Signs:  Patient profile:   70 year old female Menstrual status:  hysterectomy Weight:      174 pounds O2 Sat:      98 % Temp:     98.4 degrees F Pulse rate:   95 / minute BP sitting:   120 / 80  (left arm)  Vitals Entered By: Pura Spice, RN (August 07, 2009 1:21 PM) CC: wants to talk about test upcoming having thyroid bx on march 15 and CT scan on March  21  Is Patient Diabetic? No   History of Present Illness: This 70 year old single female has recently been seen by Dr. Hulda Humphrey and has had numerous studies including ultrasound of the thyroid as well as CT scan of the chest both which were possibly resolved with a thyroid nodule solid on the left, also a lung nodule. She is in no distress my opinion regarding these problems and as to what she should do and I feel that she should continue as planned for the biopsy of the thyroid nodule as well as the biopsy of the lung nodule At this time I feel which is to return to work on 10 March Atrial fibrillation has been controlled No problem with seizures In general patient states he feels good  Allergies: 1)  ! Neosporin Original (Neomycin-Bacitracin-Polymyxin) 2)  ! Codeine 3)  ! Pcn 4)  ! Adhesive Tape  Past History:  Past Medical History: Last updated: 01/21/2009 Current Problems:  CAD (ICD-414.00) CHEST PAIN (ICD-786.50) HYPERLIPIDEMIA (ICD-272.4) CERUMEN IMPACTION, RIGHT (ICD-380.4) CHOLELITHIASIS (ICD-574.20) IRRITABLE BOWEL SYNDROME (ICD-564.1) ABDOMINAL PAIN (ICD-789.00) GERD (ICD-530.81) TINNITUS, RIGHT (ICD-388.30) SCREENING FOR MALIGNANT NEOPLASM OF THE CERVIX (ICD-V76.2) SEIZURE DISORDER (ICD-780.39) HYPOTHYROIDISM (ICD-244.9) ANEMIA (ICD-285.9) UNS ADVRS EFF OTH RX MEDICINAL&BIOLOGICAL SBSTNC (LOV-564.33) UTI (ICD-599.0) ALLERGIC RHINITIS (ICD-477.9) EDEMA- LOCALIZED (ICD-782.3) MUSCLE SPASM (ICD-728.85) DEPRESSION, ACUTE  (ICD-296.23) ARTHRITIS, BACK (ICD-721.90) EPILEPSY, 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   No children. never smoked social alcohol still working: Producer, television/film/video at [employer]  Risk Factors: Smoking Status: never (07/31/2009)  Review of Systems  The patient denies anorexia, fever, weight loss, weight gain, vision loss, decreased hearing, hoarseness, chest pain, syncope, dyspnea on exertion, peripheral edema, prolonged cough, headaches, hemoptysis, abdominal pain, melena, hematochezia, severe indigestion/heartburn, hematuria, incontinence, genital sores, muscle weakness, suspicious skin lesions, transient blindness, difficulty walking, depression, unusual weight change, abnormal bleeding, enlarged lymph nodes, angioedema, breast masses, and testicular masses.    Physical Exam  General:  Well-developed,well-nourished,in no acute distress; alert,appropriate and cooperative throughout examination Head:  Normocephalic and atraumatic without obvious abnormalities. No apparent alopecia or balding. Eyes:  No corneal or conjunctival inflammation noted. EOMI. Perrla. Funduscopic exam benign, without hemorrhages, exudates or papilledema. Vision grossly normal. Ears:  External ear exam shows no significant lesions or deformities.  Otoscopic examination reveals clear canals, tympanic membranes are intact bilaterally without bulging, retraction, inflammation or discharge. Hearing is grossly normal bilaterally. Nose:  External nasal examination shows no deformity or inflammation. Nasal mucosa are pink and moist without lesions or exudates. Mouth:  Oral mucosa and oropharynx without lesions or exudates.  Teeth in good repair. Neck:  No deformities, masses, or tenderness noted. Lungs:  Normal respiratory effort, chest  expands symmetrically. Lungs are clear to auscultation, no crackles or wheezes. Heart:  Normal  rate and regular rhythm. S1 and S2 normal without gallop, murmur, click, rub or other extra sounds. Abdomen:  Bowel sounds positive,abdomen soft and non-tender without masses, organomegaly or hernias noted. Extremities:  No clubbing, cyanosis, edema, or deformity noted with normal full range of motion of all joints.     Impression & Recommendations:  Problem # 1:  LUNG NODULE (ICD-518.89) Assessment Unchanged  Problem # 2:  THYROID NODULE (ICD-241.0) Assessment: Unchanged  Problem # 3:  ATRIAL FIBRILLATION (ICD-427.31) Assessment: Improved  Her updated medication list for this problem includes:    Metoprolol Succinate 50 Mg Xr24h-tab (Metoprolol succinate) .Marland Kitchen... Take 1 tablet  bid    Aspirin 325 Mg Tabs (Aspirin) .Marland Kitchen... Take 1 tablet by mouth once a day    Dilt-cd 120 Mg Xr24h-cap (Diltiazem hcl coated beads) .Marland Kitchen... Take 1 capsule by mouth once a day  Problem # 4:  TREMOR (ICD-781.0) Assessment: Improved  Problem # 5:  SEIZURE DISORDER (ICD-780.39) Assessment: Improved  Her updated medication list for this problem includes:    Phenobarbital 60 Mg Tabs (Phenobarbital) .Marland Kitchen... Take 1 tablet twice a day  Complete Medication List: 1)  Boniva 150 Mg Tabs (Ibandronate sodium) .... Take 1 tab by mouth once a month 2)  Phenobarbital 60 Mg Tabs (Phenobarbital) .... Take 1 tablet twice a day 3)  Crestor 20 Mg Tabs (Rosuvastatin calcium) .... Take 1 tab by mouth at bedtime 4)  Prevacid 15 Mg Cpdr (Lansoprazole) .Marland Kitchen.. 1 capsule by mouth  every morning before meal 5)  Multivitamins Tabs (Multiple vitamin) .... Take 1 tablet by mouth once a day 6)  Calcium Carbonate-vitamin D 600-400 Mg-unit Tabs (Calcium carbonate-vitamin d) .... Take 1 tablet by mouth three times a day 7)  Triamterene-hctz 75-50 Mg Tabs (Triamterene-hctz) .... Take 1/2  tablet by mouth once a day 8)  Tylenol Extra Strength 500  Mg Tabs (Acetaminophen) .... Per bottle 9)  Nitroglycerin 0.4 Mg Subl (Nitroglycerin) .... One tablet under tongue every 5 minutes as needed for chest pain---may repeat times three 10)  Metoprolol Succinate 50 Mg Xr24h-tab (Metoprolol succinate) .... Take 1 tablet  bid 11)  Imodium A-d 2 Mg Tabs (Loperamide hcl) .... 2 stat then 1 after each loose stool 12)  Aspirin 325 Mg Tabs (Aspirin) .... Take 1 tablet by mouth once a day 13)  Potassium Chloride Crys Cr 20 Meq Cr-tabs (Potassium chloride crys cr) .... Take one tablet by mouth daily 14)  Symbicort 160-4.5 Mcg/act Aero (Budesonide-formoterol fumarate) .... Inhale 2 puffs two times a day 15)  Dilt-cd 120 Mg Xr24h-cap (Diltiazem hcl coated beads) .... Take 1 capsule by mouth once a day 16)  Flonase 50 Mcg/act Susp (Fluticasone propionate) .... 2 sprays each nostril once daily 17)  Saline Nasal Spray 0.65 % Soln (Saline) .... 3 sprays each nostril three times a day 18)  Proair Hfa 108 (90 Base) Mcg/act Aers (Albuterol sulfate) .... 2 puffs every four hours as needed  Patient Instructions: 1)  Planned to have a biopsy of both the thyroid nodule as well as a lung nodule. 2)  Continue her other regular medications

## 2010-07-01 NOTE — Assessment & Plan Note (Signed)
Summary: cough/puffy eyes/laryngitis/cjr   Vital Signs:  Patient profile:   70 year old female Menstrual status:  hysterectomy Weight:      178 pounds O2 Sat:      93 % Temp:     98.3 degrees F Pulse rate:   89 / minute BP sitting:   124 / 76  (left arm)  Vitals Entered By: Pura Spice, RN (July 17, 2009 2:56 PM) CC: eyes puffy and c/o laryngitis  sore throat last nite but not today  Is Patient Diabetic? No   History of Present Illness: this 70 year old white single female who has a past history of seizure disorder but no seizure and approximately 3 years. Today patient comes in complaining of sore throat last night and today she complains of nasal congestion no cough but suggestive of wheezing ear she has not had a fever but has had episodes of chills no earache no nausea vomiting or diarrhea over the past 24 hours she has noticed some minimal wheezing but a nonproductive cough Chief complaint low nasal congestion with some postnasal drainage  Allergies: 1)  ! Neosporin Original (Neomycin-Bacitracin-Polymyxin) 2)  ! Codeine 3)  ! Pcn  Past History:  Past Medical History: Last updated: 01/21/2009 Current Problems:  CAD (ICD-414.00) CHEST PAIN (ICD-786.50) HYPERLIPIDEMIA (ICD-272.4) CERUMEN IMPACTION, RIGHT (ICD-380.4) CHOLELITHIASIS (ICD-574.20) IRRITABLE BOWEL SYNDROME (ICD-564.1) ABDOMINAL PAIN (ICD-789.00) GERD (ICD-530.81) TINNITUS, RIGHT (ICD-388.30) SCREENING FOR MALIGNANT NEOPLASM OF THE CERVIX (ICD-V76.2) SEIZURE DISORDER (ICD-780.39) HYPOTHYROIDISM (ICD-244.9) ANEMIA (ICD-285.9) UNS ADVRS EFF OTH RX MEDICINAL&BIOLOGICAL SBSTNC (ZOX-096.04) UTI (ICD-599.0) ALLERGIC RHINITIS (ICD-477.9) EDEMA- LOCALIZED (ICD-782.3) MUSCLE SPASM (ICD-728.85) DEPRESSION, ACUTE (ICD-296.23) ARTHRITIS, BACK (ICD-721.90) EPILEPSY, 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  No children.   non-smoker,no drugabuse, occasional drinker   Risk Factors: Smoking Status: quit (03/03/2007)  Review of Systems  The patient denies anorexia, fever, weight loss, weight gain, vision loss, decreased hearing, hoarseness, chest pain, syncope, dyspnea on exertion, peripheral edema, prolonged cough, headaches, hemoptysis, abdominal pain, melena, hematochezia, severe indigestion/heartburn, hematuria, incontinence, genital sores, muscle weakness, suspicious skin lesions, transient blindness, difficulty walking, depression, unusual weight change, abnormal bleeding, enlarged lymph nodes, angioedema, breast masses, and testicular masses.    Physical Exam  General:  Well-developed,well-nourished,in no acute distress; alert,appropriate and cooperative throughout examination Head:  Normocephalic and atraumatic without obvious abnormalities. No apparent alopecia or balding. Eyes:  No corneal or conjunctival inflammation noted. EOMI. Perrla. Funduscopic exam benign, without hemorrhages, exudates or papilledema. Vision grossly normal. Ears:  External ear exam shows no significant lesions or deformities.  Otoscopic examination reveals clear canals, tympanic membranes are intact bilaterally without bulging, retraction, inflammation or discharge. Hearing is grossly normal bilaterally. Nose:  swollen nasal mucosa with slightly yellow drainage and maxillary tenderness bilaterally Mouth:  pharyngeal erythema.  anterior cervical nodes Chest Wall:  No deformities, masses, or tenderness noted. Lungs:  minimal rhonchi bilaterally with expiratory wheeze but minimal findings Heart:  Normal rate and regular rhythm. S1 and S2 normal without gallop, murmur, click, rub or other extra sounds. Abdomen:  Bowel sounds positive,abdomen soft and non-tender  without masses, organomegaly or hernias noted. Msk:  tenderness at a.c. bursa right shoulder Extremities:  No clubbing, cyanosis, edema, or deformity noted with normal full range of motion of all joints.  Impression & Recommendations:  Problem # 1:  ACUTE MAXILLARY SINUSITIS (ICD-461.0) Assessment New  Her updated medication list for this problem includes:    Zithromax Z-pak 250 Mg Tabs (Azithromycin) .Marland Kitchen... 2 stat then 1 per day    Mucinex D 8258785701 Mg Xr12h-tab (Pseudoephedrine-guaifenesin) .Marland Kitchen... 1 bid  Problem # 2:  ACUTE BRONCHITIS (ICD-466.0) Assessment: New  Her updated medication list for this problem includes:    Zithromax Z-pak 250 Mg Tabs (Azithromycin) .Marland Kitchen... 2 stat then 1 per day    Mucinex D 8258785701 Mg Xr12h-tab (Pseudoephedrine-guaifenesin) .Marland Kitchen... 1 bid  Orders: Prescription Created Electronically [phone])  Problem # 3:  ACUTE PHARYNGITIS (ICD-462) Assessment: New  Her updated medication list for this problem includes:    Ibuprofen 200 Mg Tabs (Ibuprofen) .Marland Kitchen... As needed    Aspir-low 81 Mg Tbec (Aspirin) .Marland Kitchen... 1 each day    Zithromax Z-pak 250 Mg Tabs (Azithromycin) .Marland Kitchen... 2 stat then 1 per day    Diclofenac Sodium 75 Mg Tbec (Diclofenac sodium) .Marland Kitchen... 1 two times a day pc for bursitis  Problem # 4:  LARYNGITIS, ACUTE (ICD-464.00) Assessment: New  Problem # 5:  COSTOCHONDRITIS, LEFT (ICD-733.6) Assessment: Improved  Her updated medication list for this problem includes:    Boniva 150 Mg Tabs (Ibandronate sodium) .Marland Kitchen... Take 1 monthly    Calcium Carbonate-vitamin D 600-400 Mg-unit Tabs (Calcium carbonate-vitamin d) .Marland Kitchen... Take 1 tablet by mouth three times a day  Problem # 6:  GERD (ICD-530.81) Assessment: Improved  Her updated medication list for this problem includes:    Prevacid 15 Mg Cpdr (Lansoprazole) .Marland Kitchen... 1-2 qd  Problem # 7:  SEIZURE DISORDER (ICD-780.39) Assessment: Improved  Her updated medication list for this problem includes:     Phenobarbital 60 Mg Tabs (Phenobarbital) .Marland Kitchen... Take 1 tablet twice a day  Problem # 8:  BURSITIS, ACROMIOCLAVICULAR, RIGHT (ICD-726.10) Assessment: Improved  Complete Medication List: 1)  Boniva 150 Mg Tabs (Ibandronate sodium) .... Take 1 monthly 2)  Phenobarbital 60 Mg Tabs (Phenobarbital) .... Take 1 tablet twice a day 3)  Crestor 20 Mg Tabs (Rosuvastatin calcium) .... Take one tablet by mouth daily. 4)  Prevacid 15 Mg Cpdr (Lansoprazole) .Marland Kitchen.. 1-2 qd 5)  Align Caps (Misc intestinal flora regulat) .Marland Kitchen.. 1 qd 6)  Multivitamins Tabs (Multiple vitamin) .... Take 1 tablet by mouth once a day 7)  Calcium Carbonate-vitamin D 600-400 Mg-unit Tabs (Calcium carbonate-vitamin d) .... Take 1 tablet by mouth three times a day 8)  Triamterene-hctz 75-50 Mg Tabs (Triamterene-hctz) .... Take 1 tablet by mouth once a day 9)  Ibuprofen 200 Mg Tabs (Ibuprofen) .... As needed 10)  Nitroglycerin 0.4 Mg Subl (Nitroglycerin) .... One tablet under tongue every 5 minutes as needed for chest pain---may repeat times three 11)  Metoprolol Succinate 25 Mg Xr24h-tab (Metoprolol succinate) .... Take one tablet by mouth daily 12)  Imodium A-d 2 Mg Tabs (Loperamide hcl) .... 2 stat then 1 after each loose stool 13)  Aspir-low 81 Mg Tbec (Aspirin) .Marland Kitchen.. 1 each day 14)  Potassium Chloride Crys Cr 20 Meq Cr-tabs (Potassium chloride crys cr) .... Take one tablet by mouth daily 15)  Zithromax Z-pak 250 Mg Tabs (Azithromycin) .... 2 stat then 1 per day 16)  Mucinex D 8258785701 Mg Xr12h-tab (Pseudoephedrine-guaifenesin) .Marland Kitchen.. 1 bid 17)  Diclofenac Sodium 75 Mg Tbec (Diclofenac sodium) .Marland Kitchen.. 1 two times a day pc for bursitis  Patient Instructions: 1)  Acute laryngitis, acutebronchitis, acute pharyngitis, maxillary sinsitis 2)  Zpack 2 now then 1 per  day 3)  Mucinex D maximum strength two times a day 4)  drink more fluids to stay hydrated 5)  bursitis much better seen before but restart diclofenac 75 mg  b.i.d. Prescriptions: DICLOFENAC SODIUM 75 MG TBEC (DICLOFENAC SODIUM) 1 two times a day pc for bursitis  #60 x 11   Entered and Authorized by:   Judithann Sheen MD   Signed by:   Judithann Sheen MD on 07/17/2009   Method used:   Electronically to        [facility]. [phone]* (retail)       [address]       [place], [place]  [address]       Ph: [phone]       Fax: [phone]   RxID:   [id number] ZITHROMAX Z-PAK 250 MG TABS (AZITHROMYCIN) 2 stat then 1 per day  #1 pkge x 0   Entered and Authorized by:   Judithann Sheen MD   Signed by:   Judithann Sheen MD on 07/17/2009   Method used:   Electronically to        [facility]. [phone]* (retail)       [address]       [place], [place]  [address]       Ph: [phone]       Fax: [phone]   RxID:   [id number]

## 2010-07-01 NOTE — Progress Notes (Signed)
Summary: returned call  Phone Note Call from Patient Call back at Home Phone [phone]   Caller: Patient Call For: wright Summary of Call: pt "believes" crystal may have just called her.  Initial call taken by: Tivis Ringer, CNA,  December 30, 2009 4:26 PM  Follow-up for Phone Call        I did not try to call this pt and Dr. Delford Field is off.  Crystal Jones RN  December 30, 2009 5:14 PM  Called pt to inform her of this but she stated she was actually calling to see if we received the records from [facility].  I advised her we did receive them and were give to Dr. Delford Field to review.  She verbalized understanding and voiced no further questions. Follow-up by: Gweneth Dimitri RN,  December 30, 2009 5:16 PM

## 2010-07-01 NOTE — H&P (Signed)
[name], Meyer NO.:  [id number]  MEDICAL RECORD NO.:  [id number]          PATIENT TYPE:  EMS  LOCATION:  [facility]                         FACILITY:  [facility]  PHYSICIAN:  Veverly Fells. Excell Seltzer, MD  DATE OF BIRTH:  [birth year]  DATE OF ADMISSION:  06/17/2010 DATE OF DISCHARGE:                             HISTORY & PHYSICAL   PRIMARY CARDIOLOGIST:  Arturo Morton. [name], MD, [facility]  PRIMARY CARE PHYSICIAN:  Tawny Asal, MD  CHIEF COMPLAINT:  Palpitations.  PATIENT PROFILE:  This is a 70 year old female with history of paroxysmal atrial fibrillation with rapid ventricular response who presents to the [facility] Emergency Department with complaints of palpitations and noted to be in atrial fibrillation with rapid ventricular response at 148 beats per minute.  PAST MEDICAL HISTORY: 1. Paroxysmal atrial fibrillation with rapid ventricular response.     a.     Three hospitalizations in the past year with spontaneous      conversion to normal sinus rhythm. 2. Nonobstructive coronary artery disease status post cardiac     catheterization in August 2010.     a.     Ostial 50% central narrowing of the left anterior descending      artery.     b.     Well preserved left ventricular function. 3. Dyslipidemia. 4. Multinodular goiter. 5. Gastroesophageal reflux disease. 6. Irritable bowel syndrome. 7. Asthma. 8. Cholelithiasis. 9. Osteopenia. 10.History of epilepsy. 11.Status post hysterectomy. 12.Status post left wrist fracture with plate.  HISTORY OF PRESENT ILLNESS:  This is a 70 year old Caucasian female with the above-stated problem list who states she is in her usual state of health until waking this morning approximately 5:00 a.m. and noticing palpitations.  With the patient's history of paroxysmal atrial fibrillation, she called EMS and was found to be in atrial fibrillation with rapid ventricular response at a rate of 114 beats per minute.  The patient was  brought to the [facility] Emergency Department.  In the emergency room, the patient was noted to have rates up to 148 beats per minute.  She was given Cardizem 25 mg bolus x2 and then started on a Cardizem drip at 5 mg per hour.  Upon evaluation, the patient is still with rates between 112-130.  The patient denies any shortness of breath with her episodes.  She does state she has a chest lump in the distal sternum area that has improved.  Her initial point-of-care markers are negative x1.  Her potassium was depleted at 3.2 and she has been given 40 mEq to supplement.  Of note, the patient states she does have irritable bowel syndrome and has episodes of diarrhea.  This occurred on Thursday, 5 days prior to admission where she took 2 Imodium and has since been constipated. Other than that, she has been well.  She denies any fever, chills, headache, nausea, or vomiting.  She does have intermittent lower extremity edema that is relieved at rest.  The patient's echocardiogram in 2010 showed an ejection fraction of 60-65%.  Cardiology was asked to admit the patient for further evaluation.  SOCIAL HISTORY:  The patient  lives in [place] by herself.  She is divorced.  She is a Interior and spatial designer at [employer].  She denies tobacco or illicit drug use.  She will have alcohol socially.  FAMILY HISTORY:  Noncontributory for early coronary artery disease.  Her mother and father are both deceased.  Her mother had history of angina and her father had history of congestive heart failure.  ALLERGIES: 1. NEOSPORIN. 2. CODEINE. 3. PENICILLIN. 4. ADHESIVE TAPE.  HOME MEDICATIONS: 1. Imodium 2 mg 1 capsule daily as needed for diarrhea. 2. QVAR inhalation 40 mcg inhaled 2 puffs twice daily. 3. Tylenol Extra Strength 500 mg 2 tablets twice daily as needed for     pain. 4. Prevacid 50 mg 1 capsule twice daily. 5. Phenobarbital 60 mg 1 tablet twice daily. 6. Nitroglycerin sublingual 1 tablet  every 5 minutes as needed up to 3     doses for chest pain. 7. Multivitamin 1 tablet daily. 8. Metoprolol XL succinate 50 mg 1 tablet daily. 9. Flonase spray 2 sprays every morning. 10.Diltiazem CD 180 mg 1 capsule daily. 11.Crestor 20 mg 1 tablet daily. 12.Calcium/vitamin D 600/400 mg 1 tablet 3 times a day. 13.Boniva 150 mg 1 tablet monthly. 14.Aspirin 325 mg 1 tablet every day.  REVIEW OF SYSTEMS:  All pertinent positives and negatives as stated in HPI.  All other systems have been reviewed and are negative.  CODE STATUS:  Full.  PHYSICAL EXAMINATION:  VITAL SIGNS:  Temperature 97.5, pulse 112-148, respiration 14, blood pressure 112-186/66-103, O2 saturation 99% on room air.  GENERAL:  This is an obese Caucasian female.  She is polite.  She is in no acute distress. HEENT:  Normal. NECK:  Supple without bruit or JVD. HEART:  Irregularly irregular with S1, S2.  No murmur, rub, or gallop noted.  PMI is normal.  Pulses are 2+ and equal bilaterally. LUNGS:  Clear to auscultation bilaterally without wheezes, rales, or rhonchi. ABDOMEN:  Soft, nontender, positive bowel sounds x4. EXTREMITIES:  No clubbing, cyanosis, or edema. MUSCULOSKELETAL:  No joint deformities or effusions. NEURO:  Alert and oriented x3, cranial nerves II through XII grossly intact.  Chest x-ray showing no acute cardiopulmonary disease.  Lingular and right middle lobe atelectasis have improved.  EKG showing atrial fibrillation with rapid ventricular response and nonspecific ST-T-wave changes.  LABORATORY DATA:  WBC of 8.11, hemoglobin 11.3, hematocrit 35.5, platelets 232,000.  Sodium 142, potassium 3.2, chloride 109, bicarb 24, BUN 23, creatinine 1.06.  Point-of-care markers negative x1.  ASSESSMENT AND PLAN:  This is a 70 year old female with multiple hospitalizations for atrial fibrillation with rapid ventricular response who now presents with the same.  It seems that antiarrhythmic therapy should be  considered.  Dr. Excell Seltzer has reviewed catheterization note from Dr. [name] and the patient has ostial LAD stenosis which may preclude use of flecainide.  Tikosyn may be a consideration.  But at this time we will continue diltiazem drip, increase the patient's home dose of metoprolol to twice daily and place the patient on IV heparin in case she should need a cardioversion.  The patient's CHAD2 is currently 0, so there was no need for a long-term anticoagulation.  We will also ask our electrophysiologist to see the patient.     Leonette Monarch, PA-C   ______________________________ Veverly Fells. Excell Seltzer, MD    NB/MEDQ  D:  06/17/2010  T:  06/17/2010  Job:  [id number]  cc:   Arturo Morton. [name], MD, [facility] Ellin Saba., MD  Electronically Signed  by Alen Blew P.A. on 06/19/2010 03:34:59 PM Electronically Signed by Tonny Bollman MD on 07/01/2010 04:52:56 AM

## 2010-07-03 NOTE — Progress Notes (Signed)
Summary: Rx Refill  Phone Note Refill Request Call back at Home Phone [phone] Message from:  Patient on June 03, 2010 12:58 PM  Refills Requested: Medication #1:  PHENOBARBITAL 60 MG  TABS take 1 tablet twice a day  Method Requested: Electronic Initial call taken by: Trixie Dredge,  June 03, 2010 12:58 PM  Follow-up for Phone Call        faxed to pharmacy Follow-up by: Kern Reap CMA Duncan Dull),  June 03, 2010 3:03 PM

## 2010-07-03 NOTE — Assessment & Plan Note (Signed)
Summary: Pulmonary OV   Primary Provider/Referring Provider:  Judithann Sheen MD  CC:  Followup.  Pt states doinf well with qvar.  She states that her breathing is about the same- no better or worse.  Cough has improved.Misty Meyer  History of Present Illness: 70 yo female with known hx of  asthmatic bronchitis, abn CXR with LLL scar,  ?  MAC ? TB in past.    November 26, 2009 3:11 PM Since 3/11: the pt notes  dyspnea and cough are better.  Not clearing throat as much.  The pt saw [facility] one week ago.     There is a ? of TB dx  years ago in early 2000.  The pt was living in [place] and was  rx with abx for 6months.  This was  Supervised intermittent.rx. This sounded like rx for active TB.  No records here. The pt was in [place], [employer].    Pt denies any significant sore throat, nasal congestion or excess secretions, fever, chills, sweats, unintended weight loss, pleurtic or exertional chest pain, orthopnea PND, or leg swelling Pt denies any increase in rescue therapy over baseline, denies waking up needing it or having any early am or nocturnal exacerbations of coughing/wheezing/or dyspnea.  March 25, 2010 4:24 PM The pt was  in hosp for PAF.  10/22-1023.  The pt spontaneously converted to NSR.  Now there is no chest pain or mucus.  The dyspnea is better.  The pt previously in 3/11 had PNA and AB flare.  The pt is not clearing throat now.  Records from [place] received. 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  May 06, 2010 10:30 AM The pt had to go to ED thanksgiving morning. Pt had laryngitis .  The  K low ?due to diuretic. The pt  saw Cardiology and stopped K and diuretic. Pt notes cough and dyspnea are at baseline. No other new issues.    Preventive Screening-Counseling & Management  Alcohol-Tobacco     Smoking Status: never  Current Medications (verified): 1)  Boniva 150 Mg  Tabs (Ibandronate  Sodium) .... Take 1 Tab By Mouth Once A Month 2)  Phenobarbital 60 Mg  Tabs (Phenobarbital) .... Take 1 Tablet Twice A Day 3)  Crestor 20 Mg Tabs (Rosuvastatin Calcium) .... Take 1 Tab By Mouth At Bedtime 4)  Prevacid 15 Mg Cpdr (Lansoprazole) .Misty Meyer.. 1 Capsule By Mouth  Every Morning Before Meal 5)  Multivitamins  Tabs (Multiple Vitamin) .... Take 1 Tablet By Mouth Once A Day 6)  Calcium Carbonate-Vitamin D 600-400 Mg-Unit  Tabs (Calcium Carbonate-Vitamin D) .... Take 1 Tablet By Mouth Three Times A Day 7)  Tylenol Extra Strength 500 Mg Tabs (Acetaminophen) .... Per Bottle 8)  Nitroglycerin 0.4 Mg Subl (Nitroglycerin) .... One Tablet Under Tongue Every 5 Minutes As Needed For Chest Pain---May Repeat Times Three 9)  Metoprolol Succinate 50 Mg Xr24h-Tab (Metoprolol Succinate) .... Take 1 Tablet  Once Daily 10)  Imodium A-D 2 Mg Tabs (Loperamide Hcl) .... 2 Stat Then 1 After Each Loose Stool As Needed 11)  Aspirin 325 Mg Tabs (Aspirin) .... Take 1 Tablet By Mouth Once A Day 12)  Qvar 40 Mcg/act  Aers (Beclomethasone Dipropionate) .... Two Puffs Twice Daily 13)  Dilt-Cd 180 Mg Xr24h-Cap (Diltiazem Hcl Coated Beads) .... Take One Capsule Daily 14)  Flonase 50 Mcg/act Susp (  Fluticasone Propionate) .... 2 Sprays Each Nostril Once Daily 15)  Saline Nasal Spray 0.65 % Soln (Saline) .... 3 Sprays Each Nostril Three Times A Day 16)  Diclofenac Sodium 75 Mg Tbec (Diclofenac Sodium) .Misty Meyer.. 1 Two Times A Day Pc For Arthritis 17)  Flexeril 10 Mg Tabs (Cyclobenzaprine Hcl) .Misty Meyer.. 1 Morn,  Midafternoon and Hs For Muscle Spasm 18)  Vitamin C 500 Mg Tabs (Ascorbic Acid) .... Take 1 Tablet By Mouth Once A Day 19)  Hepa Filter Mask .... Use As Directed 20)  Proair Hfa 108 (90 Base) Mcg/act Aers (Albuterol Sulfate) .... 2 Puffs Every 4 Hour As Needed  Allergies (verified): 1)  ! Neosporin Original (Neomycin-Bacitracin-Polymyxin) 2)  ! Codeine 3)  ! Pcn 4)  ! Adhesive Tape  Past History:  Past medical,  surgical, family and social histories (including risk factors) reviewed, and no changes noted (except as noted below).  Past Medical History: Reviewed history from 03/25/2010 and no changes required. Current Problems:  CAD (ICD-414.00) CHEST PAIN (ICD-786.50) HYPERLIPIDEMIA (ICD-272.4) CERUMEN IMPACTION, RIGHT (ICD-380.4) CHOLELITHIASIS (ICD-574.20) IRRITABLE BOWEL SYNDROME (ICD-564.1) ABDOMINAL PAIN (ICD-789.00) GERD (ICD-530.81) TINNITUS, RIGHT (ICD-388.30) SCREENING FOR MALIGNANT NEOPLASM OF THE CERVIX (ICD-V76.2) SEIZURE DISORDER (ICD-780.39) HYPOTHYROIDISM (ICD-244.9) ANEMIA (ICD-285.9) UNS ADVRS EFF OTH RX MEDICINAL&BIOLOGICAL SBSTNC (ZOX-096.04) UTI (ICD-599.0) ALLERGIC RHINITIS (ICD-477.9) EDEMA- LOCALIZED (ICD-782.3) MUSCLE SPASM (ICD-728.85) DEPRESSION, ACUTE (ICD-296.23) ARTHRITIS, BACK (ICD-721.90) EPILEPSY, 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 if cultured or empiric      -[place], [place]. Atrial Fibrillation    -Nonsustained.  Recurrent 10/22- 03/23/10  Past Surgical History: Reviewed history from 01/21/2009 and no changes required. Hysterectomy  1985 L wrist fx with plates  5409  Family History: Reviewed history from 04/10/2009 and no changes required. M had angina F had MI, CHF  Social History: Reviewed history from 07/31/2009 and no changes required. divorced.   No children. never smoked social alcohol still working: Producer, television/film/video at [employer]  Review of Systems       The patient complains of shortness of breath with activity.  The patient denies shortness of breath at rest, productive cough, non-productive cough, coughing up blood, chest pain, irregular heartbeats, acid heartburn, indigestion, loss of appetite, weight change, abdominal pain, difficulty swallowing, sore throat, tooth/dental problems, headaches, nasal congestion/difficulty breathing through nose, sneezing,  itching, ear ache, anxiety, depression, hand/feet swelling, joint stiffness or pain, rash, change in color of mucus, and fever.    Vital Signs:  Patient profile:   70 year old female Menstrual status:  hysterectomy Weight:      179 pounds O2 Sat:      95 % on Room air Temp:     97.4 degrees F oral Pulse rate:   84 / minute BP sitting:   112 / 62  (left arm)  Vitals Entered By: Vernie Murders (May 06, 2010 10:13 AM)  O2 Flow:  Room air  Physical Exam  Additional Exam:  GEN: A/Ox3; pleasant , NAD HEENT:  [place]/AT, , EACs-clear, TMs-wnl, NOSE-clear, THROAT-clear NECK:  Supple w/ fair ROM; no JVD; normal carotid impulses w/o bruits; no thyromegaly or nodules palpated; no lymphadenopathy. RESP  distant BS, no wheeze CARD:  RRR, no m/r/g   GI:   Soft & nt; nml bowel sounds; no organomegaly or masses detected. Musco: Warm bil,  no calf tenderness edema, clubbing, pulses intact Neuro: Intact w/ no focal deficits noted.    Impression & Recommendations:  Problem # 1:  EXTRINSIC  ASTHMA, UNSPECIFIED (ICD-493.00) Assessment Unchanged cont qvar  Complete Medication List: 1)  Boniva 150 Mg Tabs (Ibandronate sodium) .... Take 1 tab by mouth once a month 2)  Phenobarbital 60 Mg Tabs (Phenobarbital) .... Take 1 tablet twice a day 3)  Crestor 20 Mg Tabs (Rosuvastatin calcium) .... Take 1 tab by mouth at bedtime 4)  Prevacid 15 Mg Cpdr (Lansoprazole) .Misty Meyer.. 1 capsule by mouth  every morning before meal 5)  Multivitamins Tabs (Multiple vitamin) .... Take 1 tablet by mouth once a day 6)  Calcium Carbonate-vitamin D 600-400 Mg-unit Tabs (Calcium carbonate-vitamin d) .... Take 1 tablet by mouth three times a day 7)  Tylenol Extra Strength 500 Mg Tabs (Acetaminophen) .... Per bottle 8)  Nitroglycerin 0.4 Mg Subl (Nitroglycerin) .... One tablet under tongue every 5 minutes as needed for chest pain---may repeat times three 9)  Metoprolol Succinate 50 Mg Xr24h-tab (Metoprolol succinate) .... Take  1 tablet  once daily 10)  Imodium A-d 2 Mg Tabs (Loperamide hcl) .... 2 stat then 1 after each loose stool as needed 11)  Aspirin 325 Mg Tabs (Aspirin) .... Take 1 tablet by mouth once a day 12)  Qvar 40 Mcg/act Aers (Beclomethasone dipropionate) .... Two puffs twice daily 13)  Dilt-cd 180 Mg Xr24h-cap (Diltiazem hcl coated beads) .... Take one capsule daily 14)  Flonase 50 Mcg/act Susp (Fluticasone propionate) .... 2 sprays each nostril once daily 15)  Saline Nasal Spray 0.65 % Soln (Saline) .... 3 sprays each nostril three times a day 16)  Diclofenac Sodium 75 Mg Tbec (Diclofenac sodium) .Misty Meyer.. 1 two times a day pc for arthritis 17)  Flexeril 10 Mg Tabs (Cyclobenzaprine hcl) .Misty Meyer.. 1 morn,  midafternoon and hs for muscle spasm 18)  Vitamin C 500 Mg Tabs (Ascorbic acid) .... Take 1 tablet by mouth once a day 19)  Hepa Filter Mask  .... Use as directed 20)  Proair Hfa 108 (90 Base) Mcg/act Aers (Albuterol sulfate) .... 2 puffs every 4 hour as needed  Other Orders: Est. Patient Level III (16109)  Patient Instructions: 1)  No change in medications 2)  Return in    4      months  Prevention & Chronic Care Immunizations   Influenza vaccine: Historical  (03/23/2010)   Influenza vaccine due: 03/13/2009    Tetanus booster: 06/01/2002: historical   Tetanus booster due: 06/01/2012    Pneumococcal vaccine: Historical  (03/23/2010)   Pneumococcal vaccine due: None    H. zoster vaccine: Not documented  Colorectal Screening   Hemoccult: Not documented    Colonoscopy: DONE  (06/18/2009)   Colonoscopy due: 06/2012  Other Screening   Pap smear: NEGATIVE FOR INTRAEPITHELIAL LESIONS OR MALIGNANCY.  (04/10/2009)   Pap smear due: 06/01/2006    Mammogram: Normal  (04/11/2010)    DXA bone density scan: Not documented   Smoking status: never  (05/06/2010)  Lipids   Total Cholesterol: 136  (09/18/2009)   LDL: 65  (09/18/2009)   LDL Direct: 137.0  (10/23/2008)   HDL: 55.10  (09/18/2009)    Triglycerides: 82.0  (09/18/2009)    SGOT (AST): 25  (09/18/2009)   SGPT (ALT): 26  (09/18/2009)   Alkaline phosphatase: 74  (09/18/2009)   Total bilirubin: 0.2  (09/18/2009)  Self-Management Support :    Lipid self-management support: Not documented     Appended Document: Pulmonary OV review history and treatment

## 2010-07-03 NOTE — Assessment & Plan Note (Signed)
Summary: follow up/cjr rsc bmp/njr   Vital Signs:  Patient profile:   70 year old female Menstrual status:  hysterectomy Weight:      179 pounds O2 Sat:      97 % on Room air Temp:     97.7 degrees F oral Pulse rate:   65 / minute Pulse rhythm:   regular BP sitting:   130 / 78  (left arm) Cuff size:   regular  Vitals Entered By: Kern Reap CMA Duncan Dull) (May 27, 2010 1:11 PM)  O2 Flow:  Room air  CC: follow-up visit----concern about med and being cold    History of Present Illness: this 70 year old white divorced female is in today complaining of being cold all the time. She has been anemic in the past also relates had hypokalemia in October but this was corrected. 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 Plan to check lab studies Patient has had no seizures in the past year Blood pressure under good control 130/78  Allergies: 1)  ! Neosporin Original (Neomycin-Bacitracin-Polymyxin) 2)  ! Codeine 3)  ! Pcn 4)  ! Adhesive Tape  Past History:  Past Medical History: Last updated: 03/25/2010 Current Problems:  CAD (ICD-414.00) CHEST PAIN (ICD-786.50) HYPERLIPIDEMIA (ICD-272.4) CERUMEN IMPACTION, RIGHT (ICD-380.4) CHOLELITHIASIS (ICD-574.20) IRRITABLE BOWEL SYNDROME (ICD-564.1) ABDOMINAL PAIN (ICD-789.00) GERD (ICD-530.81) TINNITUS, RIGHT (ICD-388.30) SCREENING FOR MALIGNANT NEOPLASM OF THE CERVIX (ICD-V76.2) SEIZURE DISORDER (ICD-780.39) HYPOTHYROIDISM (ICD-244.9) ANEMIA (ICD-285.9) UNS ADVRS EFF OTH RX MEDICINAL&BIOLOGICAL SBSTNC (UYQ-034.74) UTI (ICD-599.0) ALLERGIC RHINITIS (ICD-477.9) EDEMA- LOCALIZED (ICD-782.3) MUSCLE SPASM (ICD-728.85) DEPRESSION, ACUTE (ICD-296.23) ARTHRITIS, BACK (ICD-721.90) EPILEPSY, 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  if cultured or empiric      -[place], [place]. Atrial Fibrillation    -Nonsustained.  Recurrent 10/22- 03/23/10  Past Surgical History: Last updated: 01/21/2009 Hysterectomy  1985 L wrist fx with plates  2595  Family History: Last updated: 04/10/2009 M had angina F had MI, CHF  Review of Systems      See HPI General:  Denies chills, fatigue, fever, loss of appetite, malaise, sleep disorder, sweats, weakness, and weight loss. Eyes:  Denies blurring, discharge, double vision, eye irritation, eye pain, halos, itching, light sensitivity, red eye, vision loss-1 eye, and vision loss-both eyes. CV:  See HPI; CAD. Resp:  Complains of chest discomfort and shortness of breath; Qvar.  Physical Exam  General:  Well-developed,well-nourished,in no acute distress; alert,appropriate and cooperative throughout examination Head:  Normocephalic and atraumatic without obvious abnormalities. No apparent alopecia or balding. Eyes:  No corneal or conjunctival inflammation noted. EOMI. Perrla. Funduscopic exam benign, without hemorrhages, exudates or papilledema. Vision grossly normal. Ears:  External ear exam shows no significant lesions or deformities.  Otoscopic examination reveals clear canals, tympanic membranes are intact bilaterally without bulging, retraction, inflammation or discharge. Hearing is grossly normal bilaterally. Nose:  External nasal examination shows no deformity or inflammation. Nasal mucosa are pink and moist without lesions or exudates. Mouth:  Oral mucosa and oropharynx without lesions or exudates.  Teeth in good repair. Neck:  No deformities, masses, or tenderness noted. Chest Wall:  No deformities, masses, or tenderness noted. Lungs:  Normal respiratory effort, chest expands symmetrically. Lungs are clear to auscultation, no crackles or wheezes. Heart:  Normal rate and regular rhythm. S1 and S2 normal without gallop, murmur, click, rub or other extra  sounds. Abdomen:  Bowel  sounds positive,abdomen soft and non-tender without masses, organomegaly or hernias noted. Msk:  tender over the lumbar spine as well as sacroiliac joints bilaterally no limitation straight leg raising   Impression & Recommendations:  Problem # 1:  ANEMIA (ICD-285.9) Assessment Deteriorated  Orders: TLB-CBC Platelet - w/Differential (85025-CBCD) Prescription Created Electronically [phone])  Her updated medication list for this problem includes:    Ferrous Fumarate 325 Mg Tabs (Ferrous fumarate) .Marland Kitchen... 2  by mouth daily  Problem # 2:  ATRIAL FIBRILLATION (ICD-427.31) Assessment: Improved  Her updated medication list for this problem includes:    Metoprolol Succinate 50 Mg Xr24h-tab (Metoprolol succinate) .Marland Kitchen... Take 1 tablet  once daily    Aspirin 325 Mg Tabs (Aspirin) .Marland Kitchen... Take 1 tablet by mouth once a day    Dilt-cd 180 Mg Xr24h-cap (Diltiazem hcl coated beads) .Marland Kitchen... Take one capsule daily  Orders: Prescription Created Electronically [phone])  Problem # 3:  TREMOR (ICD-781.0) Assessment: Unchanged  Orders: Prescription Created Electronically [phone])  Problem # 4:  CAD (ICD-414.00) Assessment: Improved  Her updated medication list for this problem includes:    Nitroglycerin 0.4 Mg Subl (Nitroglycerin) ..... One tablet under tongue every 5 minutes as needed for chest pain---may repeat times three    Metoprolol Succinate 50 Mg Xr24h-tab (Metoprolol succinate) .Marland Kitchen... Take 1 tablet  once daily    Aspirin 325 Mg Tabs (Aspirin) .Marland Kitchen... Take 1 tablet by mouth once a day    Dilt-cd 180 Mg Xr24h-cap (Diltiazem hcl coated beads) .Marland Kitchen... Take one capsule daily  Orders: TLB-BMP (Basic Metabolic Panel-BMET) (80048-METABOL)  Problem # 5:  ARTHRITIS, BACK (ICD-721.90) Assessment: Deteriorated  diclofenac 75 mg b.i.d. p.c.  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 Take 1 tablet twice a day  Orders: Prescription Created Electronically [phone])  Complete Medication List: 1)  Boniva 150 Mg Tabs (Ibandronate sodium) .... Take 1 tab by mouth once a month 2)  Phenobarbital 60 Mg Tabs (Phenobarbital) .... Take 1 tablet twice a day 3)  Crestor 20 Mg Tabs (Rosuvastatin calcium) .... Take 1 tab by mouth at bedtime 4)  Prevacid 15 Mg Cpdr (Lansoprazole) .Marland Kitchen.. 1 capsule by mouth  every morning before meal 5)  Multivitamins Tabs (Multiple vitamin) .... Take 1 tablet by mouth once a day 6)  Calcium Carbonate-vitamin D 600-400 Mg-unit Tabs (Calcium carbonate-vitamin d) .... Take 1 tablet by mouth three times a day 7)  Tylenol Extra Strength 500 Mg Tabs (Acetaminophen) .... Per bottle 8)  Nitroglycerin 0.4 Mg Subl (Nitroglycerin) .... One tablet under tongue every 5 minutes as needed for chest pain---may repeat times three 9)  Metoprolol Succinate 50 Mg Xr24h-tab (Metoprolol succinate) .... Take 1 tablet  once daily 10)  Imodium A-d 2 Mg Tabs (Loperamide hcl) .... 2 stat then 1 after each loose stool as needed 11)  Aspirin 325 Mg Tabs (Aspirin) .... Take 1 tablet by mouth once a day 12)  Qvar 40 Mcg/act Aers (Beclomethasone dipropionate) .... Two puffs twice daily 13)  Dilt-cd 180 Mg Xr24h-cap (Diltiazem hcl coated beads) .... Take one capsule daily 14)  Flonase 50 Mcg/act Susp (Fluticasone propionate) .... 2 sprays each nostril once daily 15)  Saline Nasal Spray 0.65 % Soln (Saline) .... 3 sprays each nostril three times a day 16)  Diclofenac Sodium 75 Mg Tbec (Diclofenac sodium) .Marland KitchenMarland KitchenMarland Kitchen  1 two times a day pc for arthritis 17)  Flexeril 10 Mg Tabs (Cyclobenzaprine hcl) .Marland Kitchen.. 1 morn,  midafternoon and hs for muscle spasm 18)  Vitamin C 500 Mg Tabs (Ascorbic acid) .... Take 1 tablet by mouth once a day 19)  Hepa Filter Mask  .... Use as directed 20)  Proair Hfa 108 (90 Base) Mcg/act Aers  (Albuterol sulfate) .... 2 puffs every 4 hour as needed 21)  Ferrous Fumarate 325 Mg Tabs (Ferrous fumarate) .... 2  by mouth daily  Other Orders: Venipuncture (84696) T- * Misc. Laboratory test [phone]) Specimen Handling (41324)  Patient Instructions: 1)  we'll call results of lab 2)  Take diclofenac 75 mg twice daily for your back pain and inflammation Prescriptions: FLONASE 50 MCG/ACT SUSP (FLUTICASONE PROPIONATE) 2 sprays each nostril once daily  #16 Gram x 11   Entered by:   Alfred Levins, CMA   Authorized by:   Judithann Sheen MD   Signed by:   Alfred Levins, CMA on 06/13/2010   Method used:   Electronically to        [facility]. [phone]* (retail)       [address]       [place], [place]  [address]       Ph: [phone]       Fax: [phone]   RxID:   [id number] DICLOFENAC SODIUM 75 MG TBEC (DICLOFENAC SODIUM) 1 two times a day pc for arthritis  #60 x 11   Entered and Authorized by:   Judithann Sheen MD   Signed by:   Judithann Sheen MD on 05/27/2010   Method used:   Electronically to        [facility]. [phone]* (retail)       [address]       [place], [place]  [address]       Ph: [phone]       Fax: [phone]   RxID:   [id number]    Orders Added: 1)  Venipuncture [48546] 2)  T- * Misc. Laboratory test [99999] 3)  Specimen Handling [99000] 4)  TLB-BMP (Basic Metabolic Panel-BMET) [80048-METABOL] 5)  TLB-CBC Platelet - w/Differential [85025-CBCD] 6)  Prescription Created Electronically [id number]

## 2010-07-03 NOTE — Assessment & Plan Note (Signed)
Summary: diarrhea/pink discharge from rectum/chills/cjr   Vital Signs:  Patient profile:   70 year old female Menstrual status:  hysterectomy Weight:      176 pounds Temp:     97.5 degrees F oral Pulse rate:   108 / minute Pulse rhythm:   regular BP sitting:   114 / 68  (left arm)  Vitals Entered By: Kyung Rudd, CMA (June 13, 2010 3:19 PM) CC: pt c/o diarrhea, chills, loss of appetite and pink discharge from rectum   Primary Care Provider:  Judithann Sheen MD  CC:  pt c/o diarrhea, chills, and loss of appetite and pink discharge from rectum.  History of Present Illness: Patient presents to clinic as a workin for evaluation of diarrhea. Notes acute onset of loose watery diarrhea  ~24 hours ago. Developed chills last night with no confirmed fever. Has mild lower abdominal pain without radiation, nausea or vomitting. Diarrhea improved with otc immodium.Did see on two occasion slight pink tinge mixed in diarrhea. No gross active bleeding, hematemisis, hematochezia or melena.  Is attempting to increase by mouth fluid intake. Chart review indicates h/o anemia currently taking iron supplements. States plans to followup with PMD regarding this and followup labwork.    Current Medications (verified): 1)  Boniva 150 Mg  Tabs (Ibandronate Sodium) .... Take 1 Tab By Mouth Once A Month 2)  Phenobarbital 60 Mg  Tabs (Phenobarbital) .... Take 1 Tablet Twice A Day 3)  Crestor 20 Mg Tabs (Rosuvastatin Calcium) .... Take 1 Tab By Mouth At Bedtime 4)  Prevacid 15 Mg Cpdr (Lansoprazole) .Marland Kitchen.. 1 Capsule By Mouth  Every Morning Before Meal 5)  Multivitamins  Tabs (Multiple Vitamin) .... Take 1 Tablet By Mouth Once A Day 6)  Calcium Carbonate-Vitamin D 600-400 Mg-Unit  Tabs (Calcium Carbonate-Vitamin D) .... Take 1 Tablet By Mouth Three Times A Day 7)  Tylenol Extra Strength 500 Mg Tabs (Acetaminophen) .... Per Bottle 8)  Nitroglycerin 0.4 Mg Subl (Nitroglycerin) .... One Tablet Under  Tongue Every 5 Minutes As Needed For Chest Pain---May Repeat Times Three 9)  Metoprolol Succinate 50 Mg Xr24h-Tab (Metoprolol Succinate) .... Take 1 Tablet  Once Daily 10)  Imodium A-D 2 Mg Tabs (Loperamide Hcl) .... 2 Stat Then 1 After Each Loose Stool As Needed 11)  Aspirin 325 Mg Tabs (Aspirin) .... Take 1 Tablet By Mouth Once A Day 12)  Qvar 40 Mcg/act  Aers (Beclomethasone Dipropionate) .... Two Puffs Twice Daily 13)  Dilt-Cd 180 Mg Xr24h-Cap (Diltiazem Hcl Coated Beads) .... Take One Capsule Daily 14)  Flonase 50 Mcg/act Susp (Fluticasone Propionate) .... 2 Sprays Each Nostril Once Daily 15)  Saline Nasal Spray 0.65 % Soln (Saline) .... 3 Sprays Each Nostril Three Times A Day 16)  Diclofenac Sodium 75 Mg Tbec (Diclofenac Sodium) .Marland Kitchen.. 1 Two Times A Day Pc For Arthritis 17)  Flexeril 10 Mg Tabs (Cyclobenzaprine Hcl) .Marland Kitchen.. 1 Morn,  Midafternoon and Hs For Muscle Spasm 18)  Vitamin C 500 Mg Tabs (Ascorbic Acid) .... Take 1 Tablet By Mouth Once A Day 19)  Hepa Filter Mask .... Use As Directed 20)  Proair Hfa 108 (90 Base) Mcg/act Aers (Albuterol Sulfate) .... 2 Puffs Every 4 Hour As Needed 21)  Ferrous Fumarate 325 Mg Tabs (Ferrous Fumarate) .... 2  By Mouth Daily  Allergies (verified): 1)  ! Neosporin Original (Neomycin-Bacitracin-Polymyxin) 2)  ! Codeine 3)  ! Pcn 4)  ! Adhesive Tape  Past History:  Past medical, surgical, family and social  histories (including risk factors) reviewed, and no changes noted (except as noted below).  Past Medical History: Reviewed history from 03/25/2010 and no changes required. Current Problems:  CAD (ICD-414.00) CHEST PAIN (ICD-786.50) HYPERLIPIDEMIA (ICD-272.4) CERUMEN IMPACTION, RIGHT (ICD-380.4) CHOLELITHIASIS (ICD-574.20) IRRITABLE BOWEL SYNDROME (ICD-564.1) ABDOMINAL PAIN (ICD-789.00) GERD (ICD-530.81) TINNITUS, RIGHT (ICD-388.30) SCREENING FOR MALIGNANT NEOPLASM OF THE CERVIX (ICD-V76.2) SEIZURE DISORDER (ICD-780.39) HYPOTHYROIDISM  (ICD-244.9) ANEMIA (ICD-285.9) UNS ADVRS EFF OTH RX MEDICINAL&BIOLOGICAL SBSTNC (OZH-086.57) UTI (ICD-599.0) ALLERGIC RHINITIS (ICD-477.9) EDEMA- LOCALIZED (ICD-782.3) MUSCLE SPASM (ICD-728.85) DEPRESSION, ACUTE (ICD-296.23) ARTHRITIS, BACK (ICD-721.90) EPILEPSY, 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 if cultured or empiric      -[place], [place] Atrial Fibrillation    -Nonsustained.  Recurrent 10/22- 03/23/10  Past Surgical History: Reviewed history from 01/21/2009 and no changes required. Hysterectomy  1985 L wrist fx with plates  8469  Family History: Reviewed history from 04/10/2009 and no changes required. M had angina F had MI, CHF  Social History: Reviewed history from 07/31/2009 and no changes required. divorced.   No children. never smoked social alcohol still working: Producer, television/film/video at [employer]  Review of Systems General:  Complains of chills and fatigue; denies fever, loss of appetite, sweats, and weakness. GI:  Complains of abdominal pain, change in bowel habits, and diarrhea; denies bloody stools, constipation, dark tarry stools, excessive appetite, loss of appetite, nausea, vomiting, vomiting blood, and yellowish skin color.  Physical Exam  General:  Well-developed,well-nourished,in no acute distress; alert,appropriate and cooperative throughout examination Head:  Normocephalic and atraumatic without obvious abnormalities. No apparent alopecia or balding. Eyes:  pupils equal, pupils round, corneas and lenses clear, and no injection.   Ears:  no external deformities.   Nose:  no external deformity.   Abdomen:  soft, normal bowel sounds, no distention, no masses, no guarding, no rigidity, no rebound tenderness, no abdominal hernia, no hepatomegaly, and no splenomegaly.  +mild discomfort to palpation epigastric area. Skin:  turgor normal, color normal, and no rashes.   Psych:  Oriented X3.      Impression & Recommendations:  Problem # 1:  DIARRHEA, ACUTE (ICD-787.91) Assessment New Suspect viral gastroenteritis. Pink tinged stool likely result of irritation. Recommend bland diet to be advanced as tolerated. Increase by mouth fluid intake with gatorade. Imodium as needed diarrhea. Increase prevacid 30mg  once daily. Followup closely if no improvement within 36-48 hours or worsening abd pain, chills or rectal bleeding. States understanding and agreement.  Her updated medication list for this problem includes:    Imodium A-d 2 Mg Tabs (Loperamide hcl) .Marland Kitchen... 2 stat then 1 after each loose stool as needed  Complete Medication List: 1)  Boniva 150 Mg Tabs (Ibandronate sodium) .... Take 1 tab by mouth once a month 2)  Phenobarbital 60 Mg Tabs (Phenobarbital) .... Take 1 tablet twice a day 3)  Crestor 20 Mg Tabs (Rosuvastatin calcium) .... Take 1 tab by mouth at bedtime 4)  Prevacid 15 Mg Cpdr (Lansoprazole) .Marland Kitchen.. 1 capsule by mouth  every morning before meal 5)  Multivitamins Tabs (Multiple vitamin) .... Take 1 tablet by mouth once a day 6)  Calcium Carbonate-vitamin D 600-400 Mg-unit Tabs (Calcium carbonate-vitamin d) .... Take 1 tablet by mouth three times a day 7)  Tylenol Extra Strength 500 Mg Tabs (Acetaminophen) .... Per bottle 8)  Nitroglycerin 0.4 Mg Subl (Nitroglycerin) .... One tablet under tongue every 5 minutes as needed for chest pain---may repeat times three 9)  Metoprolol Succinate 50  Mg Xr24h-tab (Metoprolol succinate) .... Take 1 tablet  once daily 10)  Imodium A-d 2 Mg Tabs (Loperamide hcl) .... 2 stat then 1 after each loose stool as needed 11)  Aspirin 325 Mg Tabs (Aspirin) .... Take 1 tablet by mouth once a day 12)  Qvar 40 Mcg/act Aers (Beclomethasone dipropionate) .... Two puffs twice daily 13)  Dilt-cd 180 Mg Xr24h-cap (Diltiazem hcl coated beads) .... Take one capsule daily 14)  Flonase 50 Mcg/act Susp (Fluticasone propionate) .... 2 sprays each nostril  once daily 15)  Saline Nasal Spray 0.65 % Soln (Saline) .... 3 sprays each nostril three times a day 16)  Diclofenac Sodium 75 Mg Tbec (Diclofenac sodium) .Marland Kitchen.. 1 two times a day pc for arthritis 17)  Flexeril 10 Mg Tabs (Cyclobenzaprine hcl) .Marland Kitchen.. 1 morn,  midafternoon and hs for muscle spasm 18)  Vitamin C 500 Mg Tabs (Ascorbic acid) .... Take 1 tablet by mouth once a day 19)  Hepa Filter Mask  .... Use as directed 20)  Proair Hfa 108 (90 Base) Mcg/act Aers (Albuterol sulfate) .... 2 puffs every 4 hour as needed 21)  Ferrous Fumarate 325 Mg Tabs (Ferrous fumarate) .... 2  by mouth daily   Orders Added: 1)  Est. Patient Level III [04540]

## 2010-07-03 NOTE — Assessment & Plan Note (Signed)
Summary: 3wk f/u sl   Visit Type:  3 WEEKS FOLLOW UP Primary Provider:  Judithann Sheen MD   History of Present Illness: She went into the hospital on [birth year], and was discharged on the 24th, and saw Tereso Newcomer in followup.  She had some atrial fibrillation with rapid response, and spontaneous conversion.   Has had some dehydration with diuretic, and BUN/Cr elevation that have been mildly elevated.  She was in the ER not too long later. Otherwise, she seems to be doing pretty well overall.  She denies any chest pain, or progressive shortness of breath.  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) 3)  Muscle Spasm  (ICD-728.85) 4)  Extrinsic Asthma, Unspecified  (ICD-493.00) 5)  Lung Nodule  (ICD-518.89) 6)  Thyroid Nodule  (ICD-241.0) 7)  Atrial Fibrillation  (ICD-427.31) 8)  Acute Maxillary Sinusitis  (ICD-461.0) 9)  Costochondritis, Left  (ICD-733.6) 10)  Tremor  (ICD-781.0) 11)  Bursitis, Acromioclavicular, Right  (ICD-726.10) 12)  Special Screening For Malignant Neoplasms Colon  (ICD-V76.51) 13)  Cad  (ICD-414.00) 14)  Chest Pain  (ICD-786.50) 15)  Hyperlipidemia  (ICD-272.4) 16)  Cholelithiasis  (ICD-574.20) 17)  Irritable Bowel Syndrome  (ICD-564.1) 18)  Gerd  (ICD-530.81) 19)  Tinnitus, Right  (ICD-388.30) 20)  Screening For Malignant Neoplasm of The Cervix  (ICD-V76.2) 21)  Seizure Disorder  (ICD-780.39) 22)  Hypothyroidism  (ICD-244.9) 23)  Anemia  (ICD-285.9) 24)  Uti  (ICD-599.0) 25)  Allergic Rhinitis  (ICD-477.9) 26)  Edema- Localized  (ICD-782.3) 27)  Muscle Spasm  (ICD-728.85) 28)  Depression, Acute  (ICD-296.23) 29)  Arthritis, Back  (ICD-721.90) 30)  Epilepsy, Grand Mal Status  (ICD-345.3) 31)  Osteopenia  (ICD-733.90)  Current Medications (verified): 1)  Boniva 150 Mg  Tabs (Ibandronate Sodium) .... Take 1 Tab By Mouth Once A Month 2)  Phenobarbital 60 Mg  Tabs (Phenobarbital)  .... Take 1 Tablet Twice A Day 3)  Crestor 20 Mg Tabs (Rosuvastatin Calcium) .... Take 1 Tab By Mouth At Bedtime 4)  Prevacid 15 Mg Cpdr (Lansoprazole) .Marland Kitchen.. 1 Capsule By Mouth  Every Morning Before Meal 5)  Multivitamins  Tabs (Multiple Vitamin) .... Take 1 Tablet By Mouth Once A Day 6)  Calcium Carbonate-Vitamin D 600-400 Mg-Unit  Tabs (Calcium Carbonate-Vitamin D) .... Take 1 Tablet By Mouth Three Times A Day 7)  Triamterene-Hctz 37.5-25 Mg Tabs (Triamterene-Hctz) .... 1/2 Tablet Once Daily 8)  Tylenol Extra Strength 500 Mg Tabs (Acetaminophen) .... Per Bottle 9)  Nitroglycerin 0.4 Mg Subl (Nitroglycerin) .... One Tablet Under Tongue Every 5 Minutes As Needed For Chest Pain---May Repeat Times Three 10)  Metoprolol Succinate 50 Mg Xr24h-Tab (Metoprolol Succinate) .... Take 1 Tablet  Once Daily 11)  Imodium A-D 2 Mg Tabs (Loperamide Hcl) .... 2 Stat Then 1 After Each Loose Stool As Needed 12)  Aspirin 325 Mg Tabs (Aspirin) .... Take 1 Tablet By Mouth Once A Day 13)  Potassium Chloride Crys Cr 20 Meq Cr-Tabs (Potassium Chloride Crys Cr) .Marland Kitchen.. 1 Tab Once Daily 14)  Qvar 40 Mcg/act  Aers (Beclomethasone Dipropionate) .... Two Puffs Twice Daily 15)  Dilt-Cd 180 Mg Xr24h-Cap (Diltiazem Hcl Coated Beads) .... Take One Capsule Daily 16)  Flonase 50 Mcg/act Susp (Fluticasone Propionate) .... 2 Sprays Each Nostril Once Daily 17)  Saline Nasal Spray 0.65 % Soln (Saline) .... 3 Sprays Each Nostril Three Times A Day 18)  Diclofenac Sodium 75 Mg Tbec (  Diclofenac Sodium) .Marland Kitchen.. 1 Two Times A Day Pc For Arthritis 19)  Flexeril 10 Mg Tabs (Cyclobenzaprine Hcl) .Marland Kitchen.. 1 Morn,  Midafternoon and Hs For Muscle Spasm 20)  Vitamin C 500 Mg Tabs (Ascorbic Acid) .... Take 1 Tablet By Mouth Once A Day 21)  Hepa Filter Mask .... Use As Directed 22)  Proair Hfa 108 (90 Base) Mcg/act Aers (Albuterol Sulfate) .... 2 Puffs Every 4 Hour As Needed  Allergies: 1)  ! Neosporin Original (Neomycin-Bacitracin-Polymyxin) 2)  !  Codeine 3)  ! Pcn 4)  ! Adhesive Tape  Past History:  Past Medical History: Last updated: 03/25/2010 Current Problems:  CAD (ICD-414.00) CHEST PAIN (ICD-786.50) HYPERLIPIDEMIA (ICD-272.4) CERUMEN IMPACTION, RIGHT (ICD-380.4) CHOLELITHIASIS (ICD-574.20) IRRITABLE BOWEL SYNDROME (ICD-564.1) ABDOMINAL PAIN (ICD-789.00) GERD (ICD-530.81) TINNITUS, RIGHT (ICD-388.30) SCREENING FOR MALIGNANT NEOPLASM OF THE CERVIX (ICD-V76.2) SEIZURE DISORDER (ICD-780.39) HYPOTHYROIDISM (ICD-244.9) ANEMIA (ICD-285.9) UNS ADVRS EFF OTH RX MEDICINAL&BIOLOGICAL SBSTNC (EAV-409.81) UTI (ICD-599.0) ALLERGIC RHINITIS (ICD-477.9) EDEMA- LOCALIZED (ICD-782.3) MUSCLE SPASM (ICD-728.85) DEPRESSION, ACUTE (ICD-296.23) ARTHRITIS, BACK (ICD-721.90) EPILEPSY, 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 if cultured or empiric      -[place], [place] Atrial Fibrillation    -Nonsustained.  Recurrent 10/22- 03/23/10  Past Surgical History: Last updated: 01/21/2009 Hysterectomy  1985 L wrist fx with plates  1914  Family History: Last updated: 04/10/2009 M had angina F had MI, CHF  Social History: Last updated: 07/31/2009 divorced.   No children. never smoked social alcohol still working: Producer, television/film/video at [employer]  Vital Signs:  Patient profile:   70 year old female Menstrual status:  hysterectomy Height:      64 inches Weight:      177.50 pounds BMI:     30.58 Pulse rate:   73 / minute Pulse rhythm:   regular Resp:     18 per minute BP sitting:   128 / 78  (left arm) Cuff size:   large  Vitals Entered By: Vikki Ports (May 05, 2010 12:47 PM)  Physical Exam  General:  Well developed, well nourished, in no acute distress. Head:  normocephalic and atraumatic Eyes:  PERRLA/EOM intact; conjunctiva and lids normal. Lungs:  Clear bilaterally to auscultation and percussion. Heart:  PMI non displaced. Normal S1 and S2.   No murmur, or gallop at present.   Abdomen:  Bowel sounds positive; abdomen soft and non-tender without masses, organomegaly, or hernias noted. No hepatosplenomegaly. Pulses:  pulses normal in all 4 extremities Extremities:  No clubbing or cyanosis. Neurologic:  Alert and oriented x 3.   EKG  Procedure date:  05/05/2010  Findings:      NSR.  IACD.  possible LAE.  Otherwise normal tracing.    Impression & Recommendations:  Problem # 1:  ATRIAL FIBRILLATION (ICD-427.31)  Reviewed in careful detail.  Has CHADS score of 1.  I also worry about compliance, as well as understanding.  We went over multiple things today in detail.  Her updated medication list for this problem includes:    Metoprolol Succinate 50 Mg Xr24h-tab (Metoprolol succinate) .Marland Kitchen... Take 1 tablet  once daily    Aspirin 325 Mg Tabs (Aspirin) .Marland Kitchen... Take 1 tablet by mouth once a day  Orders: TLB-BMP (Basic Metabolic Panel-BMET) (80048-METABOL)  Problem # 2:  EDEMA- LOCALIZED (ICD-782.3) favor stopping diuretic.  gets volume contracted easily.  Will recheck lab values today. Also hold K.    Problem # 3:  CAD (ICD-414.00) currently stable without progression of any  type of symptoms.  Her updated medication list for this problem includes:    Nitroglycerin 0.4 Mg Subl (Nitroglycerin) ..... One tablet under tongue every 5 minutes as needed for chest pain---may repeat times three    Metoprolol Succinate 50 Mg Xr24h-tab (Metoprolol succinate) .Marland Kitchen... Take 1 tablet  once daily    Aspirin 325 Mg Tabs (Aspirin) .Marland Kitchen... Take 1 tablet by mouth once a day    Dilt-cd 180 Mg Xr24h-cap (Diltiazem hcl coated beads) .Marland Kitchen... Take one capsule daily  Patient Instructions: 1)  Your physician recommends that you schedule a follow-up appointment in: 4 weeks 2)  Your physician has recommended you make the following change in your medication: Stop Triamterene/HCTZ and stop potassium

## 2010-07-03 NOTE — Progress Notes (Signed)
Summary: questions about meds before dental work  Phone Note Call from Patient Call back at [employer] [phone]   Caller: Patient Summary of Call: Question about  premeds before dental work Initial call taken by: Judie Grieve,  May 19, 2010 1:27 PM  Follow-up for Phone Call        I spoke with the pt and made her aware that she does not need SBE.  Follow-up by: Julieta Gutting, RN, BSN,  May 19, 2010 4:39 PM

## 2010-07-15 ENCOUNTER — Other Ambulatory Visit: Payer: Medicare Other

## 2010-07-15 ENCOUNTER — Encounter: Payer: Self-pay | Admitting: Physician Assistant

## 2010-07-15 ENCOUNTER — Telehealth: Payer: Self-pay | Admitting: Physician Assistant

## 2010-07-15 ENCOUNTER — Other Ambulatory Visit: Payer: Self-pay | Admitting: Physician Assistant

## 2010-07-15 ENCOUNTER — Ambulatory Visit (INDEPENDENT_AMBULATORY_CARE_PROVIDER_SITE_OTHER): Payer: Medicare Other | Admitting: Physician Assistant

## 2010-07-15 DIAGNOSIS — Z79899 Other long term (current) drug therapy: Secondary | ICD-10-CM

## 2010-07-15 DIAGNOSIS — I4891 Unspecified atrial fibrillation: Secondary | ICD-10-CM

## 2010-07-15 LAB — HEPATIC FUNCTION PANEL
ALT: 27 U/L (ref 0–35)
AST: 21 U/L (ref 0–37)
Albumin: 3.7 g/dL (ref 3.5–5.2)
Alkaline Phosphatase: 82 U/L (ref 39–117)
Total Protein: 6.6 g/dL (ref 6.0–8.3)

## 2010-07-15 LAB — BASIC METABOLIC PANEL
BUN: 34 mg/dL — ABNORMAL HIGH (ref 6–23)
CO2: 30 mEq/L (ref 19–32)
GFR: 46.11 mL/min — ABNORMAL LOW (ref >60.00)
Glucose, Bld: 89 mg/dL (ref 70–99)
Potassium: 4.3 mEq/L (ref 3.5–5.1)

## 2010-07-16 NOTE — Discharge Summary (Signed)
Misty Meyer, Misty Meyer NO.:  [id number]  MEDICAL RECORD NO.:  [id number]          PATIENT TYPE:  INP  LOCATION:  3306                         FACILITY:  [facility]  PHYSICIAN:  Hillis Range, MD       DATE OF BIRTH:  [birth year]  DATE OF ADMISSION:  06/17/2010 DATE OF DISCHARGE:  06/18/2010                              DISCHARGE SUMMARY   PRIMARY CARDIOLOGIST:  Maisie Fus D. [name], MD, F.A.C.C.  PRIMARY CARE PHYSICIAN:  Tawny Asal, M.D.  DISCHARGE DIAGNOSES: 1. Recurrent/symptomatic paroxysmal atrial fibrillation.     a.     Spontaneous conversion to normal sinus rhythm.     b.     Started on Multaq this admission.     c.     CHAD2 score of zero.  SECONDARY DIAGNOSES: 1. Nonobstructive coronary artery disease per cardiac catheterization,     August 2010.     a.     Preserved left ventricular function. 2. Dyslipidemia. 3. Irritable bowel syndrome. 4. Multinodular goiter. 5. Asthma.  PROCEDURES/DIAGNOSTICS PERFORMED DURING HOSPITALIZATION:   1. Chest x-ray demonstrating improved lingular and right middle lobe  atelectasis, but no acute findings.  ALLERGIES: 1. CODEINE causing nausea and vomiting. 2. NEOSPORIN causing rash. 3. PENICILLIN causing rash. 4. LATEX causing a rash.  REASON FOR ADMISSION:  This is a 70 year old female with the above- stated problem list who presented to the emergency department with atrial fibrillation with rapid ventricular response at a rate of 148 beats per minute.  HOSPITAL COURSE:  The patient was admitted to the Cardiology Service and placed on IV diltiazem.  This was titrated until the patient spontaneously converted to normal sinus rhythm at a rate of 15 mg per hour.  The patient was initially placed on IV heparin in case cardioversion was needed.  Obviously, this was not needed during hospitalization.  Because of the patient's multiple hospitalizations with symptomatic atrial fibrillation, an Electrophysiology  consult was obtained.  Dr. Johney Frame evaluated the patient and felt that antiarrhythmic treatment should be initiated.  With the patient's labile creatinine and decreased potassium, sotalol and Tikosyn were not to be first choice.  Given her coronary artery disease, we avoided all 1c drug class.  Therefore at this time, Multaq was recommended.  Dr. Johney Frame noted that he recognizes that with diarrhea and GI symptoms in the past, the patient may not tolerate this medication either, but it was instructed to be given with meals twice a day.  Risks, benefits, and alternatives to Multaq were discussed with the patient at length and she wishes to proceed with medication.  The patient was kept for overnight observation to watch for bradycardia, as the patient is on metoprolol, diltiazem, and Multaq at this time.  It is noted that if the patient develops bradycardia, then we may need to discontinue Cardizem at that time.  The patient was observed overnight and she tolerated the addition of Multaq.  She had no complaints.  Her telemetry remained in sinus rhythm.  The patient's EKG on the morning of discharge showed sinus rhythm at a rate of 70 beats per minute with a  QT of 440.  On day of discharge, Dr. Johney Frame evaluated the patient and discharged stable for home.  She will be continued on Multaq, aspirin, Cardizem, and metoprolol.  The patient's labs were reviewed and her liver function was within normal limits.  Medications and discharge instructions were discussed with the patient, she voiced understanding.  DISCHARGE LABORATORY:  WBC 8, hemoglobin 10.1, hematocrit 32.4, platelets 209.  Sodium 140, potassium 3.7, chloride 109, bicarb 23, BUN 20, creatinine 1.04, total bilirubin 0.2, alkaline phosphatase 76, AST 20, ALT 10, total protein 5.8, and albumin 3.0.  DISCHARGE MEDICATIONS: 1. Multaq 400 mg 1 tablet twice daily. 2. Aspirin 325 mg daily. 3. Boniva 150 mg 1 tablet monthly. 4.  Calcium/vitamin D 600/40; 100 mg three times a day. 5. Crestor 20 mg daily. 6. Diltiazem CD 180 mg daily. 7. Flonase nasal spray 2 sprays daily. 8. Imodium 2 mg 1 capsule daily as needed for diarrhea. 9. Metoprolol XL 650 mg 1 tablet daily. 10.Multivitamin daily. 11.Nitroglycerin sublingual 0.4 mg 1 tablet under tongue every 5     minutes as needed up to three doses for chest pain. 12.Phenobarbital 60 mg 1 tablet twice daily. 13.Prevacid 15 mg twice daily. 14.QVAR inhalation 40 mcg 2 puffs inhaled twice daily. 15.Tylenol Extra Strength 500 mg 2 tablets twice daily as needed for     pain.  DISCHARGE PLAN AND INSTRUCTIONS: 1. The patient will follow up with Tereso Newcomer, Dr. Rosalyn Charters PA on     July 15, 2010 at 10:00 a.m. 2. The patient is to increase activity as tolerated. 3. The patient is to continue with sodium heart-healthy diet.  Duration of discharge is greater than 30 minutes with physician and physician extender time.     Leonette Monarch, PA-C   ______________________________ Hillis Range, MD    NB/MEDQ  D:  06/18/2010  T:  06/18/2010  Job:  [id number]  cc:   Arturo Morton. [name], MD, [facility] Ellin Saba., MD  Electronically Signed by Alen Blew P.A. on 06/24/2010 04:51:42 PM Electronically Signed by Hillis Range MD on 07/16/2010 10:16:51 PM

## 2010-07-23 NOTE — Progress Notes (Signed)
Summary: pt rtn call  Phone Note Call from Patient   Caller: Patient [phone] Reason for Call: Talk to Nurse Summary of Call: pt rtn call Initial call taken by: Glynda Jaeger,  July 15, 2010 4:42 PM

## 2010-07-23 NOTE — Assessment & Plan Note (Addendum)
Summary: [facility] / per [facility]. / gd/rm   Visit Type:  Follow-up Primary Provider:  Judithann Sheen MD  CC:  no chest pain -no sob.  History of Present Illness: Primary Cardiologist:  Dr. Shawnie Pons  Misty Meyer is a 70 yo female with a h/o paroxysmal atrial fibrillation, normal LV function by echocardiogram, nausea of coronary disease by cardiac catheterization in August 2010, hyperlipidemia, multinodular goiter, seizure disorder and asthma who recently presented to [facility] on January 17 with recurrent atrial fibrillation.  She converted spontaneously to normal sinus rhythm with rate control.  It was decided to start her on Multaq.  She returns today for followup.  She is doing well.  She denies intercurrent palpitations patient denies chest pain, shortness breath, syncope.  She denies without neutropenia knee.  She does have some musculoskeletal chest pain as in general for quite some time.  This is unchanged.  She is tolerating the medications well.  She had some questions about interactions noted when she picked up her prescription.  She was concerned about phenobarbital, metoprolol and diltiazem.  I discussed the potential interactions with her today.  Current Medications (verified): 1)  Boniva 150 Mg  Tabs (Ibandronate Sodium) .... Take 1 Tab By Mouth Once A Month 2)  Phenobarbital 60 Mg  Tabs (Phenobarbital) .... Take 1 Tablet Twice A Day 3)  Crestor 20 Mg Tabs (Rosuvastatin Calcium) .... Take 1 Tab By Mouth At Bedtime 4)  Prevacid 15 Mg Cpdr (Lansoprazole) .Marland Kitchen.. 1 Capsule By Mouth  Every Morning Before Meal 5)  Multivitamins  Tabs (Multiple Vitamin) .... Take 1 Tablet By Mouth Once A Day 6)  Calcium Carbonate-Vitamin D 600-400 Mg-Unit  Tabs (Calcium Carbonate-Vitamin D) .... Take 1 Tablet By Mouth Three Times A Day 7)  Tylenol Extra Strength 500 Mg Tabs (Acetaminophen) .... Per Bottle 8)  Nitroglycerin 0.4 Mg Subl (Nitroglycerin) .... One  Tablet Under Tongue Every 5 Minutes As Needed For Chest Pain---May Repeat Times Three 9)  Metoprolol Succinate 50 Mg Xr24h-Tab (Metoprolol Succinate) .... Take 1 Tablet  Once Daily 10)  Imodium A-D 2 Mg Tabs (Loperamide Hcl) .... 2 Stat Then 1 After Each Loose Stool As Needed 11)  Aspirin 325 Mg Tabs (Aspirin) .... Take 1 Tablet By Mouth Once A Day 12)  Qvar 40 Mcg/act  Aers (Beclomethasone Dipropionate) .... Two Puffs Twice Daily 13)  Dilt-Cd 180 Mg Xr24h-Cap (Diltiazem Hcl Coated Beads) .... Take One Capsule Daily 14)  Flonase 50 Mcg/act Susp (Fluticasone Propionate) .... 2 Sprays Each Nostril Once Daily 15)  Saline Nasal Spray 0.65 % Soln (Saline) .... 3 Sprays Each Nostril Three Times A Day 16)  Diclofenac Sodium 75 Mg Tbec (Diclofenac Sodium) .Marland Kitchen.. 1 Two Times A Day Pc For Arthritis 17)  Flexeril 10 Mg Tabs (Cyclobenzaprine Hcl) .Marland Kitchen.. 1 Morn,  Midafternoon and Hs For Muscle Spasm 18)  Vitamin C 500 Mg Tabs (Ascorbic Acid) .... Take 1 Tablet By Mouth Once A Day 19)  Hepa Filter Mask .... Use As Directed 20)  Proair Hfa 108 (90 Base) Mcg/act Aers (Albuterol Sulfate) .... 2 Puffs Every 4 Hour As Needed 21)  Ferrous Fumarate 325 Mg Tabs (Ferrous Fumarate) .... 2  By Mouth Daily 22)  Multaq 400 Mg Tabs (Dronedarone Hcl) .Marland Kitchen.. 1 Tab Two Times A Day  Allergies (verified): 1)  ! Neosporin Original (Neomycin-Bacitracin-Polymyxin) 2)  ! Codeine 3)  ! Pcn 4)  ! Adhesive Tape  Past History:  Past Medical History:  Current Problems:  CAD (ICD-414.00) CHEST PAIN (ICD-786.50) HYPERLIPIDEMIA (ICD-272.4) CERUMEN IMPACTION, RIGHT (ICD-380.4) CHOLELITHIASIS (ICD-574.20) IRRITABLE BOWEL SYNDROME (ICD-564.1) ABDOMINAL PAIN (ICD-789.00) GERD (ICD-530.81) TINNITUS, RIGHT (ICD-388.30) SCREENING FOR MALIGNANT NEOPLASM OF THE CERVIX (ICD-V76.2) SEIZURE DISORDER (ICD-780.39) HYPOTHYROIDISM (ICD-244.9) ANEMIA (ICD-285.9) UNS ADVRS EFF OTH RX MEDICINAL&BIOLOGICAL SBSTNC (ZOX-096.04) UTI  (ICD-599.0) ALLERGIC RHINITIS (ICD-477.9) EDEMA- LOCALIZED (ICD-782.3) MUSCLE SPASM (ICD-728.85) DEPRESSION, ACUTE (ICD-296.23) ARTHRITIS, BACK (ICD-721.90) EPILEPSY, 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 if cultured or empiric      -[place] Atrial Fibrillation    -Nonsustained.  Recurrent 10/22- 03/23/10    -recurrent 06/2010 . Marland Kitchen Karie Soda started  Review of Systems       As per  the HPI.  All other systems reviewed and negative.   Vital Signs:  Patient profile:   70 year old female Menstrual status:  hysterectomy Height:      64 inches Weight:      178 pounds BP sitting:   128 / 70  (left arm)  Vitals Entered By: Burnett Kanaris, CNA (July 15, 2010 10:21 AM)  Physical Exam  General:  Well nourished, well developed, in no acute distress HEENT: normal Neck: no JVD Cardiac:  normal S1, S2; RRR; no murmur Lungs:  clear to auscultation bilaterally, no wheezing, rhonchi or rales Abd: soft, nontender, no hepatomegaly Ext: no pitting edema Skin: warm and dry Neuro:  CNs 2-12 intact, no focal abnormalities noted    Problems:  Medical Problems Added: 1)  Dx of Long-term (CURRENT) Use of Other Medications  (ICD-V58.69)  EKG  Procedure date:  07/15/2010  Findings:      normal sinus rhythm Heart rate 70 Normal axis Nonspecific ST-T wave changes QTC 427 ms  Impression & Recommendations:  Problem # 1:  ATRIAL FIBRILLATION (ICD-427.31) She is now on dronedarone due to recurrent paroxysmal atrial fibrillation.  She seems to be tolerating this well.  Her heart rate is stable with concomitant use with metoprolol succinate and diltiazem CD.  Her QTC is okay.  I discussed with her the potential interactions with her medications.  I answered all her questions.  She will have a basic metabolic panel checked today as well as LFTs.  Her CHADS-2 score is zero, therefore she has not been put on coumadin.  She  remains on aspirin.  She does have a CHADS-VASc score of 3 with female gender, nonobs. CAD and age over 74.  In this case, coumadin could be considered.  However, I will defer decisions regarding long term anticoagulation to her primary cardiologist, Dr. [name].   She will follow up with Dr. [name] in 4-6 weeks.  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  However, she is not symptomatically having recurrent AFib and she is maintaining NSR today on ECG.  Other Orders: EKG w/ Interpretation (93000) TLB-BMP (Basic Metabolic Panel-BMET) (80048-METABOL) TLB-Hepatic/Liver Function Pnl (80076-HEPATIC)  Patient Instructions: 1)  Your physician recommends that you schedule a follow-up appointment in:  08/26/10 @ 9:45 to see Dr. [name] as per Tereso Newcomer, PA-C.  2)  Your physician recommends that you return for lab work in: Today BMET 427.31, V58.69, LFT V58.69. 3)  Your physician recommends that you continue on your current medications as directed. Please refer to the Current Medication list given to you today.

## 2010-08-11 ENCOUNTER — Encounter: Payer: Self-pay | Admitting: Cardiology

## 2010-08-12 LAB — BASIC METABOLIC PANEL
BUN: 36 mg/dL — ABNORMAL HIGH (ref 6–23)
GFR calc Af Amer: 39 mL/min — ABNORMAL LOW (ref >60)
GFR calc non Af Amer: 33 mL/min — ABNORMAL LOW (ref >60)
Potassium: 3.2 mEq/L — ABNORMAL LOW (ref 3.5–5.1)

## 2010-08-12 LAB — MAGNESIUM: Magnesium: 2.4 mg/dL (ref 1.5–2.5)

## 2010-08-13 LAB — CBC
HCT: 37.9 % (ref 36.0–46.0)
Hemoglobin: 10.5 g/dL — ABNORMAL LOW (ref 12.0–15.0)
MCH: 31.8 pg (ref 26.0–34.0)
MCHC: 32.3 g/dL (ref 30.0–36.0)
Platelets: 187 10*3/uL (ref 150–400)
RBC: 4.03 MIL/uL (ref 3.87–5.11)
RDW: 12.4 % (ref 11.5–15.5)
WBC: 16.3 10*3/uL — ABNORMAL HIGH (ref 4.0–10.5)

## 2010-08-13 LAB — CARDIAC PANEL(CRET KIN+CKTOT+MB+TROPI)
CK, MB: 1.6 ng/mL (ref 0.3–4.0)
Relative Index: INVALID (ref 0.0–2.5)
Total CK: 104 U/L (ref 7–177)
Total CK: 92 U/L (ref 7–177)
Troponin I: 0.01 ng/mL (ref 0.00–0.06)

## 2010-08-13 LAB — BASIC METABOLIC PANEL
BUN: 34 mg/dL — ABNORMAL HIGH (ref 6–23)
CO2: 30 mEq/L (ref 19–32)
Calcium: 8.2 mg/dL — ABNORMAL LOW (ref 8.4–10.5)
Creatinine, Ser: 1.53 mg/dL — ABNORMAL HIGH (ref 0.4–1.2)
GFR calc Af Amer: 41 mL/min — ABNORMAL LOW (ref >60)
GFR calc non Af Amer: 34 mL/min — ABNORMAL LOW (ref >60)
Glucose, Bld: 97 mg/dL (ref 70–99)
Potassium: 4 mEq/L (ref 3.5–5.1)
Sodium: 139 mEq/L (ref 135–145)

## 2010-08-13 LAB — T4, FREE: Free T4: 1.32 ng/dL (ref 0.80–1.80)

## 2010-08-13 LAB — DIFFERENTIAL
Eosinophils Relative: 0 % (ref 0–5)
Lymphocytes Relative: 9 % — ABNORMAL LOW (ref 12–46)
Lymphs Abs: 1.5 10*3/uL (ref 0.7–4.0)
Monocytes Absolute: 1.2 10*3/uL — ABNORMAL HIGH (ref 0.1–1.0)
Neutrophils Relative %: 83 % — ABNORMAL HIGH (ref 43–77)

## 2010-08-13 LAB — URINALYSIS, ROUTINE W REFLEX MICROSCOPIC
Bilirubin Urine: NEGATIVE
Glucose, UA: NEGATIVE mg/dL
Specific Gravity, Urine: 1.007 (ref 1.005–1.030)
pH: 6 (ref 5.0–8.0)

## 2010-08-13 LAB — URINE MICROSCOPIC-ADD ON

## 2010-08-13 LAB — CK TOTAL AND CKMB (NOT AT ARMC)
CK, MB: 1.6 ng/mL (ref 0.3–4.0)
Relative Index: 1.4 (ref 0.0–2.5)
Total CK: 113 U/L (ref 7–177)

## 2010-08-13 LAB — URINE CULTURE: Culture  Setup Time: 201110221430

## 2010-08-14 ENCOUNTER — Ambulatory Visit (INDEPENDENT_AMBULATORY_CARE_PROVIDER_SITE_OTHER): Payer: Medicare Other | Admitting: Family Medicine

## 2010-08-14 ENCOUNTER — Encounter: Payer: Self-pay | Admitting: Family Medicine

## 2010-08-14 DIAGNOSIS — R6889 Other general symptoms and signs: Secondary | ICD-10-CM

## 2010-08-14 DIAGNOSIS — J3489 Other specified disorders of nose and nasal sinuses: Secondary | ICD-10-CM

## 2010-08-14 NOTE — Patient Instructions (Signed)
Try over the counter antihistamine such as Allegra or Zyrtec. Follow up promptly for any fever or worsening symptoms.

## 2010-08-14 NOTE — Progress Notes (Signed)
  Subjective:    Patient ID: Misty Meyer, female    DOB: September 09, 1940, 70 y.o.   MRN: [id number]  HPI  patient seen with one-day history of clear watery eyes and some sneezing. Clear mucus. No fever. Mild sore throat. Denies any body aches. No nausea, vomiting, or diarrhea. Patient reportedly has history of asthma. Takes steroid inhaler and Flonase regularly. She is very concerned because of prior history of pneumonia. Nonsmoker.   Review of Systems  Constitutional: Negative for fever.  HENT: Positive for rhinorrhea, sneezing and postnasal drip. Negative for ear pain, congestion and sore throat.   Eyes: Negative for pain and visual disturbance.  Respiratory: Negative for cough and shortness of breath.   Cardiovascular: Negative for chest pain.       Objective:   Physical Exam  patient is alert nontoxic in appearance. Afebrile Oropharynx is moist and clear Eardrums no acute change  nasal exam clear rhinorrhea  Neck supple no mass Chest clear to auscultation Heart regular rhythm and rate       Assessment & Plan:   probable allergic rhinitis versus early viral URI. Try over-the-counter Allegra one daily and followup promptly for any fever or worsening symptoms

## 2010-08-15 ENCOUNTER — Ambulatory Visit: Payer: Medicare Other | Admitting: Cardiology

## 2010-08-20 LAB — BASIC METABOLIC PANEL
BUN: 31 mg/dL — ABNORMAL HIGH (ref 6–23)
CO2: 22 mEq/L (ref 19–32)
CO2: 27 mEq/L (ref 19–32)
Calcium: 8.2 mg/dL — ABNORMAL LOW (ref 8.4–10.5)
Calcium: 9 mg/dL (ref 8.4–10.5)
Calcium: 9.3 mg/dL (ref 8.4–10.5)
Creatinine, Ser: 1.13 mg/dL (ref 0.4–1.2)
Creatinine, Ser: 1.43 mg/dL — ABNORMAL HIGH (ref 0.4–1.2)
Creatinine, Ser: 1.49 mg/dL — ABNORMAL HIGH (ref 0.4–1.2)
GFR calc non Af Amer: 37 mL/min — ABNORMAL LOW (ref >60)
Glucose, Bld: 100 mg/dL — ABNORMAL HIGH (ref 70–99)
Glucose, Bld: 144 mg/dL — ABNORMAL HIGH (ref 70–99)
Glucose, Bld: 97 mg/dL (ref 70–99)
Sodium: 137 mEq/L (ref 135–145)

## 2010-08-20 LAB — POCT CARDIAC MARKERS: Myoglobin, poc: 84.3 ng/mL (ref 12–200)

## 2010-08-20 LAB — URINE CULTURE
Colony Count: NO GROWTH
Culture: NO GROWTH
Special Requests: NEGATIVE

## 2010-08-20 LAB — CBC
HCT: 32.8 % — ABNORMAL LOW (ref 36.0–46.0)
HCT: 34.2 % — ABNORMAL LOW (ref 36.0–46.0)
Hemoglobin: 11.4 g/dL — ABNORMAL LOW (ref 12.0–15.0)
MCHC: 34.1 g/dL (ref 30.0–36.0)
MCHC: 34.5 g/dL (ref 30.0–36.0)
MCHC: 34.8 g/dL (ref 30.0–36.0)
MCHC: 35.3 g/dL (ref 30.0–36.0)
MCV: 92.7 fL (ref 78.0–100.0)
MCV: 93.4 fL (ref 78.0–100.0)
Platelets: 147 10*3/uL — ABNORMAL LOW (ref 150–400)
Platelets: 156 10*3/uL (ref 150–400)
Platelets: 172 10*3/uL (ref 150–400)
RBC: 3.72 MIL/uL — ABNORMAL LOW (ref 3.87–5.11)
RDW: 12.8 % (ref 11.5–15.5)
RDW: 13 % (ref 11.5–15.5)
RDW: 13.2 % (ref 11.5–15.5)
RDW: 13.4 % (ref 11.5–15.5)
WBC: 5.7 10*3/uL (ref 4.0–10.5)
WBC: 8.9 10*3/uL (ref 4.0–10.5)
WBC: 9.2 10*3/uL (ref 4.0–10.5)

## 2010-08-20 LAB — POCT I-STAT, CHEM 8
BUN: 18 mg/dL (ref 6–23)
HCT: 37 % (ref 36.0–46.0)
Hemoglobin: 12.6 g/dL (ref 12.0–15.0)
Sodium: 139 mEq/L (ref 135–145)
TCO2: 27 mmol/L (ref 0–100)

## 2010-08-20 LAB — LEGIONELLA ANTIGEN, URINE: Legionella Antigen, Urine: NEGATIVE

## 2010-08-20 LAB — URINE MICROSCOPIC-ADD ON

## 2010-08-20 LAB — URINALYSIS, DIPSTICK ONLY
Bilirubin Urine: NEGATIVE
Glucose, UA: NEGATIVE mg/dL
Specific Gravity, Urine: 1.012 (ref 1.005–1.030)
Urobilinogen, UA: 0.2 mg/dL (ref 0.0–1.0)

## 2010-08-20 LAB — URINALYSIS, ROUTINE W REFLEX MICROSCOPIC
Ketones, ur: NEGATIVE mg/dL
Protein, ur: NEGATIVE mg/dL
Urobilinogen, UA: 0.2 mg/dL (ref 0.0–1.0)

## 2010-08-20 LAB — CULTURE, BLOOD (ROUTINE X 2): Culture: NO GROWTH

## 2010-08-20 LAB — HEPARIN LEVEL (UNFRACTIONATED)
Heparin Unfractionated: 0.52 IU/mL (ref 0.30–0.70)
Heparin Unfractionated: 0.68 IU/mL (ref 0.30–0.70)

## 2010-08-20 LAB — EXPECTORATED SPUTUM ASSESSMENT W GRAM STAIN, RFLX TO RESP C

## 2010-08-20 LAB — CLOSTRIDIUM DIFFICILE EIA: C difficile Toxins A+B, EIA: NEGATIVE

## 2010-08-20 LAB — TSH: TSH: 0.457 u[IU]/mL (ref 0.350–4.500)

## 2010-08-26 ENCOUNTER — Ambulatory Visit: Payer: Medicare Other | Admitting: Cardiology

## 2010-08-26 ENCOUNTER — Encounter: Payer: Self-pay | Admitting: Cardiology

## 2010-08-26 ENCOUNTER — Ambulatory Visit (INDEPENDENT_AMBULATORY_CARE_PROVIDER_SITE_OTHER): Payer: Medicare Other | Admitting: Cardiology

## 2010-08-26 VITALS — BP 129/73 | HR 84 | Resp 18 | Ht 65.0 in | Wt 173.8 lb

## 2010-08-26 DIAGNOSIS — I4891 Unspecified atrial fibrillation: Secondary | ICD-10-CM

## 2010-08-26 DIAGNOSIS — R079 Chest pain, unspecified: Secondary | ICD-10-CM

## 2010-08-26 LAB — BASIC METABOLIC PANEL
BUN: 35 mg/dL — ABNORMAL HIGH (ref 6–23)
Calcium: 9.4 mg/dL (ref 8.4–10.5)
Creatinine, Ser: 1.3 mg/dL — ABNORMAL HIGH (ref 0.4–1.2)

## 2010-08-26 MED ORDER — DRONEDARONE HCL 400 MG PO TABS: 400.0000 mg | ORAL_TABLET | Freq: Two times a day (BID) | ORAL | Status: DC

## 2010-08-26 NOTE — Progress Notes (Signed)
HPI:  Overall she is doing well.  She has not noticed a recurrence of her arrythmia.  No chest pain at present.  Takes medications as prescribed.  I reviewed her ECG with Dr. Graciela Husbands.  Current Outpatient Prescriptions  Medication Sig Dispense Refill  . acetaminophen (TYLENOL) 500 MG tablet Take 500 mg by mouth every 6 (six) hours as needed.        Marland Kitchen aspirin 325 MG tablet Take 325 mg by mouth daily.        . beclomethasone (QVAR) 40 MCG/ACT inhaler Inhale 2 puffs into the lungs 2 (two) times daily.        . calcium carbonate (OS-CAL) 600 MG TABS Take 600 mg by mouth 3 (three) times daily with meals.        . cyclobenzaprine (FLEXERIL) 10 MG tablet Take 10 mg by mouth 3 (three) times daily as needed.        . diclofenac (VOLTAREN) 75 MG EC tablet Take 75 mg by mouth 2 (two) times daily.        Marland Kitchen dronedarone (MULTAQ) 400 MG tablet Take 1 tablet (400 mg total) by mouth 2 (two) times daily with a meal.  60 tablet  11  . ferrous fumarate (HEMOCYTE - 106 MG FE) 325 (106 FE) MG TABS Take 2 tablets by mouth daily.        . fluticasone (FLONASE) 50 MCG/ACT nasal spray 2 sprays by Nasal route daily.        Marland Kitchen ibandronate (BONIVA) 150 MG tablet Take 150 mg by mouth every 30 (thirty) days. Take in the morning with a full glass of water, on an empty stomach, and do not take anything else by mouth or lie down for the next 30 min.       . lansoprazole (PREVACID) 15 MG capsule Take 15 mg by mouth daily.        Marland Kitchen loperamide (IMODIUM A-D) 2 MG tablet Take 2 mg by mouth as needed.        . metoprolol (TOPROL-XL) 50 MG 24 hr tablet Take 50 mg by mouth daily.        . Multiple Vitamin (MULTIVITAMIN) tablet Take 1 tablet by mouth daily.        . nitroGLYCERIN (NITROSTAT) 0.4 MG SL tablet Place 0.4 mg under the tongue every 5 (five) minutes as needed.        Marland Kitchen PHENObarbital (LUMINAL) 60 MG tablet Take 60 mg by mouth 2 (two) times daily.        Marland Kitchen DISCONTD: diltiazem (CARDIZEM CD) 180 MG 24 hr capsule Take 180 mg by mouth  daily.        Marland Kitchen DISCONTD: diltiazem (DILACOR XR) 180 MG 24 hr capsule Place 1 capsule inside cheek Daily.      Marland Kitchen DISCONTD: dronedarone (MULTAQ) 400 MG tablet Take 400 mg by mouth 2 (two) times daily with a meal.        . albuterol (PROAIR HFA) 108 (90 BASE) MCG/ACT inhaler Inhale 2 puffs into the lungs every 4 (four) hours as needed.        . Ascorbic Acid (VITAMIN C) 500 MG tablet Take 500 mg by mouth daily.        . rosuvastatin (CRESTOR) 20 MG tablet Take 20 mg by mouth daily.        . sodium chloride (OCEAN) 0.65 % nasal spray 1 spray by Nasal route as needed.          Allergies  Allergen  Reactions  . Codeine   . Penicillins   . Triple Antibiotic     REACTION: rash  . Latex Rash    Past Medical History  Diagnosis Date  . CAD (coronary artery disease)   . Chest pain, unspecified   . HLD (hyperlipidemia)   . Impacted cerumen   . Calculus of gallbladder without mention of cholecystitis or obstruction   . IBS (irritable bowel syndrome)   . Abdominal pain, unspecified site   . GERD (gastroesophageal reflux disease)   . Tinnitus   . Seizure disorder   . Unspecified hypothyroidism   . Anemia   . Unspecified adverse effect of other drug, medicinal and biological substance   . Urinary tract infection, site not specified   . Allergic rhinitis, cause unspecified   . Edema   . Spasm of muscle   . Major depressive disorder, single episode, severe, without mention of psychotic behavior   . Arthritis of back   . 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 Osteopenia   . Atrial fibrillation     Past Surgical History  Procedure Date  . Total abdominal hysterectomy   . Wrist fracture surgery     with plates (left)    Family History  Problem Relation Age of Onset  . Angina Mother   . Heart attack Father   . Heart failure Father     History   Social History  . Marital Status: Divorced    Spouse Name: N/A    Number of Children: N/A  . Years of Education: N/A   Occupational History   . hair dresser    Social History Main Topics  . Smoking status: Never Smoker   . Smokeless tobacco: Not on file  . Alcohol Use: Yes  . Drug Use: Not on file  . Sexually Active: Not on file   Other Topics Concern  . Not on file   Social History Narrative  . No narrative on file    ROS: Please see the HPI.  All other systems reviewed and negative.  PHYSICAL EXAM:  BP 129/73  Pulse 84  Resp 18  Ht 5\' 5"  (1.651 m)  Wt 173 lb 12.8 oz (78.835 kg)  BMI 28.92 kg/m2  General: Well developed, well nourished, in no acute distress. Head:  Normocephalic and atraumatic. Neck: no JVD Lungs: Clear to auscultation and percussion. Heart: Normal S1 and S2.  No murmur, rubs or gallops.  Abdomen:  Normal bowel sounds; soft; non tender; no organomegaly Pulses: Pulses normal in all 4 extremities. Extremities: No clubbing or cyanosis. No edema. Neurologic: Alert and oriented x 3.  EKG:NSR.  Nonspecific T abnormality.  ASSESSMENT AND PLAN:

## 2010-08-26 NOTE — Assessment & Plan Note (Signed)
None recurrent at this time.

## 2010-08-26 NOTE — Assessment & Plan Note (Signed)
She currently has had no recurrence.  She remains in NSR.  QTc is prolonged, but .  Reviewed with Dr. Graciela Husbands.  Will stop diltiazem.  Potential for reduced levels of droneradone with phenobarb noted.  Clinically successful.

## 2010-08-26 NOTE — Patient Instructions (Signed)
Your physician recommends that you schedule a follow-up appointment in: 3 MONTHS  Your physician has recommended you make the following change in your medication: stop Diltiazem  Your physician recommends that you return for lab work today: BMP

## 2010-09-01 ENCOUNTER — Telehealth: Payer: Self-pay | Admitting: Cardiology

## 2010-09-01 ENCOUNTER — Telehealth: Payer: Self-pay | Admitting: Critical Care Medicine

## 2010-09-01 NOTE — Telephone Encounter (Signed)
lmomtcb x1. Pt last seen 05/06/10 and told to f/u in 4 months

## 2010-09-01 NOTE — Telephone Encounter (Signed)
Pt calling re blood work report

## 2010-09-01 NOTE — Telephone Encounter (Signed)
Pt aware of BMP results by phone.  

## 2010-09-02 NOTE — Telephone Encounter (Signed)
Pt. Returned call from triage. I have scheduled an rov w/ pw for 09/22/10. Tivis Ringer

## 2010-09-07 LAB — BASIC METABOLIC PANEL
BUN: 16 mg/dL (ref 6–23)
CO2: 28 mEq/L (ref 19–32)
Chloride: 104 mEq/L (ref 96–112)
GFR calc non Af Amer: 43 mL/min — ABNORMAL LOW (ref >60)
Glucose, Bld: 103 mg/dL — ABNORMAL HIGH (ref 70–99)
Potassium: 4.1 mEq/L (ref 3.5–5.1)
Sodium: 138 mEq/L (ref 135–145)

## 2010-09-07 LAB — COMPREHENSIVE METABOLIC PANEL
AST: 26 U/L (ref 0–37)
BUN: 19 mg/dL (ref 6–23)
CO2: 31 mEq/L (ref 19–32)
Calcium: 9.7 mg/dL (ref 8.4–10.5)
Chloride: 102 mEq/L (ref 96–112)
Creatinine, Ser: 1.29 mg/dL — ABNORMAL HIGH (ref 0.4–1.2)
GFR calc Af Amer: 50 mL/min — ABNORMAL LOW (ref >60)
GFR calc non Af Amer: 41 mL/min — ABNORMAL LOW (ref >60)
Total Bilirubin: 0.4 mg/dL (ref 0.3–1.2)

## 2010-09-07 LAB — CBC
HCT: 33.2 % — ABNORMAL LOW (ref 36.0–46.0)
HCT: 36 % (ref 36.0–46.0)
Hemoglobin: 11.3 g/dL — ABNORMAL LOW (ref 12.0–15.0)
MCHC: 34.1 g/dL (ref 30.0–36.0)
MCHC: 34.4 g/dL (ref 30.0–36.0)
MCV: 92.5 fL (ref 78.0–100.0)
MCV: 94.4 fL (ref 78.0–100.0)
Platelets: 189 10*3/uL (ref 150–400)
Platelets: 210 10*3/uL (ref 150–400)
RBC: 3.89 MIL/uL (ref 3.87–5.11)
RDW: 13.1 % (ref 11.5–15.5)
WBC: 8.2 10*3/uL (ref 4.0–10.5)

## 2010-09-07 LAB — CARDIAC PANEL(CRET KIN+CKTOT+MB+TROPI)
Relative Index: INVALID (ref 0.0–2.5)
Troponin I: 0.01 ng/mL (ref 0.00–0.06)
Troponin I: 0.02 ng/mL (ref 0.00–0.06)

## 2010-09-07 LAB — APTT: aPTT: 28 seconds (ref 24–37)

## 2010-09-07 LAB — PROTIME-INR: INR: 1 (ref 0.00–1.49)

## 2010-09-18 ENCOUNTER — Encounter: Payer: Self-pay | Admitting: *Deleted

## 2010-09-22 ENCOUNTER — Encounter: Payer: Self-pay | Admitting: Critical Care Medicine

## 2010-09-22 ENCOUNTER — Other Ambulatory Visit (INDEPENDENT_AMBULATORY_CARE_PROVIDER_SITE_OTHER): Payer: Medicare Other | Admitting: *Deleted

## 2010-09-22 ENCOUNTER — Ambulatory Visit (INDEPENDENT_AMBULATORY_CARE_PROVIDER_SITE_OTHER): Payer: Medicare Other | Admitting: Critical Care Medicine

## 2010-09-22 VITALS — BP 126/72 | HR 82 | Temp 97.7°F | Ht 65.0 in | Wt 178.2 lb

## 2010-09-22 DIAGNOSIS — R079 Chest pain, unspecified: Secondary | ICD-10-CM

## 2010-09-22 DIAGNOSIS — J45909 Unspecified asthma, uncomplicated: Secondary | ICD-10-CM

## 2010-09-22 DIAGNOSIS — I4891 Unspecified atrial fibrillation: Secondary | ICD-10-CM

## 2010-09-22 LAB — BASIC METABOLIC PANEL
BUN: 30 mg/dL — ABNORMAL HIGH (ref 6–23)
Calcium: 8.9 mg/dL (ref 8.4–10.5)
Chloride: 104 mEq/L (ref 96–112)
Creatinine, Ser: 1.4 mg/dL — ABNORMAL HIGH (ref 0.4–1.2)

## 2010-09-22 NOTE — Assessment & Plan Note (Signed)
Stable moderate persistent asthma Plan No change in inhaled or maintenance medications. Return in  4 months 

## 2010-09-22 NOTE — Patient Instructions (Signed)
No change in medications. Return in         4 months 

## 2010-09-22 NOTE — Progress Notes (Signed)
Subjective:    Patient ID: Misty Meyer, female    DOB: 1941/05/25, 70 y.o.   MRN: [id number]  HPI 70 y.o. female with known hx of asthmatic bronchitis, abn CXR with LLL scar, ? MAC ? TB in past.   09/22/2010 Since 12/11:  Saw cardiology who d/c the diltiazem/diuretic/K .  Still getting labs for LFT/Renal function.  Eyes are red this morning.  Cough has been intermittent only.   Past Medical History  Diagnosis Date  . CAD (coronary artery disease)   . Chest pain, unspecified   . HLD (hyperlipidemia)   . Impacted cerumen   . Calculus of gallbladder without mention of cholecystitis or obstruction   . IBS (irritable bowel syndrome)   . Abdominal pain, unspecified site   . GERD (gastroesophageal reflux disease)   . Tinnitus   . Seizure disorder   . Unspecified hypothyroidism   . Anemia   . Unspecified adverse effect of other drug, medicinal and biological substance   . Urinary tract infection, site not specified   . Allergic rhinitis, cause unspecified   . Edema   . Spasm of muscle   . Major depressive disorder, single episode, severe, without mention of psychotic behavior   . Arthritis of back   . 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 Osteopenia   . Atrial fibrillation      Family History  Problem Relation Age of Onset  . Angina Mother   . Heart attack Father   . Heart failure Father      History   Social History  . Marital Status: Divorced    Spouse Name: N/A    Number of Children: N/A  . Years of Education: N/A   Occupational History  . hair dresser at [employer]    Social History Main Topics  . Smoking status: Never Smoker   . Smokeless tobacco: Never Used  . Alcohol Use: Yes     socially  . Drug Use: No  . Sexually Active: Not on file   Other Topics Concern  . Not on file   Social History Narrative  . No narrative on file     Allergies  Allergen Reactions  . Codeine   . Penicillins   . Triple Antibiotic     REACTION: rash  . Latex  Rash     Outpatient Prescriptions Prior to Visit  Medication Sig Dispense Refill  . acetaminophen (TYLENOL) 500 MG tablet Take 500 mg by mouth every 6 (six) hours as needed.        Marland Kitchen albuterol (PROAIR HFA) 108 (90 BASE) MCG/ACT inhaler Inhale 2 puffs into the lungs every 4 (four) hours as needed.        . Ascorbic Acid (VITAMIN C) 500 MG tablet Take 500 mg by mouth daily.        Marland Kitchen aspirin 325 MG tablet Take 325 mg by mouth daily.        . beclomethasone (QVAR) 40 MCG/ACT inhaler Inhale 2 puffs into the lungs 2 (two) times daily.        . calcium carbonate (OS-CAL) 600 MG TABS Take 600 mg by mouth 3 (three) times daily with meals.        . cyclobenzaprine (FLEXERIL) 10 MG tablet Take 10 mg by mouth 3 (three) times daily as needed.        . diclofenac (VOLTAREN) 75 MG EC tablet Take 75 mg by mouth 2 (two) times daily.        Marland Kitchen  dronedarone (MULTAQ) 400 MG tablet Take 1 tablet (400 mg total) by mouth 2 (two) times daily with a meal.  60 tablet  11  . ferrous fumarate (HEMOCYTE - 106 MG FE) 325 (106 FE) MG TABS Take 2 tablets by mouth daily.        . fluticasone (FLONASE) 50 MCG/ACT nasal spray 2 sprays by Nasal route daily.        Marland Kitchen ibandronate (BONIVA) 150 MG tablet Take 150 mg by mouth every 30 (thirty) days. Take in the morning with a full glass of water, on an empty stomach, and do not take anything else by mouth or lie down for the next 30 min.       . lansoprazole (PREVACID) 15 MG capsule Take 15 mg by mouth daily.        Marland Kitchen loperamide (IMODIUM A-D) 2 MG tablet Take 2 mg by mouth as needed.        . metoprolol (TOPROL-XL) 50 MG 24 hr tablet Take 50 mg by mouth daily.        . Multiple Vitamin (MULTIVITAMIN) tablet Take 1 tablet by mouth daily.        . nitroGLYCERIN (NITROSTAT) 0.4 MG SL tablet Place 0.4 mg under the tongue every 5 (five) minutes as needed.        Marland Kitchen PHENObarbital (LUMINAL) 60 MG tablet Take 60 mg by mouth 2 (two) times daily.        . rosuvastatin (CRESTOR) 20 MG tablet Take  20 mg by mouth daily.        . sodium chloride (OCEAN) 0.65 % nasal spray 3 sprays by Nasal route 3 (three) times daily.           Review of Systems Constitutional:   No  weight loss, night sweats,  Fevers, chills, fatigue, lassitude. HEENT:   No headaches,  Difficulty swallowing,  Tooth/dental problems,  Sore throat,                No sneezing, itching, ear ache, nasal congestion, post nasal drip,   CV:  No chest pain,  Orthopnea, PND, swelling in lower extremities, anasarca, dizziness, palpitations  GI  No heartburn, indigestion, abdominal pain, nausea, vomiting, diarrhea, change in bowel habits, loss of appetite  Resp: Notes  shortness of breath with exertion not  at rest.  No excess mucus, no productive cough,  Notes  non-productive cough,  No coughing up of blood.  No change in color of mucus.  No wheezing.  No chest wall deformity  Skin: no rash or lesions.  GU: no dysuria, change in color of urine, no urgency or frequency.  No flank pain.  MS:  No joint pain or swelling.  No decreased range of motion.  No back pain.  Psych:  No change in mood or affect. No depression or anxiety.  No memory loss.     Objective:   Physical Exam Gen: Pleasant, well-nourished, in no distress,  normal affect  ENT: No lesions,  mouth clear,  oropharynx clear, no postnasal drip  Neck: No JVD, no TMG, no carotid bruits  Lungs: No use of accessory muscles, no dullness to percussion, clear  Cardiovascular: RRR, heart sounds normal, no murmur or gallops, no peripheral edema  Abdomen: soft and NT, no HSM,  BS normal  Musculoskeletal: No deformities, no cyanosis or clubbing  Neuro: alert, non focal  Skin: Warm, no lesions or rashes      PFT Conversion 08/22/2009  FVC 1.98  FVC  %  Predicted 64.4  FEV1 1.62  FEV1 PREDICT 2.34  FEV % Predicted 69.2  FEV1/FVC 81.8  FEV1/FVC%EXP 106.80  PEF % EXPECT 5.83    Assessment & Plan:   Extrinsic asthma, unspecified Stable moderate persistent  asthma Plan No change in inhaled or maintenance medications. Return in 4months    Updated Medication List Outpatient Encounter Prescriptions as of 09/22/2010  Medication Sig Dispense Refill  . acetaminophen (TYLENOL) 500 MG tablet Take 500 mg by mouth every 6 (six) hours as needed.        Marland Kitchen albuterol (PROAIR HFA) 108 (90 BASE) MCG/ACT inhaler Inhale 2 puffs into the lungs every 4 (four) hours as needed.        . Ascorbic Acid (VITAMIN C) 500 MG tablet Take 500 mg by mouth daily.        Marland Kitchen aspirin 325 MG tablet Take 325 mg by mouth daily.        . beclomethasone (QVAR) 40 MCG/ACT inhaler Inhale 2 puffs into the lungs 2 (two) times daily.        . calcium carbonate (OS-CAL) 600 MG TABS Take 600 mg by mouth 3 (three) times daily with meals.        . cyclobenzaprine (FLEXERIL) 10 MG tablet Take 10 mg by mouth 3 (three) times daily as needed.        . diclofenac (VOLTAREN) 75 MG EC tablet Take 75 mg by mouth 2 (two) times daily.        Marland Kitchen dronedarone (MULTAQ) 400 MG tablet Take 1 tablet (400 mg total) by mouth 2 (two) times daily with a meal.  60 tablet  11  . ferrous fumarate (HEMOCYTE - 106 MG FE) 325 (106 FE) MG TABS Take 2 tablets by mouth daily.        . fluticasone (FLONASE) 50 MCG/ACT nasal spray 2 sprays by Nasal route daily.        Marland Kitchen ibandronate (BONIVA) 150 MG tablet Take 150 mg by mouth every 30 (thirty) days. Take in the morning with a full glass of water, on an empty stomach, and do not take anything else by mouth or lie down for the next 30 min.       . lansoprazole (PREVACID) 15 MG capsule Take 15 mg by mouth daily.        Marland Kitchen loperamide (IMODIUM A-D) 2 MG tablet Take 2 mg by mouth as needed.        . metoprolol (TOPROL-XL) 50 MG 24 hr tablet Take 50 mg by mouth daily.        . Multiple Vitamin (MULTIVITAMIN) tablet Take 1 tablet by mouth daily.        . nitroGLYCERIN (NITROSTAT) 0.4 MG SL tablet Place 0.4 mg under the tongue every 5 (five) minutes as needed.        Marland Kitchen PHENObarbital  (LUMINAL) 60 MG tablet Take 60 mg by mouth 2 (two) times daily.        . rosuvastatin (CRESTOR) 20 MG tablet Take 20 mg by mouth daily.        . sodium chloride (OCEAN) 0.65 % nasal spray 3 sprays by Nasal route 3 (three) times daily.

## 2010-09-28 ENCOUNTER — Other Ambulatory Visit: Payer: Self-pay | Admitting: Cardiology

## 2010-10-06 ENCOUNTER — Telehealth: Payer: Self-pay | Admitting: Family Medicine

## 2010-10-06 ENCOUNTER — Telehealth: Payer: Self-pay | Admitting: Cardiology

## 2010-10-06 DIAGNOSIS — E876 Hypokalemia: Secondary | ICD-10-CM

## 2010-10-06 MED ORDER — POTASSIUM CHLORIDE 10 MEQ PO TBCR: 10.0000 meq | EXTENDED_RELEASE_TABLET | Freq: Every day | ORAL | Status: DC

## 2010-10-06 NOTE — Telephone Encounter (Signed)
Pt called and said that [facility] no longer has prevacid for acid reflux. Pt req change of med be called in to [facility] on [address] and [address].

## 2010-10-06 NOTE — Telephone Encounter (Signed)
Pt calling re lab results °

## 2010-10-06 NOTE — Telephone Encounter (Signed)
I spoke with the pt and she will start potassium chloride daily.  Rx sent to pharmacy.  The pt will have BMP rechecked on 10/21/10.

## 2010-10-08 MED ORDER — OMEPRAZOLE 40 MG PO CPDR: 40.0000 mg | DELAYED_RELEASE_CAPSULE | Freq: Every day | ORAL | Status: DC

## 2010-10-08 NOTE — Telephone Encounter (Signed)
Pt is aware rx has been called in to [facility] for omperazole 40

## 2010-10-08 NOTE — Telephone Encounter (Signed)
Called and spoke with pt; new rx sent to pharmacy

## 2010-10-14 NOTE — Cardiovascular Report (Signed)
NAMEAVION, PATELLA NO.:  [id number]   MEDICAL RECORD NO.:  [id number]          PATIENT TYPE:  INP   LOCATION:  3733                         FACILITY:  [facility]   PHYSICIAN:  Arturo Morton. [name], MD, FACCDATE OF BIRTH:  04/20/1941   DATE OF PROCEDURE:  DATE OF DISCHARGE:                            CARDIAC CATHETERIZATION   INDICATIONS:  Ms. Jonas is a delightful patient of Dr. Scotty Court who I  just met yesterday.  She has a strong family history of coronary artery  disease, and about 2 weeks ago, she became somewhat diaphoretic.  She  has been getting heaviness in the arms the past few days and had an  episode of sweating and some chest discomfort.  She was seen by Dr.  Scotty Court, and there was slight EKG change from previous tracings.  She  was set up for an exercise stress test, came to [facility] and at that  time, the staff deferred to her stress test due to the resting  electrocardiographic changes.  She was seen as an add-on, and admitted  to the hospital.  Her cardiac enzymes have been essentially negative.  Her EKG, after showing ST-segment changes in the inferolateral leads now  demonstrates some anterior T changes.  In addition, the patient does  have a history of cholelithiasis, although her liver function studies  are normal.  Based on her family history and the findings on  electrocardiography, we elected to recommend a cardiac catheterization  to further identify her anatomy.  Risks, benefits, and alternatives were  discussed with the patient.  She consented to proceed.   PROCEDURES:  1. Left heart catheterization.  2. Selective coronary arteriography.  3. Selective left ventriculography.   DESCRIPTION OF PROCEDURE:  The procedure was performed from the right  femoral artery using 5-French catheters.  She tolerated this well  without complication.  Because of the difficulty seeing the ostium of  the left anterior descending artery and with the findings,  we went back  and did a stiff left lateral in RAO views.  There were no complications.  I have reviewed the findings with her brother.  I discussed this with  the patient.  I then reviewed the films with Dr. [name].  She was  taken to the holding area in satisfactory clinical condition.   HEMODYNAMIC DATA:  1. The central aortic pressure was 149/72, mean 105.  2. Left ventricular pressure 138/9.  3. There was no gradient on pullback across the aortic valve.   ANGIOGRAPHIC DATA:  1. Ventriculography done in the RAO projection reveals vigorous global      systolic function.  No segmental abnormalities contraction were      identified.  2. On plain fluoroscopy, there was evidence of calcification of the      proximal LAD.  3. The left coronary artery is a left dominant system.  The left main      appears free of critical disease and there is a large caliber, it      bifurcates into an LAD and circumflex vessel.  4. The left anterior descending artery  courses to the apex and      provides a major diagonal branch.  Of importance, there was some      eccentric plaquing at the ostium of the LAD.  There was a lot of      overlap from the proximal circumflex making visualization somewhat      difficult, but in the LAO caudal views, we will be able to see the      takeoff.  The overall minimum lumen diameter appears to be at least      2 mm or more, and does not appear to be critical.  The remainder of      the LAD appears free of critical disease.  5. The circumflex is a dominant vessel.  There was a large bifurcating      first marginal branch and then subsequent several marginal branches      and then termination as a PDA.  Critical narrowing is not noted      throughout the rest of the circumflex circulation.  6. The right coronary artery is a nondominant vessel, free of critical      disease.   CONCLUSIONS:  1. Well-preserved left ventricular function.  2. Calcification of the  proximal left anterior descending artery.  3. Ostial 50% eccentric narrowing of the left anterior descending      artery, that does not appear to be critical or likely to produce      resting electrocardiographic changes.  4. Dominant circumflex without significant focal narrowing.  5. Nondominant right coronary artery without significant focal      narrowing.   DISPOSITION:  We will fairly impressed with her symptoms.  She does have  gentle T-wave inversion in the anterior precordial leads.  We plan to  repeat an electrocardiogram in the morning.  We will replace her  potassium in the interim as well as her potassium was slightly low.  The  angiographic findings do not suggest critical disease of the LAD and it  is not a favorable location for percutaneous stenting, involving a  narrow-angled bifurcation with the circumflex.  The circumflex comes out  at a fairly narrow angle from the LAD proper.  The LAD does not appear  to be particularly tight, and would not seem to be causing resting  electrocardiographic abnormalities.  We will do a D-dimer.  Some  consideration will be given to fractional flow reserve in intravascular  ultrasound, especially if she has progressive EKG changes.  In the  absence of this, we will likely lean towards medical therapy.  We will  looks for few other causes of her present symptoms.      Arturo Morton. [name], MD, [facility]  Electronically Signed     TDS/MEDQ  D:  01/04/2009  T:  01/05/2009  Job:  [id number]   cc:   Ellin Saba., MD  [facility]

## 2010-10-14 NOTE — Discharge Summary (Signed)
NAMEKRIS, BURD NO.:  [id number]   MEDICAL RECORD NO.:  [id number]          PATIENT TYPE:  INP   LOCATION:  3733                         FACILITY:  [facility]   PHYSICIAN:  Arturo Morton. [name], MD, FACCDATE OF BIRTH:  [birth year]   DATE OF ADMISSION:  01/03/2009  DATE OF DISCHARGE:  01/05/2009                               DISCHARGE SUMMARY   PRIMARY CARDIOLOGIST:  Maisie Fus D. [name], MD, [facility]   DISCHARGE DIAGNOSES:  1. Chest pain, somewhat atypical symptoms with abnormal EKG, status      post cardiac catheterization this admission.  The patient with 50%      left anterior descending lesion, reviewed by Dr. [name] and Dr.      Shirlee Latch, both agree assessment of lesion severity, unlikely to      produce the EKG changes noted.  2. Elevated D-dimer, but no clinical evidence to support pulmonary      embolism.   PAST MEDICAL HISTORY:  Hysterectomy, left wrist fracture with plates,  remote history of tobacco abuse, osteopenia, hypercholesteremia,  cholelithiasis, GERD, and epilepsy.   Ms. Schlachter is a patient followed by Dr. Scotty Court who was referred to Dr.  [name] for followup after she began having episodes of heaviness in her  arms and diaphoresis with some chest discomfort.  The patient was at  initially sent to Nuclear Med Department in our office for stress test,  but because of abnormal EKG (ST depression), the patient was brought  around to see Dr. [name] who is DOD for further evaluation.  Dr.  [name] felt the patient need to be admitted.  EKG showed fairly marked  changes with inferolateral leads.  Therefore, the patient was admitted  to [facility].  She underwent cardiac catheterization on January 04, 2009.  The patient had mild hypokalemia noted.  Cath report as stated above.  Potassium on the day of discharge 4.1, hematocrit 33.2, D-dimer elevated  at 0.66.  Dr. [name] discussed with the patient the results and felt  the patient could be safely discharged  home after ambulating today in  the hall if no problems and arrange for a 2-D echocardiogram and a  stress Myoview in our office on Thursday.  On day of discharge, the  patient still has mild T-wave inversions noted in lateral leads only.  Therefore, the patient without any further episodes of chest discomfort  is being discharged home in stable condition to follow up in our office  on Thursday with the above tests.  I will have office arrange date and  times and call the patient at home.  Prior to discharge, the patient is  afebrile, blood pressure 109/62, heart rate in the 70s.   DISCHARGE MEDICATIONS:  1. K-Dur 20 mEq tablet, the patient is to take half a tablet daily.  2. Toprol-XL 25 mg daily.  3. Nitroglycerin p.r.n.  4. Triamterene/HCTZ 75/50 mg daily.  5. Align OTC as previously taken.  6. Boniva once a month.  7. Calcium and vitamin D as previously prescribed.  8. Ibuprofen p.r.n.  9. Multivitamin daily.  10.Phenobarbital 60 mg b.i.d.  11.Prevacid 50 mg every other day.  12.Simvastatin 40 mg at bedtime.   Duration of discharge encounter over 30 minutes.      Dorian Pod, ACNP      Arturo Morton. [name], MD, [facility]  Electronically Signed    MB/MEDQ  D:  01/05/2009  T:  01/05/2009  Job:  [id number]   cc:   Ellin Saba., MD

## 2010-10-17 NOTE — Op Note (Signed)
[facility]  Patient:    Misty Meyer, Misty Meyer                        MRN: [id number] Adm. Date:  [id number] Attending:  Dominica Severin                           Operative Report  DATE OF BIRTH:  1940/08/10.  PREOPERATIVE DIAGNOSIS:  Displaced left middle phalanx fracture, left small finger.  POSTOPERATIVE DIAGNOSIS:  Displaced left middle phalanx fracture, left small finger.  PROCEDURE:  Closed reduction and percutaneous pinning, left middle phalanx fracture about the small finger.  SURGEON:  Elisha Ponder, M.D.  ASSISTANTDruscilla Brownie. Shela Nevin, P.A.  COMPLICATIONS:  None.  ANESTHESIA:  Bier block with IV sedation.  TOURNIQUET TIME:  Less than 45 minutes.  INDICATION FOR PROCEDURE:  This patient is a very pleasant white female who is 71 years old and presents with the above-mentioned diagnosis.  I have counseled her in regards to the risks and benefits of surgery including risks of infection, bleeding, anesthesia, damage to normal structures and failure of the surgery to accomplish its intended goals of relieving symptoms and restoring function.  DESCRIPTION OF PROCEDURE:  Patient was brought to the operating suite.  Bier block anesthesia was induced by the anesthesia department.  She was given IV sedation appropriate to her needs and then underwent appropriate padding and a thorough prep and drape with Betadine scrub and paint; following this, patient underwent manipulative reduction and placement of two 0.035 K-wires; these were crossed K-wires.  The PIP and DIP joints were able to move freely. Excellent purchase was obtained.  Patient had AP lateral and oblique x-rays taken to verify reduction, etc.  Permanent copies were taken for documentation.  Patient tolerated this well.  Pin caps were placed and pins were bent outside the skin.  Tourniquet was deflated after adequate time was allowed and excellent refill was noted.  A digital  block of 0.25% Marcaine without epinephrine was placed without difficulty.  A sterile compressive dressing and a pin cap protection were placed.  Following sterile compressive dressing, the patient had placement of a plaster splint; this was done to my satisfaction and without difficulty.  Next, the patient was taken to the recovery room where she was noted in stable condition.  All sponge, needle and instrument counts were reported as correct and there were no complications.  She was given a prescription for Vicodin as well as Keflex to be taken as directed.  She will follow up in one week in the office, elevate and keep the area clean and dry and notify us for any problems. DD:  05/27/00 TD:  05/28/00 Job: [id number]

## 2010-10-21 ENCOUNTER — Other Ambulatory Visit (INDEPENDENT_AMBULATORY_CARE_PROVIDER_SITE_OTHER): Payer: Medicare Other | Admitting: *Deleted

## 2010-10-21 ENCOUNTER — Encounter: Payer: Medicare Other | Admitting: *Deleted

## 2010-10-21 DIAGNOSIS — I4891 Unspecified atrial fibrillation: Secondary | ICD-10-CM

## 2010-10-21 DIAGNOSIS — R079 Chest pain, unspecified: Secondary | ICD-10-CM

## 2010-10-21 LAB — BASIC METABOLIC PANEL
BUN: 24 mg/dL — ABNORMAL HIGH (ref 6–23)
CO2: 29 mEq/L (ref 19–32)
Calcium: 9.5 mg/dL (ref 8.4–10.5)
Creatinine, Ser: 1.5 mg/dL — ABNORMAL HIGH (ref 0.4–1.2)
Glucose, Bld: 90 mg/dL (ref 70–99)

## 2010-10-21 NOTE — Progress Notes (Signed)
This encounter was created in error - please disregard.

## 2010-10-24 ENCOUNTER — Telehealth: Payer: Self-pay | Admitting: Cardiology

## 2010-10-24 NOTE — Telephone Encounter (Signed)
Pt was given potassium and had diarrhea yesteday, not so far today, what to do?

## 2010-10-24 NOTE — Telephone Encounter (Signed)
Left message for pt to call back  °

## 2010-10-24 NOTE — Telephone Encounter (Signed)
Pt rtn call-pls call at home number

## 2010-10-24 NOTE — Telephone Encounter (Signed)
Left message at home number to call back. 

## 2010-10-24 NOTE — Telephone Encounter (Signed)
I spoke with the pt and made her aware that her potassium was within normal on her recent blood work.  The pt said that she had an episode of diarrhea yesterday and she does have a history of episodic diarrhea.  I made the pt aware that she needs to continue to take potassium supplement and remain well hydrated.  I told the pt she can take Imodium as needed for diarrhea.

## 2010-10-26 ENCOUNTER — Other Ambulatory Visit: Payer: Self-pay | Admitting: Critical Care Medicine

## 2010-11-04 ENCOUNTER — Telehealth: Payer: Self-pay | Admitting: Critical Care Medicine

## 2010-11-04 MED ORDER — ALBUTEROL SULFATE HFA 108 (90 BASE) MCG/ACT IN AERS: 2.0000 | INHALATION_SPRAY | RESPIRATORY_TRACT | Status: DC | PRN

## 2010-11-04 NOTE — Telephone Encounter (Signed)
Rx was sent to pharm and pt made aware 

## 2010-11-05 ENCOUNTER — Encounter: Payer: Self-pay | Admitting: Family Medicine

## 2010-11-05 ENCOUNTER — Ambulatory Visit (INDEPENDENT_AMBULATORY_CARE_PROVIDER_SITE_OTHER): Payer: Medicare Other | Admitting: Family Medicine

## 2010-11-05 VITALS — BP 118/72 | HR 92 | Temp 98.7°F | Wt 175.0 lb

## 2010-11-05 DIAGNOSIS — M940 Chondrocostal junction syndrome [Tietze]: Secondary | ICD-10-CM

## 2010-11-05 DIAGNOSIS — J45909 Unspecified asthma, uncomplicated: Secondary | ICD-10-CM

## 2010-11-05 DIAGNOSIS — M129 Arthropathy, unspecified: Secondary | ICD-10-CM

## 2010-11-05 DIAGNOSIS — J310 Chronic rhinitis: Secondary | ICD-10-CM

## 2010-11-05 DIAGNOSIS — K219 Gastro-esophageal reflux disease without esophagitis: Secondary | ICD-10-CM

## 2010-11-05 DIAGNOSIS — M1990 Unspecified osteoarthritis, unspecified site: Secondary | ICD-10-CM

## 2010-11-05 MED ORDER — METHYLPREDNISOLONE ACETATE 80 MG/ML IJ SUSP
120.0000 mg | Freq: Once | INTRAMUSCULAR | Status: AC
  Administered 2010-11-05: 120 mg via INTRAMUSCULAR

## 2010-11-16 ENCOUNTER — Other Ambulatory Visit: Payer: Self-pay | Admitting: Family Medicine

## 2010-11-18 ENCOUNTER — Telehealth: Payer: Self-pay | Admitting: Critical Care Medicine

## 2010-11-18 ENCOUNTER — Encounter: Payer: Self-pay | Admitting: Family Medicine

## 2010-11-18 NOTE — Telephone Encounter (Signed)
[id number] to verify medication because proair is on pt med list and refills have already been sent for the proair in April.Carron Curie, CMA

## 2010-11-18 NOTE — Telephone Encounter (Signed)
Returning call.

## 2010-11-18 NOTE — Progress Notes (Signed)
  Subjective:    Patient ID: Misty Meyer, female    DOB: 1941/01/29, 70 y.o.   MRN: [id number] This 70 year old white single female is in to discuss her medical problems and  her medications  She has been under the care of Dr. Nancy Marus cardiologist for atrial Fibrillation as well as being under the care of Dr. Danise Mina for emphysema and asthma She is complaining of pain over the left upper costochondral joint and she had been treated previously for costochondritis She had problems with esophageal pain esophageal spasm and reflux which he takes Prevacid 15 mg each day and when they are gone she will change to omeprazole 20 mg q. Day Patient has diarrhea which is controlled with Imodium Her main reason for the visit is she is getting ready to go to [place] for vacation and would like to review her medications and problems and get her necessary medications HPI    Review of Systemssee history of present illness     Objective:   Physical Exam the patient is a well developed white female who is in no distress is alert and cooperative HEENT her reveals swollen pale nasal mucosa with clear drainage Heart normal size no murmurs regular rhythm Lungs clear to palpation percussion and auscultation no dullness to normal wheezing on expiration Abdomen liver spleen kidney nonpalpable no dullness no masses bowel sounds normal Chest wall tenderness costochondral joints on the left chest ribs 2 through 7         Assessment & Plan:  Costochondritis left chest to treat with Depo-Medrol and continue diclofenac GERD and his Prevacid and then changed to omeprazole 20 milligrams q. day Arthritis and tendon diclofenac 75 mg b.i.d. Asthma continue Qvar as well as albuterol as needed Rhinitis continue fluticasone nasal spray as well as saline nasal spray Atrial fibrillation and CAD continue metoprolol 50 mg q. day

## 2010-11-19 ENCOUNTER — Other Ambulatory Visit: Payer: Self-pay | Admitting: Family Medicine

## 2010-11-19 NOTE — Telephone Encounter (Signed)
Pt called and said that [facility] on [address] sent a refill req for pts Flexeril and Phenobarbital a few days ago, with no response. Pt leaving to go out of town and needs this refilled asap today.

## 2010-11-19 NOTE — Telephone Encounter (Signed)
Returning call again.  [phone]

## 2010-11-19 NOTE — Telephone Encounter (Signed)
Called spoke with patient who states that she has the proair that was refilled 6.5.12 but was unaware that she may take this in the place of the ventolin - which is expired.  Pt verbalized her understanding and will discontinue the ventolin.

## 2010-11-20 MED ORDER — PHENOBARBITAL 60 MG PO TABS: 60.0000 mg | ORAL_TABLET | Freq: Two times a day (BID) | ORAL | Status: DC

## 2010-11-20 MED ORDER — CYCLOBENZAPRINE HCL 10 MG PO TABS: 10.0000 mg | ORAL_TABLET | Freq: Three times a day (TID) | ORAL | Status: DC | PRN

## 2010-11-20 NOTE — Telephone Encounter (Signed)
rx for flexeril and phenobarbital sent to [facility] on [address].  Called and left a message for pt to make aware that prescriptions have been sent to the pharmacy.

## 2010-11-26 ENCOUNTER — Telehealth: Payer: Self-pay | Admitting: Cardiology

## 2010-11-26 ENCOUNTER — Encounter: Payer: Self-pay | Admitting: Cardiology

## 2010-11-26 ENCOUNTER — Ambulatory Visit (INDEPENDENT_AMBULATORY_CARE_PROVIDER_SITE_OTHER): Payer: Medicare Other | Admitting: Cardiology

## 2010-11-26 DIAGNOSIS — E785 Hyperlipidemia, unspecified: Secondary | ICD-10-CM

## 2010-11-26 DIAGNOSIS — I10 Essential (primary) hypertension: Secondary | ICD-10-CM

## 2010-11-26 DIAGNOSIS — I4891 Unspecified atrial fibrillation: Secondary | ICD-10-CM

## 2010-11-26 DIAGNOSIS — I2510 Atherosclerotic heart disease of native coronary artery without angina pectoris: Secondary | ICD-10-CM

## 2010-11-26 DIAGNOSIS — Z136 Encounter for screening for cardiovascular disorders: Secondary | ICD-10-CM

## 2010-11-26 LAB — BASIC METABOLIC PANEL
Chloride: 109 mEq/L (ref 96–112)
GFR: 44.39 mL/min — ABNORMAL LOW (ref >60.00)
Glucose, Bld: 70 mg/dL (ref 70–99)
Potassium: 4.6 mEq/L (ref 3.5–5.1)
Sodium: 143 mEq/L (ref 135–145)

## 2010-11-26 LAB — MAGNESIUM: Magnesium: 2.5 mg/dL (ref 1.5–2.5)

## 2010-11-26 NOTE — Patient Instructions (Signed)
Your physician recommends that you return for lab work today: bmp/magnesium  Your physician recommends that you schedule a follow-up appointment in: 3 months.  Your physician recommends that you continue on your current medications as directed. Please refer to the Current Medication list given to you today.

## 2010-11-26 NOTE — Telephone Encounter (Signed)
Pt saw dr [name] today and he mentioned something about " flat lines" on EKG --pt is going on vacation and is worried that there is something wrong--advised dr [name] would not have let her leave office if there were any problems with EKG--go ahead on vacation and have a good time--pt agrees--nt

## 2010-11-26 NOTE — Telephone Encounter (Signed)
Pt had ekg and was told she had some flat lines, wants to know if ok to go out of town

## 2010-12-04 NOTE — Progress Notes (Signed)
HPI:  The patient is stable.  She is headed to [place] to be with her brother.  Denies chest pain.  Not currently short of breath.   Current Outpatient Prescriptions  Medication Sig Dispense Refill  . acetaminophen (TYLENOL) 500 MG tablet Take 500 mg by mouth every 6 (six) hours as needed.        Marland Kitchen albuterol (PROAIR HFA) 108 (90 BASE) MCG/ACT inhaler Inhale 2 puffs into the lungs every 4 (four) hours as needed for wheezing.  1 Inhaler  3  . Ascorbic Acid (VITAMIN C) 500 MG tablet Take 500 mg by mouth daily.        Marland Kitchen aspirin 325 MG tablet Take 325 mg by mouth daily.        . beclomethasone (QVAR) 40 MCG/ACT inhaler Inhale 2 puffs into the lungs 2 (two) times daily.        . calcium carbonate (OS-CAL) 600 MG TABS Take 600 mg by mouth 3 (three) times daily with meals.        . CRESTOR 20 MG tablet TAKE 1 TABLET BY MOUTH EVERY DAY  30 tablet  5  . cyclobenzaprine (FLEXERIL) 10 MG tablet Take 1 tablet (10 mg total) by mouth 3 (three) times daily as needed.  90 tablet  3  . diclofenac (VOLTAREN) 75 MG EC tablet Take 75 mg by mouth 2 (two) times daily.        Marland Kitchen dronedarone (MULTAQ) 400 MG tablet Take 1 tablet (400 mg total) by mouth 2 (two) times daily with a meal.  60 tablet  11  . ferrous fumarate (HEMOCYTE - 106 MG FE) 325 (106 FE) MG TABS Take 2 tablets by mouth daily.        . fluticasone (FLONASE) 50 MCG/ACT nasal spray 2 sprays by Nasal route daily.        Marland Kitchen ibandronate (BONIVA) 150 MG tablet Take 150 mg by mouth every 30 (thirty) days. Take in the morning with a full glass of water, on an empty stomach, and do not take anything else by mouth or lie down for the next 30 min.       Marland Kitchen loperamide (IMODIUM A-D) 2 MG tablet Take 2 mg by mouth as needed.        . metoprolol (TOPROL-XL) 50 MG 24 hr tablet TAKE 1 TABLET BY MOUTH EVERY DAY  30 tablet  5  . Multiple Vitamin (MULTIVITAMIN) tablet Take 1 tablet by mouth daily.        . nitroGLYCERIN (NITROSTAT) 0.4 MG SL tablet Place 0.4 mg under the tongue  every 5 (five) minutes as needed.        Marland Kitchen PHENObarbital (LUMINAL) 60 MG tablet Take 1 tablet (60 mg total) by mouth 2 (two) times daily.  60 tablet  5  . potassium chloride (KLOR-CON) 10 MEQ CR tablet Take 1 tablet (10 mEq total) by mouth daily.  30 tablet  11  . sodium chloride (OCEAN) 0.65 % nasal spray 3 sprays by Nasal route 3 (three) times daily.       Marland Kitchen omeprazole (PRILOSEC) 40 MG capsule Take 1 capsule (40 mg total) by mouth daily.  30 capsule  11    Allergies  Allergen Reactions  . Codeine   . Penicillins   . Triple Antibiotic     REACTION: rash  . Latex Rash    Past Medical History  Diagnosis Date  . CAD (coronary artery disease)   . Chest pain, unspecified   . HLD (  hyperlipidemia)   . Impacted cerumen   . Calculus of gallbladder without mention of cholecystitis or obstruction   . IBS (irritable bowel syndrome)   . Abdominal pain, unspecified site   . GERD (gastroesophageal reflux disease)   . Tinnitus   . Seizure disorder   . Unspecified hypothyroidism   . Anemia   . Unspecified adverse effect of other drug, medicinal and biological substance   . Urinary tract infection, site not specified   . Allergic rhinitis, cause unspecified   . Edema   . Spasm of muscle   . Major depressive disorder, single episode, severe, without mention of psychotic behavior   . Arthritis of back   . 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 Osteopenia   . Atrial fibrillation     Past Surgical History  Procedure Date  . Total abdominal hysterectomy   . Wrist fracture surgery     with plates (left)    Family History  Problem Relation Age of Onset  . Angina Mother   . Heart attack Father   . Heart failure Father     History   Social History  . Marital Status: Divorced    Spouse Name: N/A    Number of Children: N/A  . Years of Education: N/A   Occupational History  . hair dresser at [employer]    Social History Main Topics  . Smoking status: Never Smoker   .  Smokeless tobacco: Never Used  . Alcohol Use: Yes     socially  . Drug Use: No  . Sexually Active: No   Other Topics Concern  . Not on file   Social History Narrative  . No narrative on file    ROS: Please see the HPI.  All other systems reviewed and negative.  PHYSICAL EXAM:  BP 109/73  Ht 5\' 5"  (1.651 m)  Wt 170 lb 1.9 oz (77.166 kg)  BMI 28.31 kg/m2  General: Well developed, well nourished, in no acute distress. Head:  Normocephalic and atraumatic. Neck: no JVD Lungs: Clear to auscultation and percussion. Heart: Normal S1 and S2.  No murmur, rubs or gallops.  Abdomen:  Normal bowel sounds; soft; non tender; no organomegaly Pulses: Pulses normal in all 4 extremities. Extremities: No clubbing or cyanosis. No edema. Neurologic: Alert and oriented x 3.  EKG:  NSR.  Possible LAE.  Nonspecific ST and T abnormality.  ASSESSMENT AND PLAN:

## 2010-12-04 NOTE — Assessment & Plan Note (Signed)
No recurrent symptoms at present.  Continue on current medications.  Have discussed volataren and ASA.

## 2010-12-04 NOTE — Assessment & Plan Note (Signed)
No current recurrence.  On rate control medications, and ASA as prevention.

## 2010-12-04 NOTE — Assessment & Plan Note (Signed)
At [facility] one year ago.  Needs to have repeat labs.

## 2010-12-06 ENCOUNTER — Encounter: Payer: Self-pay | Admitting: Cardiology

## 2010-12-07 ENCOUNTER — Encounter: Payer: Self-pay | Admitting: Cardiology

## 2010-12-17 ENCOUNTER — Telehealth: Payer: Self-pay | Admitting: Cardiology

## 2010-12-17 NOTE — Telephone Encounter (Signed)
Pt called and had a seizure while on vacation.  (7-7 to 7-9 in hosp in [place])  Would like to know who to use for a neurologist.  please call pt.

## 2010-12-17 NOTE — Telephone Encounter (Signed)
Pt calling stating had seizure while on vacation and would like to get name of neurologist in [place]--advied to call [facility] and ask for new pt appoint with dr Laureen Ochs

## 2010-12-17 NOTE — Telephone Encounter (Signed)
Per call from Dr. [name] pt had a seizure while on vacation and Dr. [name] wanted to see tomorrow to go over medications and to get her set up with neuro provider. I called pt and she said she can't come in on Tuesday and Wednesday so I explained to patient how important it was for her to come in and she explained that her mgr at work dose not know she has epilepsy and she dose not want to tell them because she dose not know how they will react again I explained how important this was and explained I will let  Dr. [name] know. Dr. [name] told me to schedule her for the next day he is in the office and call Dr. Charmian Muff office which is her pcp and schedule her appt. And let them know what happen.

## 2010-12-17 NOTE — Telephone Encounter (Signed)
Dr [name] asked that we call this pt and have her come in to office so we can expedite her visit to [facility] for seizure disorder--pt was called as explained in previous note and refuses to come in, due to ? jeoprody  of  employer finding out about condition--nt

## 2010-12-23 ENCOUNTER — Encounter: Payer: Self-pay | Admitting: Family Medicine

## 2010-12-23 ENCOUNTER — Ambulatory Visit (INDEPENDENT_AMBULATORY_CARE_PROVIDER_SITE_OTHER): Payer: Medicare Other | Admitting: Family Medicine

## 2010-12-23 VITALS — BP 122/80 | HR 76 | Temp 98.3°F | Wt 174.0 lb

## 2010-12-23 DIAGNOSIS — G40909 Epilepsy, unspecified, not intractable, without status epilepticus: Secondary | ICD-10-CM

## 2010-12-23 DIAGNOSIS — N390 Urinary tract infection, site not specified: Secondary | ICD-10-CM

## 2010-12-23 NOTE — Patient Instructions (Signed)
Have made referral to Neurologist, Dr. Anne Hahn Keep your appointment with  Dr. Tedra Senegal tomorrow and discuss with him what you and I have discussed and have planned and see if he approved. Show him all the records you have from the hospitalization in [place] in the note from the neurologist Continue your present medications It is my opinion that she should inform your implore the fact that you do have epilepsy

## 2010-12-23 NOTE — Progress Notes (Signed)
  Subjective:    Patient ID: Misty Meyer, female    DOB: [birth year], 70 y.o.   MRN: [id number] This 70 year old single white female is in today with a history of having had a grand mal seizure on July 7 she was in [place] and was hospitalized from July 7 to July 9 and treated by u neurologist who recommended continuing phenobarbital twice daily as well as Keppa twice daily. Also recommended that she see a neurologist in [place] to repeat the EEG and she has a copy of the CT scan that was done previously Her seizure history consists of her first seizure at age 50 and they have been well controlled and described as petit mal in the past and has been very rare that she had a seizure and has not had a seizure over the past 13 years at her blood levels of her medications have been Monitored and The patient has an appointment with Dr. Bonnee Quin in one day and I am going to put in a referral for Dr. Anne Hahn on an urgent basis  Her cardiology problems and other medical problems are under control HPI    Review of Systems see history of present illness     Objective:   Physical Exam patient's a well-built well-nourished white female who is alert well aware of her environment and remembers  well sequence of events since her seizure but not aware of what happened at the time of the seizure. No new positive physical findings        Assessment & Plan:  Patient had a grand mal seizure on 12/06/2010 no problem since he needs to have a consultation and treatment with a neurologist as well as an EEG she R. he has copy of the CT scan done in [place] Atrial fibb under control Myositis and muscle spasm under control with Flexeril 10 mg 3 times a day Made referral to Dr. Anne Hahn, neurologist

## 2010-12-23 NOTE — Telephone Encounter (Signed)
Please see previous phone note.  

## 2010-12-24 ENCOUNTER — Telehealth: Payer: Self-pay | Admitting: Cardiology

## 2010-12-24 ENCOUNTER — Ambulatory Visit (INDEPENDENT_AMBULATORY_CARE_PROVIDER_SITE_OTHER): Payer: Medicare Other | Admitting: Cardiology

## 2010-12-24 ENCOUNTER — Encounter: Payer: Self-pay | Admitting: Cardiology

## 2010-12-24 DIAGNOSIS — G40909 Epilepsy, unspecified, not intractable, without status epilepticus: Secondary | ICD-10-CM

## 2010-12-24 DIAGNOSIS — I2510 Atherosclerotic heart disease of native coronary artery without angina pectoris: Secondary | ICD-10-CM

## 2010-12-24 DIAGNOSIS — I4891 Unspecified atrial fibrillation: Secondary | ICD-10-CM

## 2010-12-24 DIAGNOSIS — E785 Hyperlipidemia, unspecified: Secondary | ICD-10-CM

## 2010-12-24 DIAGNOSIS — R569 Unspecified convulsions: Secondary | ICD-10-CM

## 2010-12-24 LAB — BASIC METABOLIC PANEL
Calcium: 9 mg/dL (ref 8.4–10.5)
GFR: 46.48 mL/min — ABNORMAL LOW (ref >60.00)
Potassium: 4.1 mEq/L (ref 3.5–5.1)
Sodium: 142 mEq/L (ref 135–145)

## 2010-12-24 LAB — TSH: TSH: 1.36 u[IU]/mL (ref 0.35–5.50)

## 2010-12-24 LAB — MAGNESIUM: Magnesium: 2.4 mg/dL (ref 1.5–2.5)

## 2010-12-24 MED ORDER — LEVETIRACETAM 500 MG PO TABS: 500.0000 mg | ORAL_TABLET | Freq: Two times a day (BID) | ORAL | Status: DC

## 2010-12-24 NOTE — Telephone Encounter (Addendum)
Release Of Information Faxed to: [facility] @ [phone] 12/24/10/km  Records received from [facility]  Gave to [place] 06/24/11/KM

## 2010-12-24 NOTE — Assessment & Plan Note (Signed)
No obvious recurrence, but unknown.  Now in NSR on ECG.

## 2010-12-24 NOTE — Assessment & Plan Note (Signed)
Now on Keppra.  Needs to see Neurology.  Dr. Scotty Court is arranging per patient.

## 2010-12-24 NOTE — Assessment & Plan Note (Signed)
No recurrent chest pain.  Doing well from this standpoint.

## 2010-12-24 NOTE — Progress Notes (Signed)
HPI:  Patient is stable.  She was admitted to [facility] after going to [facility] with a grand mal seizure.  Has not driven in years.  Long history of seizures since age 70, but petty mal.  Has seen Dr. Scotty Court who is trying to get her a neuro appointment in [place].  Keppra runs out in a couple of weeks.  No chest pain or cardiac symptoms. Doing ok otherwise.  No recurrence.  Extent of work up is unknown at this point.  We are requesting dc summary from [facility].  Back to normal.    Current Outpatient Prescriptions  Medication Sig Dispense Refill  . acetaminophen (TYLENOL) 500 MG tablet Take 500 mg by mouth every 6 (six) hours as needed.        Marland Kitchen albuterol (PROAIR HFA) 108 (90 BASE) MCG/ACT inhaler Inhale 2 puffs into the lungs every 4 (four) hours as needed for wheezing.  1 Inhaler  3  . Ascorbic Acid (VITAMIN C) 500 MG tablet Take 500 mg by mouth daily.        Marland Kitchen aspirin 325 MG tablet Take 325 mg by mouth daily.        . beclomethasone (QVAR) 40 MCG/ACT inhaler Inhale 2 puffs into the lungs 2 (two) times daily.        . calcium carbonate (OS-CAL) 600 MG TABS Take 600 mg by mouth 3 (three) times daily with meals.        . CRESTOR 20 MG tablet TAKE 1 TABLET BY MOUTH EVERY DAY  30 tablet  5  . cyclobenzaprine (FLEXERIL) 10 MG tablet Take 1 tablet (10 mg total) by mouth 3 (three) times daily as needed.  90 tablet  3  . diclofenac (VOLTAREN) 75 MG EC tablet Take 75 mg by mouth 2 (two) times daily.        Marland Kitchen dronedarone (MULTAQ) 400 MG tablet Take 1 tablet (400 mg total) by mouth 2 (two) times daily with a meal.  60 tablet  11  . ferrous fumarate (HEMOCYTE - 106 MG FE) 325 (106 FE) MG TABS Take 2 tablets by mouth daily.        . fluticasone (FLONASE) 50 MCG/ACT nasal spray 2 sprays by Nasal route daily.        Marland Kitchen ibandronate (BONIVA) 150 MG tablet Take 150 mg by mouth every 30 (thirty) days. Take in the morning with a full glass of water, on an empty stomach, and do not take anything else by mouth or lie down  for the next 30 min.       . levETIRAcetam (KEPPRA) 500 MG tablet       . loperamide (IMODIUM A-D) 2 MG tablet Take 2 mg by mouth as needed.        . metoprolol (TOPROL-XL) 50 MG 24 hr tablet TAKE 1 TABLET BY MOUTH EVERY DAY  30 tablet  5  . Multiple Vitamin (MULTIVITAMIN) tablet Take 1 tablet by mouth daily.        . nitroGLYCERIN (NITROSTAT) 0.4 MG SL tablet Place 0.4 mg under the tongue every 5 (five) minutes as needed.        Marland Kitchen omeprazole (PRILOSEC) 40 MG capsule Take 1 capsule (40 mg total) by mouth daily.  30 capsule  11  . PHENObarbital (LUMINAL) 60 MG tablet Take 1 tablet (60 mg total) by mouth 2 (two) times daily.  60 tablet  5  . potassium chloride (KLOR-CON) 10 MEQ CR tablet Take 1 tablet (10 mEq total) by mouth  daily.  30 tablet  11  . sodium chloride (OCEAN) 0.65 % nasal spray 3 sprays by Nasal route 3 (three) times daily.       Marland Kitchen sulfamethoxazole-trimethoprim (BACTRIM DS) 800-160 MG per tablet         Allergies  Allergen Reactions  . Codeine   . Penicillins   . Triple Antibiotic     REACTION: rash  . Latex Rash    Past Medical History  Diagnosis Date  . CAD (coronary artery disease)   . Chest pain, unspecified   . HLD (hyperlipidemia)   . Impacted cerumen   . Calculus of gallbladder without mention of cholecystitis or obstruction   . IBS (irritable bowel syndrome)   . Abdominal pain, unspecified site   . GERD (gastroesophageal reflux disease)   . Tinnitus   . Seizure disorder   . Unspecified hypothyroidism   . Anemia   . Unspecified adverse effect of other drug, medicinal and biological substance   . Urinary tract infection, site not specified   . Allergic rhinitis, cause unspecified   . Edema   . Spasm of muscle   . Major depressive disorder, single episode, severe, without mention of psychotic behavior   . Arthritis of back   . 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 Osteopenia   . Atrial fibrillation     Past Surgical History  Procedure Date  . Total  abdominal hysterectomy   . Wrist fracture surgery     with plates (left)    Family History  Problem Relation Age of Onset  . Angina Mother   . Heart attack Father   . Heart failure Father     History   Social History  . Marital Status: Divorced    Spouse Name: N/A    Number of Children: N/A  . Years of Education: N/A   Occupational History  . hair dresser at [employer]    Social History Main Topics  . Smoking status: Never Smoker   . Smokeless tobacco: Never Used  . Alcohol Use: Yes     socially  . Drug Use: No  . Sexually Active: No   Other Topics Concern  . Not on file   Social History Narrative  . No narrative on file    ROS: Please see the HPI.  All other systems reviewed and negative.  PHYSICAL EXAM:  BP 148/82  Pulse 80  Ht 5\' 5"  (1.651 m)  Wt 173 lb (78.472 kg)  BMI 28.79 kg/m2  General: Well developed, well nourished, in no acute distress. Head:  Normocephalic and atraumatic. Neck: no JVD Lungs: Clear to auscultation and percussion.  Minimal prolonged expiration. Heart: Normal S1 and S2.  No murmur, rubs or gallops.  Abdomen:  Normal bowel sounds; soft; non tender; no organomegaly Pulses: Pulses normal in all 4 extremities. Extremities: No clubbing or cyanosis. No edema. Neurologic: Alert and oriented x 3.  EKG:  NSR.  LAE.  Low voltage QRS.  Delay in R wave progression.   Non specific T wave flattening. ASSESSMENT AND PLAN:

## 2010-12-24 NOTE — Patient Instructions (Signed)
Your physician recommends that you have lab work today bmp and magnesium  We will call you with your lab results. Your physician recommends that you schedule a follow-up appointment in: 3 months with Dr. [name]

## 2010-12-29 ENCOUNTER — Telehealth: Payer: Self-pay | Admitting: Cardiology

## 2010-12-29 DIAGNOSIS — D649 Anemia, unspecified: Secondary | ICD-10-CM

## 2010-12-29 NOTE — Telephone Encounter (Signed)
Order for CBC put in

## 2010-12-30 ENCOUNTER — Telehealth: Payer: Self-pay | Admitting: Cardiology

## 2010-12-30 ENCOUNTER — Other Ambulatory Visit (INDEPENDENT_AMBULATORY_CARE_PROVIDER_SITE_OTHER): Payer: Medicare Other | Admitting: *Deleted

## 2010-12-30 DIAGNOSIS — D649 Anemia, unspecified: Secondary | ICD-10-CM

## 2010-12-30 LAB — CBC WITH DIFFERENTIAL/PLATELET
Eosinophils Relative: 0 % (ref 0.0–5.0)
Lymphocytes Relative: 27.5 % (ref 12.0–46.0)
Monocytes Absolute: 0.3 10*3/uL (ref 0.1–1.0)
Monocytes Relative: 5.6 % (ref 3.0–12.0)
Neutrophils Relative %: 66.5 % (ref 43.0–77.0)
Platelets: 181 10*3/uL (ref 150.0–400.0)
RBC: 3.52 Mil/uL — ABNORMAL LOW (ref 3.87–5.11)
WBC: 5.7 10*3/uL (ref 4.5–10.5)

## 2010-12-30 NOTE — Telephone Encounter (Signed)
[facility] to get [facility]ppra and it had not been called in.  Please check and call [facility] on [address].

## 2010-12-30 NOTE — Telephone Encounter (Signed)
Order put in for stool hemoccult.

## 2010-12-31 ENCOUNTER — Telehealth: Payer: Self-pay | Admitting: Family Medicine

## 2010-12-31 NOTE — Telephone Encounter (Signed)
Left a message for patient stating that Ms. Floren needs to contact her Primary Care MD to refill Keppra.

## 2010-12-31 NOTE — Telephone Encounter (Signed)
Advised patient to contact Dr. Scotty Court. She agrees.

## 2010-12-31 NOTE — Telephone Encounter (Signed)
keppra 500 mg. [facility] [phone].

## 2010-12-31 NOTE — Telephone Encounter (Signed)
Pt requesting refill on levETIRAcetam (KEPPRA) 500 MG tablet. Only has 3 days left of medication.  Send to [facility] on [address]

## 2011-01-02 MED ORDER — LEVETIRACETAM 500 MG PO TABS: 500.0000 mg | ORAL_TABLET | Freq: Two times a day (BID) | ORAL | Status: DC

## 2011-01-02 NOTE — Telephone Encounter (Signed)
rx sent into pharmacy

## 2011-01-11 NOTE — Assessment & Plan Note (Signed)
Last LDL on Crestor was excellent.  Continue.

## 2011-01-13 ENCOUNTER — Other Ambulatory Visit: Payer: Self-pay | Admitting: Cardiology

## 2011-01-13 ENCOUNTER — Other Ambulatory Visit: Payer: Medicare Other

## 2011-01-13 LAB — HEMOCCULT SLIDES (X 3 CARDS)
Fecal Occult Blood: NEGATIVE
OCCULT 1: NEGATIVE
OCCULT 5: NEGATIVE

## 2011-01-20 ENCOUNTER — Ambulatory Visit (INDEPENDENT_AMBULATORY_CARE_PROVIDER_SITE_OTHER): Payer: Medicare Other | Admitting: Critical Care Medicine

## 2011-01-20 ENCOUNTER — Encounter: Payer: Self-pay | Admitting: Critical Care Medicine

## 2011-01-20 DIAGNOSIS — J45909 Unspecified asthma, uncomplicated: Secondary | ICD-10-CM

## 2011-01-20 DIAGNOSIS — J984 Other disorders of lung: Secondary | ICD-10-CM

## 2011-01-20 NOTE — Assessment & Plan Note (Signed)
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

## 2011-01-20 NOTE — Progress Notes (Deleted)
  Subjective:    Patient ID: Misty Meyer, female    DOB: Feb 18, 1941, 70 y.o.   MRN: [id number]  HPI    Review of Systems     Objective:   Physical Exam        Assessment & Plan:

## 2011-01-20 NOTE — Patient Instructions (Signed)
No change in medications. Return in        6 months        

## 2011-01-20 NOTE — Progress Notes (Signed)
Subjective:    Patient ID: Misty Meyer, female    DOB: [birth year], 70 y.o.   MRN: [id number]  HPI  70 y.o. female with known hx of asthmatic bronchitis, abn CXR with LLL scar, ? MAC ? TB in past.   09/22/10 Since 12/11:  Saw cardiology who d/c the diltiazem/diuretic/K .  Still getting labs for LFT/Renal function.  Eyes are red this morning.  Cough has been intermittent only.    01/20/2011 Had seizure 7/12, prior hx of sz since 70yrs old.  Now sees Lesia Sago.  Now only a dry cough Pt denies any significant sore throat, nasal congestion or excess secretions, fever, chills, sweats, unintended weight loss, pleurtic or exertional chest pain, orthopnea PND, or leg swelling Pt denies any increase in rescue therapy over baseline, denies waking up needing it or having any early am or nocturnal exacerbations of coughing/wheezing/or dyspnea. Pt also denies any obvious fluctuation in symptoms with  weather or environmental change or other alleviating or aggravating factors   Past Medical History  Diagnosis Date  . CAD (coronary artery disease)   . Chest pain, unspecified   . HLD (hyperlipidemia)   . Impacted cerumen   . Calculus of gallbladder without mention of cholecystitis or obstruction   . IBS (irritable bowel syndrome)   . Abdominal pain, unspecified site   . GERD (gastroesophageal reflux disease)   . Tinnitus   . Seizure disorder     Grand mal seizure 7/12, [facility]  . Unspecified hypothyroidism   . Anemia   . Unspecified adverse effect of other drug, medicinal and biological substance   . Urinary tract infection, site not specified   . Allergic rhinitis, cause unspecified   . Edema   . Spasm of muscle   . Major depressive disorder, single episode, severe, without mention of psychotic behavior   . Arthritis of back   . 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 Osteopenia   . Atrial fibrillation      Family History  Problem Relation Age of Onset  . Angina Mother   . Heart  attack Father   . Heart failure Father      History   Social History  . Marital Status: Divorced    Spouse Name: N/A    Number of Children: N/A  . Years of Education: N/A   Occupational History  . hair dresser at [employer]    Social History Main Topics  . Smoking status: Never Smoker   . Smokeless tobacco: Never Used  . Alcohol Use: Yes     socially  . Drug Use: No  . Sexually Active: No   Other Topics Concern  . Not on file   Social History Narrative  . No narrative on file     Allergies  Allergen Reactions  . Codeine   . Penicillins   . Triple Antibiotic     REACTION: rash  . Latex Rash     Outpatient Prescriptions Prior to Visit  Medication Sig Dispense Refill  . acetaminophen (TYLENOL) 500 MG tablet Take 500 mg by mouth every 6 (six) hours as needed.        Marland Kitchen albuterol (PROAIR HFA) 108 (90 BASE) MCG/ACT inhaler Inhale 2 puffs into the lungs every 4 (four) hours as needed for wheezing.  1 Inhaler  3  . Ascorbic Acid (VITAMIN C) 500 MG tablet Take 500 mg by mouth daily.        Marland Kitchen aspirin 325 MG tablet  Take 325 mg by mouth daily.        . beclomethasone (QVAR) 40 MCG/ACT inhaler Inhale 2 puffs into the lungs 2 (two) times daily.        . calcium carbonate (OS-CAL) 600 MG TABS Take 600 mg by mouth 3 (three) times daily with meals.        . CRESTOR 20 MG tablet TAKE 1 TABLET BY MOUTH EVERY DAY  30 tablet  5  . cyclobenzaprine (FLEXERIL) 10 MG tablet Take 1 tablet (10 mg total) by mouth 3 (three) times daily as needed.  90 tablet  3  . diclofenac (VOLTAREN) 75 MG EC tablet Take 75 mg by mouth 2 (two) times daily.        Marland Kitchen dronedarone (MULTAQ) 400 MG tablet Take 1 tablet (400 mg total) by mouth 2 (two) times daily with a meal.  60 tablet  11  . fluticasone (FLONASE) 50 MCG/ACT nasal spray 2 sprays by Nasal route daily.        Marland Kitchen ibandronate (BONIVA) 150 MG tablet Take 150 mg by mouth every 30 (thirty) days. Take in the morning with a full glass of water, on  an empty stomach, and do not take anything else by mouth or lie down for the next 30 min.       . levETIRAcetam (KEPPRA) 500 MG tablet Take 1 tablet (500 mg total) by mouth 2 (two) times daily.  60 tablet  1  . loperamide (IMODIUM A-D) 2 MG tablet Take 2 mg by mouth as needed.        . metoprolol (TOPROL-XL) 50 MG 24 hr tablet TAKE 1 TABLET BY MOUTH EVERY DAY  30 tablet  5  . Multiple Vitamin (MULTIVITAMIN) tablet Take 1 tablet by mouth daily.        . nitroGLYCERIN (NITROSTAT) 0.4 MG SL tablet Place 0.4 mg under the tongue every 5 (five) minutes as needed.        Marland Kitchen omeprazole (PRILOSEC) 40 MG capsule Take 1 capsule (40 mg total) by mouth daily.  30 capsule  11  . PHENObarbital (LUMINAL) 60 MG tablet Take 1 tablet (60 mg total) by mouth 2 (two) times daily.  60 tablet  5  . potassium chloride (KLOR-CON) 10 MEQ CR tablet Take 1 tablet (10 mEq total) by mouth daily.  30 tablet  11  . sodium chloride (OCEAN) 0.65 % nasal spray 3 sprays by Nasal route 3 (three) times daily.       . ferrous fumarate (HEMOCYTE - 106 MG FE) 325 (106 FE) MG TABS Take 2 tablets by mouth daily.        Marland Kitchen sulfamethoxazole-trimethoprim (BACTRIM DS) 800-160 MG per tablet           Review of Systems  Constitutional:   No  weight loss, night sweats,  Fevers, chills, fatigue, lassitude. HEENT:   No headaches,  Difficulty swallowing,  Tooth/dental problems,  Sore throat,                No sneezing, itching, ear ache, nasal congestion, post nasal drip,   CV:  No chest pain,  Orthopnea, PND, swelling in lower extremities, anasarca, dizziness, palpitations  GI  No heartburn, indigestion, abdominal pain, nausea, vomiting, diarrhea, change in bowel habits, loss of appetite  Resp: Notes  shortness of breath with exertion not  at rest.  No excess mucus, no productive cough,  Notes  non-productive cough,  No coughing up of blood.  No change in color  of mucus.  No wheezing.  No chest wall deformity  Skin: no rash or lesions.  GU:  no dysuria, change in color of urine, no urgency or frequency.  No flank pain.  MS:  No joint pain or swelling.  No decreased range of motion.  No back pain.  Psych:  No change in mood or affect. No depression or anxiety.  No memory loss.     Objective:   Physical Exam  Gen: Pleasant, well-nourished, in no distress,  normal affect  ENT: No lesions,  mouth clear,  oropharynx clear, no postnasal drip  Neck: No JVD, no TMG, no carotid bruits  Lungs: No use of accessory muscles, no dullness to percussion, clear  Cardiovascular: RRR, heart sounds normal, no murmur or gallops, no peripheral edema  Abdomen: soft and NT, no HSM,  BS normal  Musculoskeletal: No deformities, no cyanosis or clubbing  Neuro: alert, non focal  Skin: Warm, no lesions or rashes      PFT Conversion 08/22/2009  FVC 1.98  FVC  % Predicted 64.4  FEV1 1.62  FEV1 PREDICT 2.34  FEV % Predicted 69.2  FEV1/FVC 81.8  FEV1/FVC%EXP 106.80  PEF % EXPECT 5.83    Assessment & Plan:   Extrinsic asthma, unspecified Stable moderate persistent asthma Plan Cont Qvar daily   LUNG NODULE 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 acetaminophen (TYLENOL) 500 MG tablet Take 500 mg by mouth every 6 (six) hours as needed.        Marland Kitchen albuterol (PROAIR HFA) 108 (90 BASE) MCG/ACT inhaler Inhale 2 puffs into the lungs every 4 (four) hours as needed for wheezing.  1 Inhaler  3  . Ascorbic Acid (VITAMIN C) 500 MG tablet Take 500 mg by mouth daily.        Marland Kitchen aspirin 325 MG tablet Take 325 mg by mouth daily.        . beclomethasone (QVAR) 40 MCG/ACT inhaler Inhale 2 puffs into the lungs 2 (two) times daily.        . calcium carbonate (OS-CAL) 600 MG TABS Take 600 mg by mouth 3 (three) times daily with meals.        . CRESTOR 20 MG tablet TAKE 1 TABLET BY MOUTH  EVERY DAY  30 tablet  5  . cyclobenzaprine (FLEXERIL) 10 MG tablet Take 1 tablet (10 mg total) by mouth 3 (three) times daily as needed.  90 tablet  3  . diclofenac (VOLTAREN) 75 MG EC tablet Take 75 mg by mouth 2 (two) times daily.        Marland Kitchen dronedarone (MULTAQ) 400 MG tablet Take 1 tablet (400 mg total) by mouth 2 (two) times daily with a meal.  60 tablet  11  . fluticasone (FLONASE) 50 MCG/ACT nasal spray 2 sprays by Nasal route daily.        Marland Kitchen ibandronate (BONIVA) 150 MG tablet Take 150 mg by mouth every 30 (thirty) days. Take in the morning with a full glass of water, on an empty stomach, and do not take anything else by mouth or lie down for the next 30 min.       . levETIRAcetam (KEPPRA) 500 MG tablet Take 1 tablet (500 mg total) by mouth 2 (two) times daily.  60 tablet  1  . loperamide (IMODIUM A-D) 2 MG tablet Take  2 mg by mouth as needed.        . metoprolol (TOPROL-XL) 50 MG 24 hr tablet TAKE 1 TABLET BY MOUTH EVERY DAY  30 tablet  5  . Multiple Vitamin (MULTIVITAMIN) tablet Take 1 tablet by mouth daily.        . nitroGLYCERIN (NITROSTAT) 0.4 MG SL tablet Place 0.4 mg under the tongue every 5 (five) minutes as needed.        Marland Kitchen omeprazole (PRILOSEC) 40 MG capsule Take 1 capsule (40 mg total) by mouth daily.  30 capsule  11  . PHENObarbital (LUMINAL) 60 MG tablet Take 1 tablet (60 mg total) by mouth 2 (two) times daily.  60 tablet  5  . potassium chloride (KLOR-CON) 10 MEQ CR tablet Take 1 tablet (10 mEq total) by mouth daily.  30 tablet  11  . sodium chloride (OCEAN) 0.65 % nasal spray 3 sprays by Nasal route 3 (three) times daily.       Marland Kitchen DISCONTD: ferrous fumarate (HEMOCYTE - 106 MG FE) 325 (106 FE) MG TABS Take 2 tablets by mouth daily.        Marland Kitchen DISCONTD: sulfamethoxazole-trimethoprim (BACTRIM DS) 800-160 MG per tablet

## 2011-01-20 NOTE — Assessment & Plan Note (Signed)
Stable moderate persistent asthma Plan Cont Qvar daily

## 2011-01-25 IMAGING — CR DG CHEST 1V PORT
1 series · 1 of 1 positions shown · non-contrast
Comparison: 04/24/2010.

CLINICAL DATA: Tachycardia.

PORTABLE CHEST - 1 VIEW

[view not recorded]
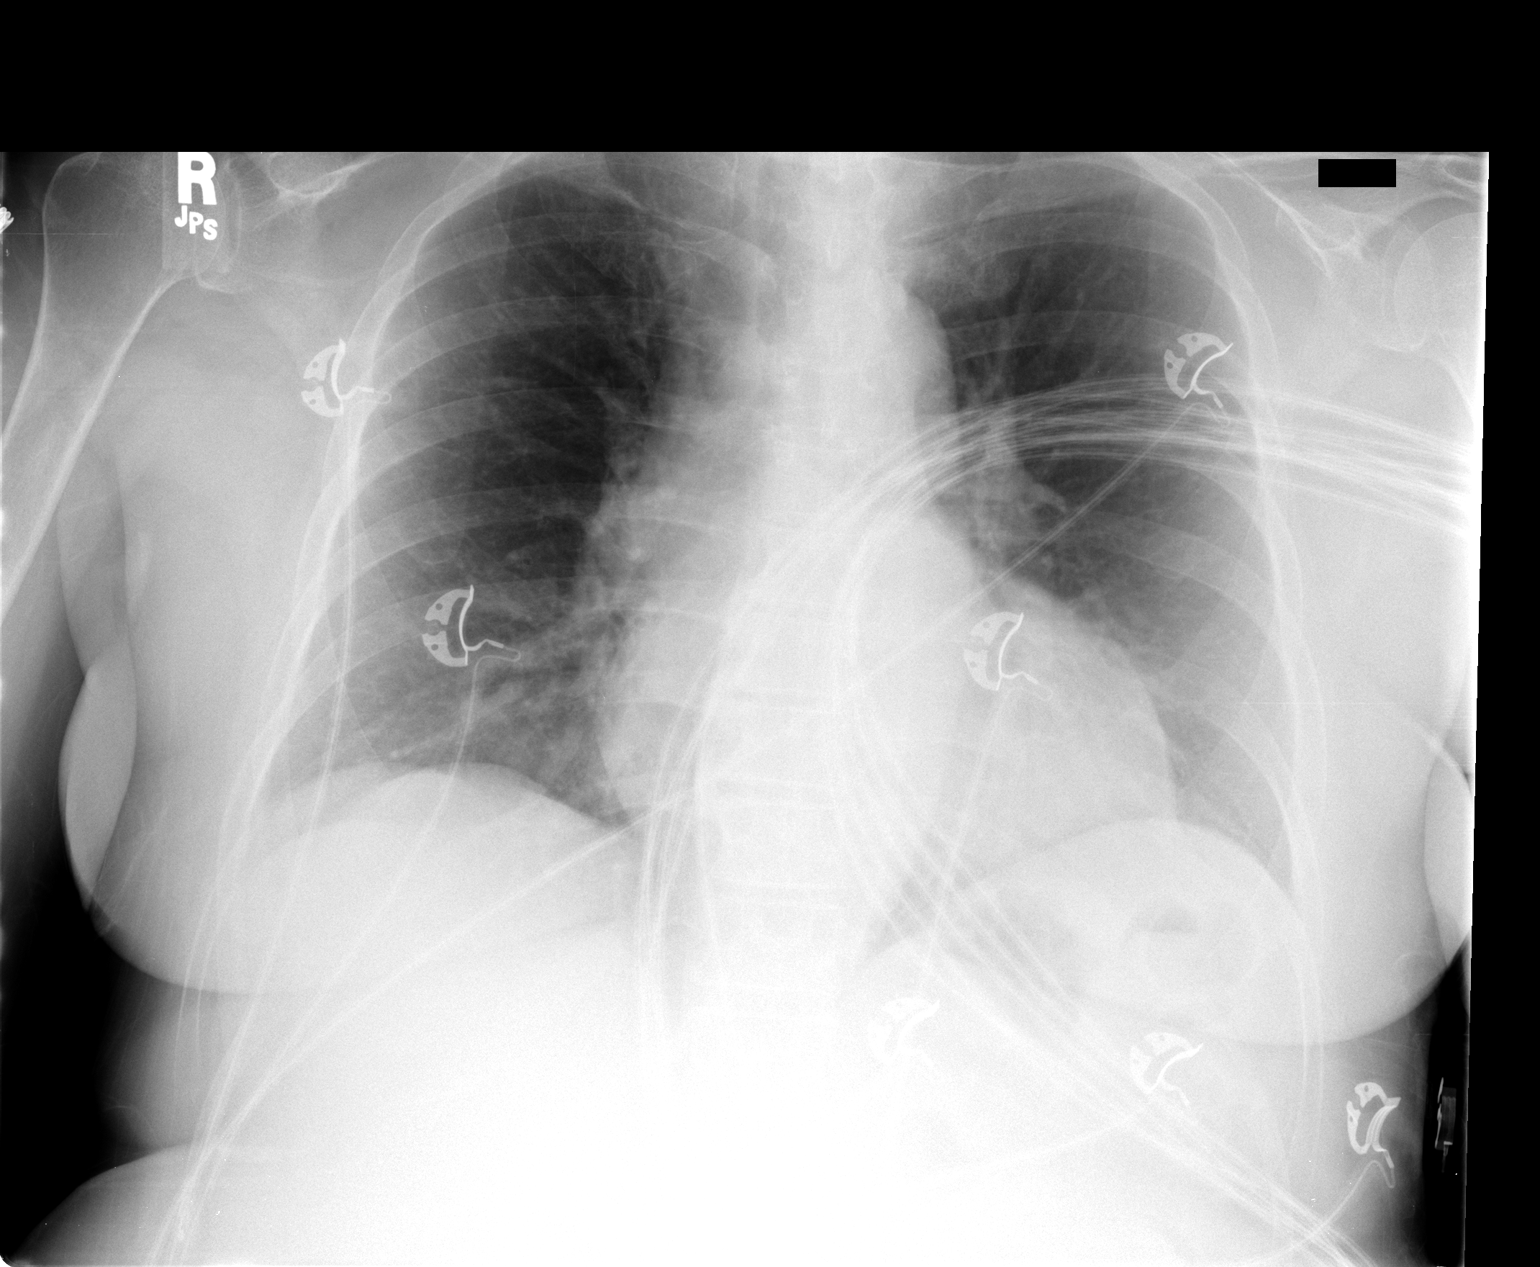

[1 of 1 positions shown; findings below may reference images not displayed]

FINDINGS: 2832 hours.  The heart size and mediastinal contours are
stable for portable technique.  There is improved aeration of the
right middle lobe and lingula.  No progressive airspace disease,
edema or pleural effusion is demonstrated.  Telemetry leads overlie
the chest.
IMPRESSION: Improved lingular and right middle lobe atelectasis.  No acute
findings demonstrated.

## 2011-01-28 ENCOUNTER — Ambulatory Visit (INDEPENDENT_AMBULATORY_CARE_PROVIDER_SITE_OTHER): Payer: Medicare Other | Admitting: Family Medicine

## 2011-01-28 ENCOUNTER — Encounter: Payer: Self-pay | Admitting: Family Medicine

## 2011-01-28 VITALS — BP 124/68 | HR 90 | Temp 98.6°F | Wt 172.0 lb

## 2011-01-28 DIAGNOSIS — K2990 Gastroduodenitis, unspecified, without bleeding: Secondary | ICD-10-CM

## 2011-01-28 DIAGNOSIS — R51 Headache: Secondary | ICD-10-CM

## 2011-01-28 DIAGNOSIS — K219 Gastro-esophageal reflux disease without esophagitis: Secondary | ICD-10-CM

## 2011-01-28 DIAGNOSIS — N390 Urinary tract infection, site not specified: Secondary | ICD-10-CM

## 2011-01-28 DIAGNOSIS — K2970 Gastritis, unspecified, without bleeding: Secondary | ICD-10-CM

## 2011-01-28 LAB — POCT URINALYSIS DIPSTICK
Glucose, UA: NEGATIVE
Leukocytes, UA: NEGATIVE
Nitrite, UA: NEGATIVE
Urobilinogen, UA: 0.2

## 2011-01-28 MED ORDER — SUCRALFATE 1 G PO TABS: 1.0000 g | ORAL_TABLET | Freq: Four times a day (QID) | ORAL | Status: DC

## 2011-01-29 ENCOUNTER — Telehealth: Payer: Self-pay | Admitting: Family Medicine

## 2011-01-29 NOTE — Telephone Encounter (Signed)
Pt is having trouble swallowing her sucralfate pill. Can she cut in 1/2? Please call and LMOM with the answer if she does not answer.

## 2011-01-29 NOTE — Telephone Encounter (Signed)
Called pt to make aware that per Dr. Scotty Court it is ok to cut sucralfate pill in half.

## 2011-01-30 ENCOUNTER — Encounter: Payer: Self-pay | Admitting: Family Medicine

## 2011-01-30 NOTE — Progress Notes (Signed)
  Subjective:    Patient ID: Misty Meyer, female    DOB: 10/19/1940, 70 y.o.   MRN: [id number] This 70 year old single female with history of grand mal seizures and seen an urologist for treatment is in today complaining of dark yellow urine with possible blood or urinary tract infection however having no urinary symptoms. Mediate check a urine reveals very concentrated urine with no abnormalities other complaint is that of headaches from the back of her neck into her head bilaterally ands he is concerned headaches have something to do with her seizures.. She has been underr considerable stress over the last 4-6  headaches are described as tension headaches She also is concerned that she has upper abdominal tenderness and pain and concern as to whether this related to her esophageal reflux also has a burning type pain in the lateral epigastrium Systems review negative otherwise HPI    Review of Systems see history of present illness     Objective:   Physical Exam the patient is a well-built well-nourished pleasant white female in no distress but complained of headaches and upper abdominal pain HEENT reveals some muscle spasm over the cervical spine extend to the scalp otherwise years eyes nose and throat negative Abdominal examination reveals tenderness in the epigastric to left upper bumble region fracture bowel sounds liver spleen kidneys are nonpalpable no masses felt        Assessment & Plan:  Tension headaches 2 treat with Fioricet Gastritis treat with Carafate 1 g 4 times a day GERD severe increase omeprazole 40 mg twice a day Urinalysis exam reveals some treated urine other abnormalities Grand mal seizures continue to see Dr. Anne Hahn

## 2011-01-30 NOTE — Patient Instructions (Signed)
Urinalysis revealed very concentrated urine and recommend increase fluid intake there is no evidence of blood nor infection in your urine You're describing and one final physical is that of easy gastritis or an early ulcer. We have started the Carafate and I feel sure this will help 4 tension headaches I have prescribed Fioricet or 2 every 4 hours when necessary for pain or headache Call if things do not go well

## 2011-01-31 NOTE — Progress Notes (Signed)
Quick Note:  Pt aware at office visit. ______

## 2011-02-03 ENCOUNTER — Encounter: Payer: Self-pay | Admitting: Internal Medicine

## 2011-02-09 ENCOUNTER — Telehealth: Payer: Self-pay | Admitting: Family Medicine

## 2011-02-09 NOTE — Telephone Encounter (Signed)
Needs new rx for Omeprazole 40mg  bid. Please send to [facility] [address]. Thanks.

## 2011-02-09 NOTE — Telephone Encounter (Signed)
Rx called in to pharmacy. 

## 2011-02-10 ENCOUNTER — Telehealth: Payer: Self-pay | Admitting: Internal Medicine

## 2011-02-10 ENCOUNTER — Telehealth: Payer: Self-pay | Admitting: Cardiology

## 2011-02-10 NOTE — Telephone Encounter (Signed)
Pt is having abdominal pain, Dr. [name] requesting pt be seen sooner than 1st available with Dr. Marina Goodell. Pt scheduled to see Amy Esterwood PA 02/12/11@9am . Dr. Marina Goodell is the supervising physician that day. Kristine to notify pt of appt date and time.

## 2011-02-10 NOTE — Telephone Encounter (Signed)
Pt having a lot of pain and needs gallbladder out, can't get an appt until October 10th, Dr.Perry [phone], pls call pt

## 2011-02-10 NOTE — Telephone Encounter (Signed)
SPOKE WITH  DR PERRY'S NURSE PT  WILL SEE AMY ESTERWOOD NP ON 02/12/11 AT 9:00 AM   DR PERRY WILL BE IN OFFICE THAT MORNING AS WELL  PT AWARE OF APPT .Zack Seal

## 2011-02-10 NOTE — Telephone Encounter (Signed)
PT NOT FEELING WAS TOLD HAD GALL STONES YEARS AGO   HAS ACID REFLUX  AND DR STAFFORD  TOLD  PT SHE HAD ULCER WOULD LIKE TO  SEE DR PERRY BEFORE 03/11/11  CALL PLACED TO GI AWAITING RETURN CALL FROM [facility]  TO RESHEDULE APPT .Misty Meyer

## 2011-02-12 ENCOUNTER — Encounter: Payer: Self-pay | Admitting: Physician Assistant

## 2011-02-12 ENCOUNTER — Ambulatory Visit (INDEPENDENT_AMBULATORY_CARE_PROVIDER_SITE_OTHER): Payer: Medicare Other | Admitting: Physician Assistant

## 2011-02-12 ENCOUNTER — Other Ambulatory Visit (INDEPENDENT_AMBULATORY_CARE_PROVIDER_SITE_OTHER): Payer: Medicare Other

## 2011-02-12 DIAGNOSIS — R110 Nausea: Secondary | ICD-10-CM

## 2011-02-12 DIAGNOSIS — R1013 Epigastric pain: Secondary | ICD-10-CM

## 2011-02-12 DIAGNOSIS — K819 Cholecystitis, unspecified: Secondary | ICD-10-CM

## 2011-02-12 LAB — CBC WITH DIFFERENTIAL/PLATELET
Basophils Relative: 0.4 % (ref 0.0–3.0)
Eosinophils Relative: 0 % (ref 0.0–5.0)
Lymphocytes Relative: 31.2 % (ref 12.0–46.0)
Neutrophils Relative %: 61.2 % (ref 43.0–77.0)
RBC: 3.84 Mil/uL — ABNORMAL LOW (ref 3.87–5.11)
WBC: 8 10*3/uL (ref 4.5–10.5)

## 2011-02-12 LAB — HEPATIC FUNCTION PANEL
ALT: 34 U/L (ref 0–35)
AST: 25 U/L (ref 0–37)
Bilirubin, Direct: 0.1 mg/dL (ref 0.0–0.3)
Total Bilirubin: 0.2 mg/dL — ABNORMAL LOW (ref 0.3–1.2)

## 2011-02-12 NOTE — Progress Notes (Signed)
Subjective:    Patient ID: Misty Meyer, female    DOB: 07/04/1940, 70 y.o.   MRN: [id number]  HPI Misty Meyer is a 70 year old female known to Dr. Marina Goodell from prior screening colonoscopy done in January of 2011. This was pertinent for adenomatous colon polyps and a mild sigmoid diverticulosis. Followup was suggested at 3 years.  Patient is referred today per Dr. Scotty Court with complaints of "feeling blah" over the past few weeks. She describes an "attack" about 3 weeks ago with upper abdominal pain and indigestion which seemed to radiate to her back. Around that same time she also noted dark urine which she thought was blood. Since then she has been feeling some tenderness in her upper abdomen intermittent queasiness and nausea but no further episodes of pain. She says she really just does not feel good. She's been unaware of any fever or chills, her bowels have been fairly regular for her without melena or hematochezia. She is a somewhat difficult historian.  The patient did have an upper abdominal ultrasound done in June of 2010 did show multiple gallstones but no gallbladder wall thickening or ductal dilation. She had had some cardiac issues around that same time and decision was not made to pursue cholecystectomy. At this time she believes that her gallbladder needs to be removed.  She has been taking Voltaren regularly for the past couple of years and does take a PPI regularly as well. She was given a trial of Carafate per Dr. Scotty Court but has not noticed any difference with this.  Urinalysis done 01/28/2011 was negative for infection bilirubin was positive, she did not have other labs done that day.    Review of Systems  Constitutional: Positive for appetite change and fatigue.  HENT: Negative.   Eyes: Negative.   Respiratory: Negative.   Cardiovascular: Negative.   Gastrointestinal: Positive for nausea and abdominal pain.  Genitourinary: Negative.   Musculoskeletal: Positive for back  pain.  Skin: Negative.   Neurological: Positive for seizures.  Hematological: Negative.   Psychiatric/Behavioral: Negative.    Outpatient Prescriptions Prior to Visit  Medication Sig Dispense Refill  . acetaminophen (TYLENOL) 500 MG tablet Take 500 mg by mouth every 6 (six) hours as needed.        Marland Kitchen albuterol (PROAIR HFA) 108 (90 BASE) MCG/ACT inhaler Inhale 2 puffs into the lungs every 4 (four) hours as needed for wheezing.  1 Inhaler  3  . Ascorbic Acid (VITAMIN C) 500 MG tablet Take 500 mg by mouth daily.        Marland Kitchen aspirin 325 MG tablet Take 325 mg by mouth daily.        . beclomethasone (QVAR) 40 MCG/ACT inhaler Inhale 2 puffs into the lungs 2 (two) times daily.        . calcium carbonate (OS-CAL) 600 MG TABS Take 600 mg by mouth 3 (three) times daily with meals.        . CRESTOR 20 MG tablet TAKE 1 TABLET BY MOUTH EVERY DAY  30 tablet  5  . cyclobenzaprine (FLEXERIL) 10 MG tablet Take 1 tablet (10 mg total) by mouth 3 (three) times daily as needed.  90 tablet  3  . diclofenac (VOLTAREN) 75 MG EC tablet Take 75 mg by mouth 2 (two) times daily.        Marland Kitchen dronedarone (MULTAQ) 400 MG tablet Take 1 tablet (400 mg total) by mouth 2 (two) times daily with a meal.  60 tablet  11  . fluticasone (FLONASE)  50 MCG/ACT nasal spray 2 sprays by Nasal route daily.        Marland Kitchen ibandronate (BONIVA) 150 MG tablet Take 150 mg by mouth every 30 (thirty) days. Take in the morning with a full glass of water, on an empty stomach, and do not take anything else by mouth or lie down for the next 30 min.       . levETIRAcetam (KEPPRA) 500 MG tablet Take 1 tablet (500 mg total) by mouth 2 (two) times daily.  60 tablet  1  . loperamide (IMODIUM A-D) 2 MG tablet Take 2 mg by mouth as needed.        . metoprolol (TOPROL-XL) 50 MG 24 hr tablet TAKE 1 TABLET BY MOUTH EVERY DAY  30 tablet  5  . Multiple Vitamin (MULTIVITAMIN) tablet Take 1 tablet by mouth daily.        . nitroGLYCERIN (NITROSTAT) 0.4 MG SL tablet Place 0.4 mg  under the tongue every 5 (five) minutes as needed.        Marland Kitchen omeprazole (PRILOSEC) 40 MG capsule Take 1 capsule (40 mg total) by mouth daily.  30 capsule  11  . PHENObarbital (LUMINAL) 60 MG tablet Take 1 tablet (60 mg total) by mouth 2 (two) times daily.  60 tablet  5  . potassium chloride (KLOR-CON) 10 MEQ CR tablet Take 1 tablet (10 mEq total) by mouth daily.  30 tablet  11  . sodium chloride (OCEAN) 0.65 % nasal spray 3 sprays by Nasal route 3 (three) times daily.       . sucralfate (CARAFATE) 1 G tablet Take 1 tablet (1 g total) by mouth 4 (four) times daily. Before meals and bedtime  120 tablet  11   Allergies  Allergen Reactions  . Codeine   . Penicillins   . Triple Antibiotic     REACTION: rash  . Latex Rash   Patient Active Problem List  Diagnoses  . THYROID NODULE  . HYPOTHYROIDISM  . HYPERLIPIDEMIA  . ANEMIA  . DEPRESSION, ACUTE  . 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 TINNITUS, RIGHT  . CAD  . ATRIAL FIBRILLATION  . Extrinsic asthma, unspecified  . LUNG NODULE  . GERD  . IRRITABLE BOWEL SYNDROME  . CHOLELITHIASIS  . RENAL INSUFFICIENCY  . 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 BURSITIS, ACROMIOCLAVICULAR, RIGHT  . MUSCLE SPASM  . COSTOCHONDRITIS, LEFT  . OSTEOPENIA  . SEIZURE DISORDER  . TREMOR  . EDEMA- LOCALIZED  . Seizure disorder        Objective:   Physical Exam Well-developed white female in no acute distress, alert oriented x3 HEENT non-hematocrit normocephalic EOMI PERRLA sclera anicteri,c Neck; supple no JVD, Cardiovascular; regular rate and rhythm with S1-S2 no murmur or gallop, Pulmonary; clear bilaterally, Abdomen; soft, tender in the epigastrium and right upper quadrant there is no guarding or rebound no palpable mass or hepatosplenomegaly, bowel sounds active, Rectal; not done, Extremities; no clubbing cyanosis or edema skin warm and dry no icterus, Psych; mood and affect appropriate.       Assessment & Plan:  #46  70 year old female with 3-4 week history of malaise and intermittent nausea, and one distinct episode of epigastric pain followed by dark urine. Patient has documented cholelithiasis. Rule out symptomatic cholelithiasis, chronic cholecystitis and choledocholithiasis.  Plan; CBC ,CMET, lipase today Repeat upper abdominal ultrasound as the last was done 2 years ago; will assess her for her wall thickening and ductal dilation fine  Continue  twice-daily Prilosec Advised low-fat bland diet I will arrange for surgical consultation to consider laparoscopic cholecystectomy  #2 Adenomatous colon polyps Due for followup January 2014  #3 diverticulosis  #4 Coronary artery disease  #5 History of atrial fibrillation  #6 seizure disorder Advised to discontinue Voltaren

## 2011-02-12 NOTE — Progress Notes (Signed)
Agree with initial assessment and plans 

## 2011-02-12 NOTE — Patient Instructions (Signed)
Please go to the basement level to have your labs drawn.  We have scheduled you for an abdominal ultrasound. Directions provided.

## 2011-02-16 ENCOUNTER — Telehealth: Payer: Self-pay | Admitting: *Deleted

## 2011-02-16 ENCOUNTER — Telehealth: Payer: Self-pay | Admitting: Family Medicine

## 2011-02-16 MED ORDER — OMEPRAZOLE 40 MG PO CPDR: 40.0000 mg | DELAYED_RELEASE_CAPSULE | Freq: Two times a day (BID) | ORAL | Status: DC

## 2011-02-16 NOTE — Telephone Encounter (Signed)
Patient notified of results as per Amy Esterwood, PA 

## 2011-02-16 NOTE — Telephone Encounter (Signed)
According to Dr. Charmian Muff last office visit notes; this is correct; a new prescription has been sent in to the pharmacy.

## 2011-02-16 NOTE — Telephone Encounter (Signed)
Pt said that Dr Scotty Court told pt to take 2 of the omeprazole (PRILOSEC) 40 MG capsule per day. Pt is needing a new script called in to [facility] on [phone] because original instructions were to take 1 a day. Pt completely out of medicine.

## 2011-02-16 NOTE — Telephone Encounter (Signed)
Message copied by Daphine Deutscher on Mon Feb 16, 2011 11:13 AM ------      Message from: [employer], [place] S      Created: Mon Feb 16, 2011 11:12 AM       Please let Fleta know her labs are normal

## 2011-02-17 ENCOUNTER — Ambulatory Visit (HOSPITAL_COMMUNITY)
Admission: RE | Admit: 2011-02-17 | Discharge: 2011-02-17 | Disposition: A | Discharge status: Home / Self Care | Payer: Medicare Other | Source: Ambulatory Visit | Attending: Physician Assistant | Admitting: Physician Assistant

## 2011-02-17 DIAGNOSIS — K219 Gastro-esophageal reflux disease without esophagitis: Secondary | ICD-10-CM | POA: Insufficient documentation

## 2011-02-17 DIAGNOSIS — Z90710 Acquired absence of both cervix and uterus: Secondary | ICD-10-CM | POA: Insufficient documentation

## 2011-02-17 DIAGNOSIS — K819 Cholecystitis, unspecified: Secondary | ICD-10-CM

## 2011-02-17 DIAGNOSIS — E119 Type 2 diabetes mellitus without complications: Secondary | ICD-10-CM | POA: Insufficient documentation

## 2011-02-17 DIAGNOSIS — I10 Essential (primary) hypertension: Secondary | ICD-10-CM | POA: Insufficient documentation

## 2011-02-17 DIAGNOSIS — R110 Nausea: Secondary | ICD-10-CM

## 2011-02-17 DIAGNOSIS — R1013 Epigastric pain: Secondary | ICD-10-CM | POA: Insufficient documentation

## 2011-02-17 DIAGNOSIS — K8020 Calculus of gallbladder without cholecystitis without obstruction: Secondary | ICD-10-CM | POA: Insufficient documentation

## 2011-02-19 ENCOUNTER — Encounter (INDEPENDENT_AMBULATORY_CARE_PROVIDER_SITE_OTHER): Payer: Self-pay | Admitting: Surgery

## 2011-02-23 ENCOUNTER — Ambulatory Visit (INDEPENDENT_AMBULATORY_CARE_PROVIDER_SITE_OTHER): Payer: Medicare Other | Admitting: Surgery

## 2011-02-23 ENCOUNTER — Encounter (INDEPENDENT_AMBULATORY_CARE_PROVIDER_SITE_OTHER): Payer: Self-pay | Admitting: Surgery

## 2011-02-23 ENCOUNTER — Other Ambulatory Visit: Payer: Self-pay | Admitting: Critical Care Medicine

## 2011-02-23 VITALS — BP 118/66 | HR 68 | Temp 97.2°F | Resp 16 | Ht 63.0 in | Wt 167.0 lb

## 2011-02-23 DIAGNOSIS — K8020 Calculus of gallbladder without cholecystitis without obstruction: Secondary | ICD-10-CM

## 2011-02-23 NOTE — Progress Notes (Signed)
Chief Complaint  Patient presents with  . Other    Eval gallbladder    HPI Misty Meyer is a 70 y.o. female HPIPatient referred by Dr. Scotty Court for evaluation of symptomatic cholelithiasis. She has been seen here in the past for similar problems. At that time it was felt that her discomfort was more of a cardiac nature. Now however she does have epigastric abdominal pain her into the right upper quadrant after fatty meals. This has been occurring over the past several weeks. She denies diarrhea. She has had some nausea but no emesis.  Past Medical History  Diagnosis Date  . CAD (coronary artery disease)   . Chest pain, unspecified   . HLD (hyperlipidemia)   . Impacted cerumen   . Calculus of gallbladder without mention of cholecystitis or obstruction   . IBS (irritable bowel syndrome)   . Abdominal pain, unspecified site   . GERD (gastroesophageal reflux disease)   . Tinnitus   . Seizure disorder     Grand mal seizure 7/12, [facility]  . Unspecified hypothyroidism   . Anemia   . Unspecified adverse effect of other drug, medicinal and biological substance   . Urinary tract infection, site not specified   . Allergic rhinitis, cause unspecified   . Edema   . Spasm of muscle   . Major depressive disorder, single episode, severe, without mention of psychotic behavior   . Arthritis of back   . 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 Osteopenia   . Atrial fibrillation   . Arthritis   . Asthma   . Osteoporosis   . Seizures   . Constipation   . Diarrhea   . Sore throat   . Trouble swallowing   . Chills   . Weakness   . Abdominal distension     Past Surgical History  Procedure Date  . Total abdominal hysterectomy   . Wrist fracture surgery     with plates (left)  . Tonsillectomy     age 42    Family History  Problem Relation Age of Onset  . Angina Mother   . Alzheimer's disease Mother   . Heart attack Father   . Heart failure Father   . Heart disease Father   . Heart  disease Brother   . Breast cancer Maternal Aunt   . Cancer Maternal Aunt     breast  . Cervical cancer Maternal Aunt   . Cancer Maternal Aunt     ovarian  . Colon cancer Neg Hx     Social History History  Substance Use Topics  . Smoking status: Never Smoker   . Smokeless tobacco: Never Used  . Alcohol Use: No    Allergies  Allergen Reactions  . Codeine Other (See Comments)    Caused possible hallucinations and anxiety  . Latex Rash  . Penicillins Swelling    Lips  . Triple Antibiotic Itching and Rash    Current Outpatient Prescriptions  Medication Sig Dispense Refill  . acetaminophen (TYLENOL) 500 MG tablet Take 500 mg by mouth every 6 (six) hours as needed.        Marland Kitchen albuterol (PROAIR HFA) 108 (90 BASE) MCG/ACT inhaler Inhale 2 puffs into the lungs every 4 (four) hours as needed for wheezing.  1 Inhaler  3  . Ascorbic Acid (VITAMIN C) 500 MG tablet Take 500 mg by mouth daily.        Marland Kitchen aspirin 325 MG tablet Take 325 mg by mouth  daily.        . beclomethasone (QVAR) 40 MCG/ACT inhaler Inhale 2 puffs into the lungs 2 (two) times daily.        . calcium carbonate (OS-CAL) 600 MG TABS Take 600 mg by mouth 3 (three) times daily with meals.        . CRESTOR 20 MG tablet TAKE 1 TABLET BY MOUTH EVERY DAY  30 tablet  5  . cyclobenzaprine (FLEXERIL) 10 MG tablet Take 1 tablet (10 mg total) by mouth 3 (three) times daily as needed.  90 tablet  3  . diclofenac (VOLTAREN) 75 MG EC tablet Take 75 mg by mouth 2 (two) times daily.        Marland Kitchen dronedarone (MULTAQ) 400 MG tablet Take 1 tablet (400 mg total) by mouth 2 (two) times daily with a meal.  60 tablet  11  . ferrous sulfate 325 (65 FE) MG tablet Take 325 mg by mouth 2 (two) times daily.        . fluticasone (FLONASE) 50 MCG/ACT nasal spray 2 sprays by Nasal route daily.        Marland Kitchen ibandronate (BONIVA) 150 MG tablet Take 150 mg by mouth every 30 (thirty) days. Take in the morning with a full glass of water, on an empty stomach, and do not  take anything else by mouth or lie down for the next 30 min.       . levETIRAcetam (KEPPRA) 500 MG tablet Take 1 tablet (500 mg total) by mouth 2 (two) times daily.  60 tablet  1  . loperamide (IMODIUM A-D) 2 MG tablet Take 2 mg by mouth as needed.        . metoprolol (TOPROL-XL) 50 MG 24 hr tablet TAKE 1 TABLET BY MOUTH EVERY DAY  30 tablet  5  . Multiple Vitamin (MULTIVITAMIN) tablet Take 1 tablet by mouth daily.        . nitroGLYCERIN (NITROSTAT) 0.4 MG SL tablet Place 0.4 mg under the tongue every 5 (five) minutes as needed.        Marland Kitchen omeprazole (PRILOSEC) 40 MG capsule Take 1 capsule (40 mg total) by mouth 2 (two) times daily.  60 capsule  11  . PHENObarbital (LUMINAL) 60 MG tablet Take 1 tablet (60 mg total) by mouth 2 (two) times daily.  60 tablet  5  . potassium chloride (KLOR-CON) 10 MEQ CR tablet Take 1 tablet (10 mEq total) by mouth daily.  30 tablet  11  . sodium chloride (OCEAN) 0.65 % nasal spray 3 sprays by Nasal route 3 (three) times daily.       . sucralfate (CARAFATE) 1 G tablet Take 1 tablet (1 g total) by mouth 4 (four) times daily. Before meals and bedtime  120 tablet  11    Review of Systems Review of Systems The extensive review of systems is reviewed with the patient. She does have a failed and atypical chest pain. Blood pressure 118/66, pulse 68, temperature 97.2 F (36.2 C), temperature source Temporal, resp. rate 16, height 5\' 3"  (1.6 m), weight 167 lb (75.751 kg).  Physical Exam Physical Exam  Constitutional: She is oriented to person, place, and time. She appears well-nourished. No distress.  HENT:  Head: Normocephalic and atraumatic.  Left Ear: External ear normal.  Nose: Nose normal.  Mouth/Throat: Oropharynx is clear and moist. No oropharyngeal exudate.  Eyes: Conjunctivae and EOM are normal. Pupils are equal, round, and reactive to light. Left eye exhibits no discharge. No scleral  icterus.  Neck: Normal range of motion. Neck supple. No tracheal deviation  present. No thyromegaly present.  Cardiovascular: Normal rate, regular rhythm and intact distal pulses.   Murmur heard. Pulmonary/Chest: Breath sounds normal. No respiratory distress. She has no wheezes.  Abdominal: Soft. Bowel sounds are normal. She exhibits no distension. There is tenderness. There is no rebound and no guarding.  Musculoskeletal: Normal range of motion. She exhibits no edema and no tenderness.  Lymphadenopathy:    She has no cervical adenopathy.  Neurological: She is alert and oriented to person, place, and time.  Skin: Skin is warm and dry. No rash noted. No erythema.  Psychiatric: Her behavior is normal. Judgment and thought content normal.    Data Reviewed I have reviewed the patient's ultrasound showed her to have cholelithiasis. There is no gallbladder wall thickening in the bile duct is normal. Her liver function tests are also normal.  Assessment    Patient with symptomatic cholelithiasis as well as other comorbidities including atrial fibrillation and atypical chest pain    Plan    Laparoscopic cholecystectomy with possible cholangiogram is recommended. I discussed this with her in detail including the risks of bleeding, infection, bile duct injury, bile leak, injury to other structures, the testis may not resolve all her symptoms, cardiopulmonary issues, et Karie Soda. She will be seeing Dr. Tedra Senegal on October 15 and we will get cardiac clearance for laparoscopic cholecystectomy at that time. Will then arrange her surgery after then       [facility] 02/23/2011, 4:06 PM

## 2011-02-26 ENCOUNTER — Telehealth: Payer: Self-pay | Admitting: Cardiology

## 2011-02-26 NOTE — Telephone Encounter (Signed)
Left message for pt to call back  °

## 2011-02-26 NOTE — Telephone Encounter (Signed)
I spoke with the pt and made her aware that Dr [name] is out of the office until 03/11/11.  I made her aware that if she needs surgical clearance then she can see Tereso Newcomer PA-C.  At this time there is an open slot tomorrow. The pt said she has to look at her schedule and she will call back to make an appt with Scott.

## 2011-02-26 NOTE — Telephone Encounter (Signed)
Pt is calling Misty Meyer is having gall bladder surgery. Misty Meyer wants to get in to see dr [name] sooner than 1015. Misty Meyer has been having gall bladder attacks.

## 2011-02-26 NOTE — Telephone Encounter (Signed)
PT calling wanting to see if she can come in sooner to see Dr. [name]. Please return pt call to discuss further.

## 2011-02-27 ENCOUNTER — Ambulatory Visit (INDEPENDENT_AMBULATORY_CARE_PROVIDER_SITE_OTHER): Payer: Medicare Other | Admitting: Physician Assistant

## 2011-02-27 ENCOUNTER — Encounter: Payer: Self-pay | Admitting: Physician Assistant

## 2011-02-27 VITALS — BP 108/60 | HR 78 | Ht 63.0 in | Wt 166.0 lb

## 2011-02-27 DIAGNOSIS — Z01810 Encounter for preprocedural cardiovascular examination: Secondary | ICD-10-CM

## 2011-02-27 DIAGNOSIS — I4891 Unspecified atrial fibrillation: Secondary | ICD-10-CM

## 2011-02-27 DIAGNOSIS — I2510 Atherosclerotic heart disease of native coronary artery without angina pectoris: Secondary | ICD-10-CM

## 2011-02-27 DIAGNOSIS — K8020 Calculus of gallbladder without cholecystitis without obstruction: Secondary | ICD-10-CM

## 2011-02-27 DIAGNOSIS — G40909 Epilepsy, unspecified, not intractable, without status epilepticus: Secondary | ICD-10-CM

## 2011-02-27 NOTE — Progress Notes (Signed)
History of Present Illness: Primary Cardiologist:  Dr.  Shawnie Pons   Misty Meyer is a 70 y.o. female presents for surgical clearance.  She has a history of nonobstructive CAD, cardiac catheterization 8/10: Vigorous LV function, ostial LAD 50%, paroxysmal atrial fibrillation prompting admissions to the hospital 2/11, 10/11 and 1/12.  She was placed on Multaq after presentation to the hospital 1/12.  She has been maintained on aspirin only.  She has remained in sinus rhythm since that time.  Other history includes seizure disorder, hyperlipidemia, GERD, multinodular goiter, lung nodule and asthma.  She had a grand mal seizure in the summer and is now on Keppra in addition to Phenobarbital.    She has symptomatic cholelithiasis.  She is to have a lap cholecystectomy with Dr. Magnus Ivan in the near future.  She denies chest pain or dyspnea.  Does note fatigue.  No palpitations.  No orthopnea or PND.  Has chronic edema without changes.  No syncope.  She can vacuum a room or walk up a flight of steps without chest pain or dyspnea.  Past Medical History  Diagnosis Date  . CAD (coronary artery disease)   . Chest pain, unspecified   . HLD (hyperlipidemia)   . Impacted cerumen   . Calculus of gallbladder without mention of cholecystitis or obstruction   . IBS (irritable bowel syndrome)   . Abdominal pain, unspecified site   . GERD (gastroesophageal reflux disease)   . Tinnitus   . Seizure disorder     Grand mal seizure 7/12, [facility]  . Unspecified hypothyroidism   . Anemia   . Unspecified adverse effect of other drug, medicinal and biological substance   . Urinary tract infection, site not specified   . Allergic rhinitis, cause unspecified   . Edema   . Spasm of muscle   . Major depressive disorder, single episode, severe, without mention of psychotic behavior   . Arthritis of back   . 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 Osteopenia   . Atrial fibrillation   . Arthritis   . Asthma   .  Osteoporosis   . Seizures   . Constipation   . Diarrhea   . Sore throat   . Trouble swallowing   . Chills   . Weakness   . Abdominal distension     Current Outpatient Prescriptions  Medication Sig Dispense Refill  . acetaminophen (TYLENOL) 500 MG tablet Take 500 mg by mouth every 6 (six) hours as needed.        Marland Kitchen albuterol (PROAIR HFA) 108 (90 BASE) MCG/ACT inhaler Inhale 2 puffs into the lungs every 4 (four) hours as needed for wheezing.  1 Inhaler  3  . Ascorbic Acid (VITAMIN C) 500 MG tablet Take 500 mg by mouth daily.        Marland Kitchen aspirin 325 MG tablet Take 325 mg by mouth daily.        . calcium carbonate (OS-CAL) 600 MG TABS Take 600 mg by mouth 3 (three) times daily with meals.        . CRESTOR 20 MG tablet TAKE 1 TABLET BY MOUTH EVERY DAY  30 tablet  5  . cyclobenzaprine (FLEXERIL) 10 MG tablet Take 1 tablet (10 mg total) by mouth 3 (three) times daily as needed.  90 tablet  3  . diclofenac (VOLTAREN) 75 MG EC tablet Take 75 mg by mouth 2 (two) times daily.        Marland Kitchen dronedarone (MULTAQ) 400 MG  tablet Take 1 tablet (400 mg total) by mouth 2 (two) times daily with a meal.  60 tablet  11  . ferrous sulfate 325 (65 FE) MG tablet Take 325 mg by mouth 2 (two) times daily.        Marland Kitchen ibandronate (BONIVA) 150 MG tablet Take 150 mg by mouth every 30 (thirty) days. Take in the morning with a full glass of water, on an empty stomach, and do not take anything else by mouth or lie down for the next 30 min.       . levETIRAcetam (KEPPRA) 500 MG tablet Take 1 tablet (500 mg total) by mouth 2 (two) times daily.  60 tablet  1  . loperamide (IMODIUM A-D) 2 MG tablet Take 2 mg by mouth as needed.        . metoprolol (TOPROL-XL) 50 MG 24 hr tablet TAKE 1 TABLET BY MOUTH EVERY DAY  30 tablet  5  . Multiple Vitamin (MULTIVITAMIN) tablet Take 1 tablet by mouth daily.        . nitroGLYCERIN (NITROSTAT) 0.4 MG SL tablet Place 0.4 mg under the tongue every 5 (five) minutes as needed.        Marland Kitchen omeprazole  (PRILOSEC) 40 MG capsule Take 1 capsule (40 mg total) by mouth 2 (two) times daily.  60 capsule  11  . PHENObarbital (LUMINAL) 60 MG tablet Take 1 tablet (60 mg total) by mouth 2 (two) times daily.  60 tablet  5  . potassium chloride (KLOR-CON) 10 MEQ CR tablet Take 1 tablet (10 mEq total) by mouth daily.  30 tablet  11  . QVAR 40 MCG/ACT inhaler INHALE 2 PUFFS BY MOUTH TWICE DAILY  1 Inhaler  6  . sodium chloride (OCEAN) 0.65 % nasal spray 3 sprays by Nasal route 3 (three) times daily.       . sucralfate (CARAFATE) 1 G tablet Take 1 tablet (1 g total) by mouth 4 (four) times daily. Before meals and bedtime  120 tablet  11  . fluticasone (FLONASE) 50 MCG/ACT nasal spray 2 sprays by Nasal route daily.          Allergies: Allergies  Allergen Reactions  . Codeine Other (See Comments)    Caused possible hallucinations and anxiety  . Latex Rash  . Penicillins Swelling    Lips  . Triple Antibiotic Itching and Rash    Social Hx:  Non smoker  ROS:  Please see the history of present illness.  She notes chills.  All other systems reviewed and negative.   Vital Signs: BP 108/60  Pulse 78  Ht 5\' 3"  (1.6 m)  Wt 166 lb (75.297 kg)  BMI 29.41 kg/m2  PHYSICAL EXAM: Well nourished, well developed, in no acute distress HEENT: normal Neck: no JVD Endocrine: No thyromegaly Cardiac:  normal S1, S2; RRR; no murmur Lungs:  clear to auscultation bilaterally, no wheezing, rhonchi or rales Abd: soft, nontender, no hepatomegaly Ext: no pitting edema Skin: warm and dry Neuro:  CNs 2-12 intact, no focal abnormalities noted  EKG:  NSR, HR 78, normal axis, NSSTTW changes  ASSESSMENT AND PLAN:

## 2011-02-27 NOTE — Assessment & Plan Note (Signed)
She needs cholecystectomy.

## 2011-02-27 NOTE — Patient Instructions (Signed)
Your physician recommends that you schedule a follow-up appointment in: 05/22/11 @ 12:00 TO SEE DR. [name]

## 2011-02-27 NOTE — Assessment & Plan Note (Signed)
She does not have any unstable cardiac conditions.  She can achieve 4 METS without chest pain or dyspnea.  She is maintaining NSR.   According to [facility] and AHA guidelines, the patient requires no further cardiac workup prior to their noncardiac surgery.  The patient should be at acceptable risk.  Our service is available as necessary in the perioperative period.  She should take her Toprol and Multaq throughout the perioperative period.

## 2011-02-27 NOTE — Assessment & Plan Note (Signed)
Maintaining NSR.  Remain on Multaq to reduce chances of developing AFib perioperatively.  She remains on ASA.

## 2011-02-27 NOTE — Assessment & Plan Note (Signed)
Follow by Dr. Anne Hahn.

## 2011-03-02 DIAGNOSIS — K8020 Calculus of gallbladder without cholecystitis without obstruction: Secondary | ICD-10-CM

## 2011-03-02 HISTORY — DX: Calculus of gallbladder without cholecystitis without obstruction: K80.20

## 2011-03-04 ENCOUNTER — Telehealth (INDEPENDENT_AMBULATORY_CARE_PROVIDER_SITE_OTHER): Payer: Self-pay | Admitting: Surgery

## 2011-03-04 NOTE — Telephone Encounter (Signed)
Pt has a appt with Dr [name] on oct 15 and pt is aware that appt with her heart doctor

## 2011-03-10 ENCOUNTER — Telehealth: Payer: Self-pay | Admitting: Cardiology

## 2011-03-10 NOTE — Telephone Encounter (Signed)
Pt called. She had question about clearance for surgery for gall bladder please call

## 2011-03-10 NOTE — Telephone Encounter (Signed)
This pt was seen by Tereso Newcomer PA-C 02/27/11 for cardiac clearance. Note in system.  I told the pt that she can call Dr Eliberto Ivory office to get surgery scheduled.

## 2011-03-11 ENCOUNTER — Ambulatory Visit: Payer: Medicare Other | Admitting: Internal Medicine

## 2011-03-16 ENCOUNTER — Ambulatory Visit: Payer: Medicare Other | Admitting: Cardiology

## 2011-03-18 ENCOUNTER — Encounter (HOSPITAL_COMMUNITY)
Admission: RE | Admit: 2011-03-18 | Discharge: 2011-03-18 | Disposition: A | Discharge status: Home / Self Care | Payer: Medicare Other | Source: Ambulatory Visit | Attending: Surgery | Admitting: Surgery

## 2011-03-18 LAB — CBC
MCH: 30.4 pg (ref 26.0–34.0)
MCHC: 32.5 g/dL (ref 30.0–36.0)
MCV: 93.5 fL (ref 78.0–100.0)
Platelets: 182 10*3/uL (ref 150–400)
RBC: 3.72 MIL/uL — ABNORMAL LOW (ref 3.87–5.11)
RDW: 12 % (ref 11.5–15.5)

## 2011-03-18 LAB — DIFFERENTIAL
Eosinophils Absolute: 0.2 10*3/uL (ref 0.0–0.7)
Lymphs Abs: 2.3 10*3/uL (ref 0.7–4.0)
Monocytes Relative: 9 % (ref 3–12)
Neutrophils Relative %: 55 % (ref 43–77)

## 2011-03-18 LAB — COMPREHENSIVE METABOLIC PANEL
ALT: 36 U/L — ABNORMAL HIGH (ref 0–35)
AST: 34 U/L (ref 0–37)
CO2: 30 mEq/L (ref 19–32)
Calcium: 9.7 mg/dL (ref 8.4–10.5)
Creatinine, Ser: 1.1 mg/dL (ref 0.50–1.10)
GFR calc non Af Amer: 50 mL/min — ABNORMAL LOW (ref >90)
Sodium: 142 mEq/L (ref 135–145)
Total Protein: 6.6 g/dL (ref 6.0–8.3)

## 2011-03-18 LAB — SURGICAL PCR SCREEN: Staphylococcus aureus: NEGATIVE

## 2011-03-23 ENCOUNTER — Other Ambulatory Visit: Payer: Self-pay | Admitting: Cardiology

## 2011-03-25 ENCOUNTER — Other Ambulatory Visit (INDEPENDENT_AMBULATORY_CARE_PROVIDER_SITE_OTHER): Payer: Self-pay | Admitting: Surgery

## 2011-03-25 ENCOUNTER — Ambulatory Visit (HOSPITAL_COMMUNITY)
Admission: RE | Admit: 2011-03-25 | Discharge: 2011-03-26 | Disposition: A | Discharge status: Home / Self Care | Payer: Medicare Other | Source: Ambulatory Visit | Attending: Surgery | Admitting: Surgery

## 2011-03-25 DIAGNOSIS — Z01812 Encounter for preprocedural laboratory examination: Secondary | ICD-10-CM | POA: Insufficient documentation

## 2011-03-25 DIAGNOSIS — J45909 Unspecified asthma, uncomplicated: Secondary | ICD-10-CM | POA: Insufficient documentation

## 2011-03-25 DIAGNOSIS — I4891 Unspecified atrial fibrillation: Secondary | ICD-10-CM | POA: Insufficient documentation

## 2011-03-25 DIAGNOSIS — K8010 Calculus of gallbladder with chronic cholecystitis without obstruction: Secondary | ICD-10-CM

## 2011-03-25 DIAGNOSIS — K589 Irritable bowel syndrome without diarrhea: Secondary | ICD-10-CM | POA: Insufficient documentation

## 2011-03-25 DIAGNOSIS — K8020 Calculus of gallbladder without cholecystitis without obstruction: Secondary | ICD-10-CM | POA: Insufficient documentation

## 2011-03-25 DIAGNOSIS — F411 Generalized anxiety disorder: Secondary | ICD-10-CM | POA: Insufficient documentation

## 2011-03-25 HISTORY — PX: CHOLECYSTECTOMY: SHX55

## 2011-03-26 NOTE — Op Note (Signed)
[name], [name] NO.:  [id number]  MEDICAL RECORD NO.:  [id number]  LOCATION:                                 FACILITY:  PHYSICIAN:  Abigail Miyamoto, M.D. DATE OF BIRTH:  1940/12/10  DATE OF PROCEDURE:  03/25/2011 DATE OF DISCHARGE:                              OPERATIVE REPORT   PREOPERATIVE DIAGNOSIS:  Symptomatic cholelithiasis.  POSTOPERATIVE DIAGNOSIS:  Symptomatic cholelithiasis.  PROCEDURES:  Laparoscopic cholecystectomy.  SURGEON:  Abigail Miyamoto, MD  ANESTHESIA:  General endotracheal anesthesia and 0.25%Marcaine.  ESTIMATED BLOOD LOSS:  Minimal.  INDICATIONS:  This 70 year old female with symptomatic gallstones, decision was made to proceed to the operating room for cholecystectomy.  FINDINGS:  The patient was found to have a chronically scarred appearing gallbladder full of gallstones.  The cholangiogram could not be performed as the cystic duct was quite small and nearly fibrosed.  PROCEDURE IN DETAIL:  The patient was brought to the operating room, identified as Misty Meyer.  She was placed supine on the operative table.  General anesthesia was induced.  Her abdomen was then prepped and draped in usual sterile fashion.  Using a #15 blade, a small vertical incision was made below the umbilicus, this was carried down to the fascia which was opened with scalpel.  Hemostat was then used to pass to the peritoneal cavity under direct vision.  A 0 Vicryl pursestring suture was placed around the fascial opening.  The Hasson port was placed through the opening and insufflation of the abdomen was begun.  A 5 mm port was then place in the patient's epigastrium and 2 more in the right upper quadrant all under direct vision.  The gallbladder was then grasped and retracted above the liver bed. Dissection was then carried out at the base of the gallbladder.  Several adhesions were taken off the gallbladder first.  The cystic duct was dissected  out and a critical window was achieved around it.  I clipped it once proximally and opened up with laparoscopic scissors.  It was quite tiny and the lumen was daily fibrosed, I therefore clipped it 3 times proximally and completely transected it for going a cholangiogram. The cystic artery was then identified and clipped proximally, distally, and transected as well.  The gallbladder was slowly dissected free from liver bed with the electrocautery.  Once this free from liver bed, it was placed in an endosac and removed through the incision at the umbilicus.  I again examined the liver bed, hemostasis felt to be achieved.  The 0 Vicryl at the umbilicus tied in place closing the fascial defect.  The abdomen was then irrigated with normal saline. Again, hemostasis appeared to be achieved.  All ports were removed under direct vision.  The abdomen was deflated.  All incisions were anesthetized with Marcaine and closed with 4-0 Monocryl subcuticular sutures.  Steri-Strips and Band- Aids were applied.  The patient tolerated procedure well.  All counts correct at the end of procedure.  The patient then extubated and taken in stable condition to recovery room.     Abigail Miyamoto, M.D.     DB/MEDQ  D:  03/25/2011  T:  03/25/2011  Job:  [id number]  Electronically Signed by Abigail Miyamoto M.D. on 03/26/2011 12:36:10 PM

## 2011-04-08 ENCOUNTER — Other Ambulatory Visit: Payer: Self-pay | Admitting: Cardiology

## 2011-04-13 ENCOUNTER — Encounter (INDEPENDENT_AMBULATORY_CARE_PROVIDER_SITE_OTHER): Payer: Self-pay | Admitting: Surgery

## 2011-04-20 ENCOUNTER — Ambulatory Visit (INDEPENDENT_AMBULATORY_CARE_PROVIDER_SITE_OTHER): Payer: Medicare Other | Admitting: Surgery

## 2011-04-20 ENCOUNTER — Encounter (INDEPENDENT_AMBULATORY_CARE_PROVIDER_SITE_OTHER): Payer: Self-pay | Admitting: Surgery

## 2011-04-20 VITALS — BP 149/91 | HR 74 | Temp 97.0°F | Resp 16 | Ht 63.0 in | Wt 165.8 lb

## 2011-04-20 DIAGNOSIS — Z09 Encounter for follow-up examination after completed treatment for conditions other than malignant neoplasm: Secondary | ICD-10-CM

## 2011-04-20 NOTE — Progress Notes (Signed)
Subjective:     Patient ID: Misty Meyer, female   DOB: 1941-05-05, 70 y.o.   MRN: [id number]  HPI She is here for her first postoperative visit status post laparoscopic cholecystectomy. She is doing well has no complaints.  Review of Systems     Objective:   Physical Exam On exam, her incisions are healing well. The final pathology showed chronic cholecystitis with cholelithiasis    Assessment:     Patient doing well status post laparoscopic cholecystectomy    Plan:     She will resume her normal activity and see me back as needed

## 2011-04-21 ENCOUNTER — Ambulatory Visit (INDEPENDENT_AMBULATORY_CARE_PROVIDER_SITE_OTHER): Payer: Medicare Other | Admitting: Internal Medicine

## 2011-04-21 ENCOUNTER — Encounter: Payer: Self-pay | Admitting: Internal Medicine

## 2011-04-21 VITALS — BP 120/68 | HR 84 | Temp 97.9°F | Wt 167.4 lb

## 2011-04-21 DIAGNOSIS — M949 Disorder of cartilage, unspecified: Secondary | ICD-10-CM

## 2011-04-21 DIAGNOSIS — G40401 Other generalized epilepsy and epileptic syndromes, not intractable, with status epilepticus: Secondary | ICD-10-CM

## 2011-04-21 DIAGNOSIS — Z1239 Encounter for other screening for malignant neoplasm of breast: Secondary | ICD-10-CM

## 2011-04-21 DIAGNOSIS — G40909 Epilepsy, unspecified, not intractable, without status epilepticus: Secondary | ICD-10-CM

## 2011-04-21 DIAGNOSIS — Z23 Encounter for immunization: Secondary | ICD-10-CM

## 2011-04-21 DIAGNOSIS — I4891 Unspecified atrial fibrillation: Secondary | ICD-10-CM

## 2011-04-21 DIAGNOSIS — K8020 Calculus of gallbladder without cholecystitis without obstruction: Secondary | ICD-10-CM

## 2011-04-21 DIAGNOSIS — M899 Disorder of bone, unspecified: Secondary | ICD-10-CM

## 2011-04-21 DIAGNOSIS — E785 Hyperlipidemia, unspecified: Secondary | ICD-10-CM

## 2011-04-21 NOTE — Assessment & Plan Note (Signed)
Diagnosis fall 2012 symptomatic biliary colic Status post laparoscopic cholecystectomy March 28, 2011>> symptoms much improved, no residual pain Hospitalization and subsequent postop followup with Dr. Magnus Ivan reviewed

## 2011-04-21 NOTE — Assessment & Plan Note (Signed)
Schedule for DEXA Continue bisphosphonate therapy with Boniva and ongoing calcium plus vitamin D therapy Denies history of axonal fracture and no current or new bone pain

## 2011-04-21 NOTE — Progress Notes (Signed)
Subjective:    Patient ID: Misty Meyer, female    DOB: 10-22-1940, 70 y.o.   MRN: [id number]  HPI  New patient to me but known to our practice - transfer from Dr. Scotty Court who is recently retired Here for followup postop cholecystectomy 03/25/2011  Also reviewed chronic medical issues:  history of nonobstructive CAD, cardiac catheterization 8/10: Vigorous LV function, ostial LAD 50%, paroxysmal atrial fibrillation prompting admissions to the hospital 2/11, 10/11 and 1/12.  She was placed on Multaq after presentation to the hospital 1/12.  She has been maintained on aspirin only.  She has remained in sinus rhythm since that time.  Other history includes seizure disorder, hyperlipidemia, GERD, multinodular goiter, lung nodule and asthma.  She had a grand mal seizure in the summer and is now on Keppra in addition to Phenobarbital.    Past Medical History  Diagnosis Date  . CAD (coronary artery disease)     nonobst cath 12/2008  . HLD (hyperlipidemia)   . Impacted cerumen   . Calculus of gallbladder without mention of cholecystitis or obstruction 03/2011    s/p lap chole  . IBS (irritable bowel syndrome)   . GERD (gastroesophageal reflux disease)   . Tinnitus   . Seizure disorder     Grand mal seizure 7/12, [facility]  . Unspecified hypothyroidism   . Anemia   . Allergic rhinitis, cause unspecified   . Major depressive disorder, single episode, severe, without mention of psychotic behavior   . Arthritis of back   . 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 Osteopenia   . Atrial fibrillation   . Asthma   . Osteoporosis   . Seizures    Family History  Problem Relation Age of Onset  . Angina Mother   . Alzheimer's disease Mother   . Heart attack Father   . Heart failure Father   . Heart disease Father   . Heart disease Brother   . Breast cancer Maternal Aunt   . Cancer Maternal Aunt     breast  . Cervical cancer Maternal Aunt   . Cancer Maternal Aunt     ovarian  . Colon  cancer Neg Hx    History  Substance Use Topics  . Smoking status: Never Smoker   . Smokeless tobacco: Never Used  . Alcohol Use: No     Review of Systems Constitutional: Negative for fever or weight change.  Respiratory: Negative for cough and shortness of breath.   Cardiovascular: Negative for chest pain or palpitations.  Gastrointestinal: Negative for abdominal pain, no bowel changes.  Musculoskeletal: Negative for gait problem or joint swelling.  Skin: Negative for rash.  Neurological: Negative for dizziness or headache.  No other specific complaints in a complete review of systems (except as listed in HPI above).     Objective:   Physical Exam BP 120/68  Pulse 84  Temp(Src) 97.9 F (36.6 C) (Oral)  Wt 167 lb 6.4 oz (75.932 kg)  SpO2 96% Wt Readings from Last 3 Encounters:  04/21/11 167 lb 6.4 oz (75.932 kg)  04/20/11 165 lb 12.8 oz (75.206 kg)  02/27/11 166 lb (75.297 kg)   Constitutional: She appears well-developed and well-nourished. No distress.  Neck: Normal range of motion. Neck supple. No JVD present. No thyromegaly present.  Cardiovascular: Normal rate, regular rhythm and normal heart sounds.  No murmur heard. No BLE edema. Pulmonary/Chest: Effort normal and breath sounds normal. No respiratory distress. She has no wheezes.  Abdominal: Soft. Bowel  sounds are normal. She exhibits no distension. There is no tenderness. no masses Neurological: She is alert and oriented to person, place, and time. No cranial nerve deficit. Coordination normal.   Psychiatric: She has a normal mood and affect. Her behavior is normal. Judgment and thought content normal.   Lab Results  Component Value Date   WBC 7.0 03/18/2011   HGB 11.3* 03/18/2011   HCT 34.8* 03/18/2011   PLT 182 03/18/2011   GLUCOSE 86 03/18/2011   CHOL 136 09/18/2009   TRIG 82.0 09/18/2009   HDL 55.10 09/18/2009   LDLDIRECT 137.0 10/23/2008   LDLCALC 65 09/18/2009   ALT 36* 03/18/2011   AST 34 03/18/2011    NA 142 03/18/2011   K 4.8 03/18/2011   CL 105 03/18/2011   CREATININE 1.10 03/18/2011   BUN 24* 03/18/2011   CO2 30 03/18/2011   TSH 1.36 12/24/2010   INR 1.0 01/03/2009        Assessment & Plan:  See problem list. Medications and labs reviewed today.  Time spent with pt today 40 minutes, greater than 50% time spent counseling patient on postop course, seizure hx and medication review. Also review of recent hospital records and surg notes

## 2011-04-21 NOTE — Patient Instructions (Addendum)
It was good to see you today. We have reviewed your prior records including labs and tests today Medications reviewed, no changes at this time. Flu shot today we'll make referral to [facility] for your mammogram and bone density. Our office will contact you regarding appointment(s) once made. Check with your insurance about coverage for the shingles shot - if covered, we can give here or give you a written prescription to have the shot administered by your pharmacy Please schedule followup in 3-4 months, call sooner if problems.

## 2011-04-21 NOTE — Assessment & Plan Note (Signed)
On Crestor therapy -  Well-controlled last lipids Follows with cardiology for nonobstructive coronary artery disease The current medical regimen is effective;  continue present plan and medications.

## 2011-04-21 NOTE — Assessment & Plan Note (Signed)
On Multaq > maintaining normal sinus On aspirin therapy, high-risk for Coumadin given seizure disorder Follows with cardiology for same, no current symptoms

## 2011-04-21 NOTE — Assessment & Plan Note (Signed)
Long history of petit mall seizures> episode of grand mal event June 2012 Follows with neurology Dr. Anne Hahn for same Medications adjusted following grand mal seizure> no recurrent breakthrough seizures since that time The current medical regimen is effective;  continue present plan and medications.

## 2011-04-29 LAB — HM MAMMOGRAPHY

## 2011-04-29 LAB — HM DEXA SCAN

## 2011-04-30 ENCOUNTER — Encounter: Payer: Self-pay | Admitting: Internal Medicine

## 2011-05-04 ENCOUNTER — Encounter: Payer: Self-pay | Admitting: Internal Medicine

## 2011-05-05 ENCOUNTER — Encounter: Payer: Self-pay | Admitting: Internal Medicine

## 2011-05-20 ENCOUNTER — Ambulatory Visit (INDEPENDENT_AMBULATORY_CARE_PROVIDER_SITE_OTHER): Payer: Medicare Other | Admitting: Cardiology

## 2011-05-20 ENCOUNTER — Encounter: Payer: Self-pay | Admitting: Cardiology

## 2011-05-20 VITALS — BP 130/68 | HR 76 | Ht 63.0 in | Wt 167.0 lb

## 2011-05-20 DIAGNOSIS — I4891 Unspecified atrial fibrillation: Secondary | ICD-10-CM

## 2011-05-20 NOTE — Assessment & Plan Note (Signed)
Recheck lipid and liver profile.  Check liver with multaq

## 2011-05-20 NOTE — Progress Notes (Signed)
HPI:  She is doing well. Denies any chest pain at the present time.  She does feel her heart rate going fast at times.  She currently is on Multaq for her af.  Her functional status is pretty good.  Denies progressive symptoms.  She has not seen Dr. Anne Hahn since her EEG and her appointment is not scheduled until way out.  I encouraged her to call them back and reschedule, and explain her drugs to them.    Current Outpatient Prescriptions  Medication Sig Dispense Refill  . acetaminophen (TYLENOL) 500 MG tablet Take 500 mg by mouth every 6 (six) hours as needed.        Marland Kitchen albuterol (PROAIR HFA) 108 (90 BASE) MCG/ACT inhaler Inhale 2 puffs into the lungs every 4 (four) hours as needed for wheezing.  1 Inhaler  3  . Ascorbic Acid (VITAMIN C) 500 MG tablet Take 500 mg by mouth daily.        Marland Kitchen aspirin 325 MG tablet Take 325 mg by mouth daily.        . calcium carbonate (OS-CAL) 600 MG TABS Take 600 mg by mouth 3 (three) times daily with meals.        . CRESTOR 20 MG tablet TAKE 1 TABLET BY MOUTH EVERY DAY  30 tablet  5  . cyclobenzaprine (FLEXERIL) 10 MG tablet Take 1 tablet (10 mg total) by mouth 3 (three) times daily as needed.  90 tablet  3  . diclofenac (VOLTAREN) 75 MG EC tablet Take 75 mg by mouth 2 (two) times daily.        Marland Kitchen dronedarone (MULTAQ) 400 MG tablet Take 1 tablet (400 mg total) by mouth 2 (two) times daily with a meal.  60 tablet  11  . ferrous sulfate 325 (65 FE) MG tablet Take 325 mg by mouth 2 (two) times daily.        . fluticasone (FLONASE) 50 MCG/ACT nasal spray 2 sprays by Nasal route daily.        Marland Kitchen ibandronate (BONIVA) 150 MG tablet Take 150 mg by mouth every 30 (thirty) days. Take in the morning with a full glass of water, on an empty stomach, and do not take anything else by mouth or lie down for the next 30 min.       . levETIRAcetam (KEPPRA) 500 MG tablet Take 1 tablet (500 mg total) by mouth 2 (two) times daily.  60 tablet  1  . loperamide (IMODIUM A-D) 2 MG tablet Take  2 mg by mouth as needed.        . metoprolol (TOPROL-XL) 50 MG 24 hr tablet TAKE 1 TABLET BY MOUTH EVERY DAY  30 tablet  5  . Multiple Vitamin (MULTIVITAMIN) tablet Take 1 tablet by mouth daily.        . nitroGLYCERIN (NITROSTAT) 0.4 MG SL tablet Place 0.4 mg under the tongue every 5 (five) minutes as needed.        Marland Kitchen omeprazole (PRILOSEC) 40 MG capsule Take 1 capsule (40 mg total) by mouth 2 (two) times daily.  60 capsule  11  . PHENObarbital (LUMINAL) 60 MG tablet Take 1 tablet (60 mg total) by mouth 2 (two) times daily.  60 tablet  5  . potassium chloride (K-DUR,KLOR-CON) 10 MEQ tablet       . potassium chloride (KLOR-CON) 10 MEQ CR tablet Take 1 tablet (10 mEq total) by mouth daily.  30 tablet  11  . QVAR 40 MCG/ACT inhaler INHALE  2 PUFFS BY MOUTH TWICE DAILY  1 Inhaler  6  . sodium chloride (OCEAN) 0.65 % nasal spray 3 sprays by Nasal route 3 (three) times daily.       Marland Kitchen HYDROcodone-acetaminophen (NORCO) 5-325 MG per tablet       . sucralfate (CARAFATE) 1 G tablet Take 1 tablet (1 g total) by mouth 4 (four) times daily. Before meals and bedtime  120 tablet  11    Allergies  Allergen Reactions  . Codeine Other (See Comments)    Caused possible hallucinations and anxiety  . Latex Rash  . Penicillins Swelling    Lips  . Triple Antibiotic Itching and Rash    Past Medical History  Diagnosis Date  . CAD (coronary artery disease)     nonobst cath 12/2008  . HLD (hyperlipidemia)   . Impacted cerumen   . Calculus of gallbladder without mention of cholecystitis or obstruction 03/2011    s/p lap chole  . IBS (irritable bowel syndrome)   . GERD (gastroesophageal reflux disease)   . Tinnitus   . Seizure disorder     Grand mal seizure 7/12, [facility]  . Unspecified hypothyroidism   . Anemia   . Allergic rhinitis, cause unspecified   . Major depressive disorder, single episode, severe, without mention of psychotic behavior   . Arthritis of back   . 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  Osteopenia   . Atrial fibrillation   . Asthma   . Osteoporosis   . Seizures     Past Surgical History  Procedure Date  . Total abdominal hysterectomy   . Wrist fracture surgery     with plates (left)  . Tonsillectomy     age 7  . Cholecystectomy 03/25/2011    Family History  Problem Relation Age of Onset  . Angina Mother   . Alzheimer's disease Mother   . Heart attack Father   . Heart failure Father   . Heart disease Father   . Heart disease Brother   . Breast cancer Maternal Aunt   . Cancer Maternal Aunt     breast  . Cervical cancer Maternal Aunt   . Cancer Maternal Aunt     ovarian  . Colon cancer Neg Hx     History   Social History  . Marital Status: Divorced    Spouse Name: N/A    Number of Children: N/A  . Years of Education: N/A   Occupational History  . hair dresser at [employer]    Social History Main Topics  . Smoking status: Never Smoker   . Smokeless tobacco: Never Used  . Alcohol Use: No  . Drug Use: No  . Sexually Active: No   Other Topics Concern  . Not on file   Social History Narrative  . No narrative on file    ROS: Please see the HPI.  All other systems reviewed and negative.  PHYSICAL EXAM:  BP 130/68  Pulse 76  Ht 5\' 3"  (1.6 m)  Wt 75.751 kg (167 lb)  BMI 29.58 kg/m2  General: Well developed, well nourished, in no acute distress. Head:  Normocephalic and atraumatic. Neck: no JVD Lungs: Clear to auscultation and percussion. Heart: Normal S1 and S2.  No murmur, rubs or gallops.  Abdomen:  Normal bowel sounds; soft; non tender; no organomegaly Pulses: Pulses normal in all 4 extremities. Extremities: No clubbing or cyanosis. No edema. Neurologic: Alert and oriented x 3.  EKG:  NSR.  QTc normal.  Nonspecific T flattening   ASSESSMENT AND PLAN:

## 2011-05-20 NOTE — Patient Instructions (Addendum)
Please contact Dr Anne Hahn office and arrange earlier follow-up.  Your physician recommends that you return for a FASTING lipid profile, liver, BMP, TSH and CBC---nothing to eat or drink after midnight, lab opens at 8:30.  Your physician recommends that you schedule a follow-up appointment in: 3 MONTHS.  Your physician recommends that you continue on your current medications as directed. Please refer to the Current Medication list given to you today.

## 2011-05-20 NOTE — Assessment & Plan Note (Signed)
No recurrent symptoms.  Continue medical therapy.  

## 2011-05-20 NOTE — Assessment & Plan Note (Signed)
Seems to be holding NSR.

## 2011-05-20 NOTE — Assessment & Plan Note (Signed)
Recheck CBC. 

## 2011-05-21 ENCOUNTER — Ambulatory Visit: Payer: Medicare Other

## 2011-05-22 ENCOUNTER — Ambulatory Visit: Payer: Medicare Other | Admitting: Cardiology

## 2011-05-23 ENCOUNTER — Other Ambulatory Visit: Payer: Self-pay | Admitting: Family Medicine

## 2011-06-01 ENCOUNTER — Other Ambulatory Visit: Payer: Self-pay | Admitting: Family Medicine

## 2011-06-03 ENCOUNTER — Other Ambulatory Visit: Payer: Self-pay | Admitting: *Deleted

## 2011-06-03 MED ORDER — PHENOBARBITAL 60 MG PO TABS: 60.0000 mg | ORAL_TABLET | Freq: Two times a day (BID) | ORAL | Status: DC

## 2011-06-03 NOTE — Telephone Encounter (Signed)
Faxed script back to [facility]...06/03/11@ 1:38pm/LMB

## 2011-06-05 NOTE — Progress Notes (Signed)
Addended by: Kem Parkinson on: 06/05/2011 08:37 AM   Modules accepted: Orders

## 2011-06-08 ENCOUNTER — Other Ambulatory Visit (INDEPENDENT_AMBULATORY_CARE_PROVIDER_SITE_OTHER): Payer: Medicare Other | Admitting: *Deleted

## 2011-06-08 DIAGNOSIS — Z79899 Other long term (current) drug therapy: Secondary | ICD-10-CM

## 2011-06-08 DIAGNOSIS — I4891 Unspecified atrial fibrillation: Secondary | ICD-10-CM

## 2011-06-08 LAB — CBC WITH DIFFERENTIAL/PLATELET
Basophils Absolute: 0 10*3/uL (ref 0.0–0.1)
Eosinophils Relative: 0.8 % (ref 0.0–5.0)
Hemoglobin: 11.6 g/dL — ABNORMAL LOW (ref 12.0–15.0)
Lymphocytes Relative: 20.4 % (ref 12.0–46.0)
Monocytes Relative: 7.6 % (ref 3.0–12.0)
Platelets: 181 10*3/uL (ref 150.0–400.0)
RDW: 13.6 % (ref 11.5–14.6)
WBC: 5.9 10*3/uL (ref 4.5–10.5)

## 2011-06-08 LAB — BASIC METABOLIC PANEL
BUN: 23 mg/dL (ref 6–23)
Calcium: 8.7 mg/dL (ref 8.4–10.5)
GFR: 51.77 mL/min — ABNORMAL LOW (ref >60.00)
Glucose, Bld: 90 mg/dL (ref 70–99)
Potassium: 4.5 mEq/L (ref 3.5–5.1)
Sodium: 146 mEq/L — ABNORMAL HIGH (ref 135–145)

## 2011-06-08 LAB — LIPID PANEL
LDL Cholesterol: 69 mg/dL (ref 0–99)
Total CHOL/HDL Ratio: 2
VLDL: 13.8 mg/dL (ref 0.0–40.0)

## 2011-06-09 ENCOUNTER — Ambulatory Visit (INDEPENDENT_AMBULATORY_CARE_PROVIDER_SITE_OTHER): Payer: Medicare Other | Admitting: Internal Medicine

## 2011-06-09 ENCOUNTER — Encounter: Payer: Self-pay | Admitting: Internal Medicine

## 2011-06-09 ENCOUNTER — Encounter: Payer: Self-pay | Admitting: *Deleted

## 2011-06-09 VITALS — BP 120/82 | HR 77 | Temp 97.0°F | Wt 175.2 lb

## 2011-06-09 DIAGNOSIS — R071 Chest pain on breathing: Secondary | ICD-10-CM

## 2011-06-09 DIAGNOSIS — B349 Viral infection, unspecified: Secondary | ICD-10-CM

## 2011-06-09 DIAGNOSIS — B9789 Other viral agents as the cause of diseases classified elsewhere: Secondary | ICD-10-CM

## 2011-06-09 LAB — HEPATIC FUNCTION PANEL
ALT: 35 U/L (ref 0–35)
AST: 26 U/L (ref 0–37)
Alkaline Phosphatase: 103 U/L (ref 39–117)
Total Bilirubin: 0.5 mg/dL (ref 0.3–1.2)

## 2011-06-09 MED ORDER — DOXYCYCLINE HYCLATE 100 MG PO TABS: 100.0000 mg | ORAL_TABLET | Freq: Two times a day (BID) | ORAL | Status: AC

## 2011-06-09 MED ORDER — TIZANIDINE HCL 4 MG PO TABS: 4.0000 mg | ORAL_TABLET | Freq: Four times a day (QID) | ORAL | Status: AC | PRN

## 2011-06-09 NOTE — Progress Notes (Signed)
  Subjective:    HPI  complains of head cold symptoms  Onset 3 days ago, progressive symptoms  associated with rhinorrhea, crusting of eyelids and tearing in the morning, sneezing, sore throat, mild headache and low grade fever/chills Also myalgias and mild chest congestion Wants to prevent progression into bronchitis Not using any OTC meds Precipitated by sick contacts  Past Medical History  Diagnosis Date  . CAD (coronary artery disease)     nonobst cath 12/2008  . HLD (hyperlipidemia)   . Calculus of gallbladder without mention of cholecystitis or obstruction 03/2011    s/p lap chole  . IBS (irritable bowel syndrome)   . GERD (gastroesophageal reflux disease)   . Tinnitus   . Seizure disorder     Grand mal seizure 7/12, [facility]  . Unspecified hypothyroidism   . Anemia   . Allergic rhinitis, cause unspecified   . Major depressive disorder, single episode, severe, without mention of psychotic behavior   . Arthritis of back   . 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 Osteopenia   . Atrial fibrillation   . Asthma   . Osteoporosis   . Seizures     Review of Systems Constitutional: No night sweats, no unexpected weight change Pulmonary: No pleurisy or hemoptysis Cardiovascular: No chest pain or palpitations     Objective:   Physical Exam BP 120/82  Pulse 77  Temp(Src) 97 F (36.1 C) (Oral)  Wt 175 lb 3.2 oz (79.47 kg)  SpO2 97% GEN: mildly ill appearing and audible head congestion, hoarse quality of voice HENT: NCAT, no sinus tenderness bilaterally, nares without discharge, oropharynx mild erythema, no exudate Eyes: Vision grossly intact, minimal bilateral conjunctivitis Lungs: Clear to auscultation without rhonchi or wheeze, no increased work of breathing Cardiovascular: Regular rate and rhythm, no bilateral edema      Assessment & Plan:  Viral URI > syndrome associated with mild bilateral conjunctivitis, rhinorrhea and pharyngitis  Explained lack of efficacy  for antibiotics in viral disease Received flu vaccination October 2012 Written prescription for empiric antibiotics provided to use if symptom duration greater than 7 days or if worse Symptomatic care with Tylenol, hydration and rest -  salt gargle advised as needed Work excuse for volunteer activities provided to use for next 2 weeks    Chronic costochondritis> control symptoms with muscle relaxant prn Requests change to cheaper generic Generic Zanaflex prescribed in place of Flexeril per request

## 2011-06-09 NOTE — Patient Instructions (Signed)
It was good to see you today. If you develop worsening symptoms or fever,we can reconsider antibiotics, but it does not appear necessary to use antibiotics at this time. Printed prescription for doxycycline antibiotics provided for you to fill and use if symptoms worse or unimproved in the next 7 days Change muscle relaxants to portable option as requested - generic Zanaflex submitted to your pharmacy Work excuse for volunteer work to use over next 2 weeks as requested Use tylenol for aches, pain and fever symptoms as discussed, hydrate, warm salt water gargle and get lots of rest!!

## 2011-06-17 ENCOUNTER — Other Ambulatory Visit: Payer: Self-pay

## 2011-06-17 DIAGNOSIS — I4891 Unspecified atrial fibrillation: Secondary | ICD-10-CM

## 2011-06-18 ENCOUNTER — Telehealth: Payer: Self-pay | Admitting: Cardiology

## 2011-06-18 NOTE — Telephone Encounter (Signed)
Called pt's work number and she has not arrived at this time.  I left a message for the pt to call the office.

## 2011-06-18 NOTE — Telephone Encounter (Signed)
New Problem   Patient has questions about care and F/U please return call to patient at wk# [phone].

## 2011-06-18 NOTE — Telephone Encounter (Signed)
I spoke with the pt and she wanted to make me aware that Dr Scotty Court had told her she had an ulcer due to her reflux.  The pt said then she found out that it was actually her gallbladder causing problems and she had a cholecystecomy. The pt was wondering if an ulcer could cause her anemia.  I told her that if she had an ulcer and it was bleeding then this could cause anemia. The pt said she has not seen GI or had any testing to see if she has an ulcer.  I recommended that the pt contact her PCP for further evaluation if she would like to determine if she has an ulcer. Pt agreed with plan.

## 2011-06-20 ENCOUNTER — Other Ambulatory Visit: Payer: Self-pay | Admitting: Family Medicine

## 2011-06-21 ENCOUNTER — Other Ambulatory Visit: Payer: Self-pay | Admitting: Family Medicine

## 2011-06-22 ENCOUNTER — Other Ambulatory Visit: Payer: Self-pay | Admitting: Internal Medicine

## 2011-06-23 ENCOUNTER — Other Ambulatory Visit (INDEPENDENT_AMBULATORY_CARE_PROVIDER_SITE_OTHER): Payer: Medicare Other | Admitting: *Deleted

## 2011-06-23 DIAGNOSIS — D649 Anemia, unspecified: Secondary | ICD-10-CM

## 2011-06-23 DIAGNOSIS — I4891 Unspecified atrial fibrillation: Secondary | ICD-10-CM

## 2011-06-23 LAB — TSH: TSH: 0.37 u[IU]/mL (ref 0.35–5.50)

## 2011-06-29 ENCOUNTER — Encounter: Payer: Self-pay | Admitting: Internal Medicine

## 2011-06-29 ENCOUNTER — Ambulatory Visit (INDEPENDENT_AMBULATORY_CARE_PROVIDER_SITE_OTHER): Payer: Medicare Other | Admitting: Internal Medicine

## 2011-06-29 DIAGNOSIS — K219 Gastro-esophageal reflux disease without esophagitis: Secondary | ICD-10-CM

## 2011-06-29 DIAGNOSIS — K589 Irritable bowel syndrome without diarrhea: Secondary | ICD-10-CM

## 2011-06-29 MED ORDER — SUCRALFATE 1 G PO TABS: 1.0000 g | ORAL_TABLET | Freq: Four times a day (QID) | ORAL | Status: DC

## 2011-06-29 MED ORDER — PANTOPRAZOLE SODIUM 40 MG PO TBEC: 40.0000 mg | DELAYED_RELEASE_TABLET | Freq: Every day | ORAL | Status: DC

## 2011-06-29 NOTE — Patient Instructions (Signed)
It was good to see you today. We have reviewed your prior records including labs and tests today Stop omeprazole and start generic protonix for acide refluxc and stomach inflammation - also resume carafate - Your prescription(s) have been submitted to your pharmacy. Please take as directed and contact our office if you believe you are having problem(s) with the medication(s).

## 2011-06-29 NOTE — Progress Notes (Signed)
Subjective:    Patient ID: Misty Meyer, female    DOB: [birth year], 71 y.o.   MRN: [id number]  HPI  complains of abdominal pain and "bloating" Onset symptoms 6 weeks ago associated with increase reflux and SS burning - also mild RUQ/RLQ discomfort No change bowels - no nausea and vomiting  Not associated with fever  PMH significant for cholecystectomy 03/25/2011 and GERD  Also reviewed chronic medical issues:   history of nonobstructive CAD, cardiac catheterization 8/10: Vigorous LV function, ostial LAD 50%,   paroxysmal atrial fibrillation prompting admissions to the hospital 2/11, 10/11 and 1/12.  She was placed on Multaq after presentation to the hospital 1/12.  She has been maintained on aspirin only.  She has remained in sinus rhythm since that time.    seizure disorder, grand mal seizure in the summer 2012 and is now on Keppra in addition to Phenobarbital.    also hyperlipidemia, GERD, multinodular goiter, lung nodule and asthma.  Past Medical History  Diagnosis Date  . CAD (coronary artery disease)     nonobst cath 12/2008  . HLD (hyperlipidemia)   . Calculus of gallbladder without mention of cholecystitis or obstruction 03/2011    s/p lap chole  . IBS (irritable bowel syndrome)   . GERD (gastroesophageal reflux disease)   . Tinnitus   . Seizure disorder     Grand mal seizure 7/12, [facility]  . Unspecified hypothyroidism   . Anemia   . Allergic rhinitis, cause unspecified   . Major depressive disorder, single episode, severe, without mention of psychotic behavior   . Arthritis of back   . 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 Osteopenia   . Atrial fibrillation   . Asthma   . Osteoporosis   . Seizures      Review of Systems  Constitutional: Negative for fever or unexpected weight change.  Respiratory: Negative for cough and shortness of breath.   Cardiovascular: Negative for chest pain or palpitations.      Objective:   Physical Exam  BP 124/70   Pulse 77  Temp(Src) 97.6 F (36.4 C) (Oral)  Ht 5\' 3"  (1.6 m)  Wt 172 lb 1.3 oz (78.055 kg)  BMI 30.48 kg/m2  SpO2 97% Wt Readings from Last 3 Encounters:  06/29/11 172 lb 1.3 oz (78.055 kg)  06/09/11 175 lb 3.2 oz (79.47 kg)  05/20/11 167 lb (75.751 kg)   Constitutional: She appears well-developed and well-nourished. No distress.  Neck: Normal range of motion. Neck supple. No JVD present. No thyromegaly present.  Cardiovascular: Normal rate, regular rhythm and normal heart sounds.  No murmur heard. No BLE edema. Pulmonary/Chest: Effort normal and breath sounds normal. No respiratory distress. She has no wheezes.  Abdominal: Soft. Bowel sounds are normal. She exhibits no distension. There is no tenderness. no masses - well healed lap chole scars   Lab Results  Component Value Date   WBC 5.9 06/08/2011   HGB 11.6* 06/08/2011   HCT 34.3* 06/08/2011   PLT 181.0 06/08/2011   GLUCOSE 90 06/08/2011   CHOL 138 06/08/2011   TRIG 69.0 06/08/2011   HDL 55.60 06/08/2011   LDLDIRECT 137.0 10/23/2008   LDLCALC 69 06/08/2011   ALT 35 06/08/2011   AST 26 06/08/2011   NA 146* 06/08/2011   K 4.5 06/08/2011   CL 114* 06/08/2011   CREATININE 1.1 06/08/2011   BUN 23 06/08/2011   CO2 27 06/08/2011   TSH 0.37 06/23/2011   INR 1.0 01/03/2009  Assessment & Plan:  See problem list. Medications and labs reviewed today.  abdominal pain and "bloating" suspect related to known IBS dx - but also increase in reflux so will change PPI - see GERD below

## 2011-06-29 NOTE — Assessment & Plan Note (Signed)
Long history of same known, review symptoms and diagnosis with patient today

## 2011-06-29 NOTE — Assessment & Plan Note (Signed)
Previously on Prevacid, changed to omeprazole early 2012 Increased indigestion and acid reflux now - will change to different PPI -pantoprazole selected Also encouraged to resume Carafate, refills provided Consider followup with GI as needed

## 2011-08-03 ENCOUNTER — Encounter: Payer: Self-pay | Admitting: Internal Medicine

## 2011-08-03 ENCOUNTER — Ambulatory Visit (INDEPENDENT_AMBULATORY_CARE_PROVIDER_SITE_OTHER): Payer: Medicare Other | Admitting: Internal Medicine

## 2011-08-03 DIAGNOSIS — G40909 Epilepsy, unspecified, not intractable, without status epilepticus: Secondary | ICD-10-CM

## 2011-08-03 DIAGNOSIS — M62838 Other muscle spasm: Secondary | ICD-10-CM

## 2011-08-03 DIAGNOSIS — M810 Age-related osteoporosis without current pathological fracture: Secondary | ICD-10-CM

## 2011-08-03 MED ORDER — METHOCARBAMOL 500 MG PO TABS: 500.0000 mg | ORAL_TABLET | Freq: Every evening | ORAL | Status: AC | PRN

## 2011-08-03 NOTE — Patient Instructions (Addendum)
It was good to see you today. Medications reviewed, list "cleaned up" - start muscle relaxer at bedtime - Your prescription(s) have been submitted to your pharmacy. Please take as directed and contact our office if you believe you are having problem(s) with the medication(s). we'll make referral to physical therapy. Our office will contact you regarding appointment(s) once made. Please schedule followup in 6 months for review, call sooner if problems.

## 2011-08-03 NOTE — Assessment & Plan Note (Signed)
DEXA 04/2011 reviewed Continue bisphosphonate therapy with Boniva and ongoing calcium plus vitamin D therapy Denies history of accidental fracture and no current or new bone pain

## 2011-08-03 NOTE — Assessment & Plan Note (Signed)
Long history of petit mal seizures> episode of grand mal event June 2012 Follows with neurology Dr. Anne Hahn for same Medications adjusted following grand mal seizure> no recurrent breakthrough seizures since that time The current medical regimen is effective;  continue present plan and medications.  Med levels checked with neuro - defer as ongoing
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
## 2011-08-05 ENCOUNTER — Ambulatory Visit: Payer: Medicare Other | Admitting: Cardiology

## 2011-08-31 ENCOUNTER — Other Ambulatory Visit: Payer: Self-pay | Admitting: Endocrinology

## 2011-09-07 ENCOUNTER — Encounter: Payer: Self-pay | Admitting: Cardiology

## 2011-09-07 ENCOUNTER — Ambulatory Visit (INDEPENDENT_AMBULATORY_CARE_PROVIDER_SITE_OTHER): Payer: Medicare Other | Admitting: Cardiology

## 2011-09-07 VITALS — BP 120/74 | HR 90 | Ht 63.0 in | Wt 173.8 lb

## 2011-09-07 DIAGNOSIS — I4891 Unspecified atrial fibrillation: Secondary | ICD-10-CM

## 2011-09-07 DIAGNOSIS — E785 Hyperlipidemia, unspecified: Secondary | ICD-10-CM

## 2011-09-07 DIAGNOSIS — I2510 Atherosclerotic heart disease of native coronary artery without angina pectoris: Secondary | ICD-10-CM

## 2011-09-07 NOTE — Assessment & Plan Note (Signed)
No further chest pain.  Continue meds.

## 2011-09-07 NOTE — Assessment & Plan Note (Signed)
Much improved at the present time.  Seems to maintaining NSR.  No current symptoms.  Will continue at present.

## 2011-09-07 NOTE — Patient Instructions (Signed)
Your physician recommends that you schedule a follow-up appointment in: 4 MONTHS  Your physician recommends that you continue on your current medications as directed. Please refer to the Current Medication list given to you today.   

## 2011-09-07 NOTE — Assessment & Plan Note (Signed)
LDL at target.  LFTs are normal.   

## 2011-09-07 NOTE — Progress Notes (Signed)
HPI:  Patient is getting along well.  She had phenobarb level done at [facility], and was told it was ok.  She thinks the Multaq has helped her a lot, stabilizing her recurrent atrial fib.  Tolerating meds pretty well at this point.  Had labs done with Dr. Felicity Coyer.  No exertional angina at present.    Current Outpatient Prescriptions  Medication Sig Dispense Refill  . acetaminophen (TYLENOL) 500 MG tablet Take 500 mg by mouth every 6 (six) hours as needed.        Marland Kitchen albuterol (PROAIR HFA) 108 (90 BASE) MCG/ACT inhaler Inhale 2 puffs into the lungs every 4 (four) hours as needed for wheezing.  1 Inhaler  3  . Ascorbic Acid (VITAMIN C) 500 MG tablet Take 500 mg by mouth daily.        Marland Kitchen aspirin 325 MG tablet Take 325 mg by mouth daily.        . calcium carbonate (OS-CAL) 600 MG TABS Take 600 mg by mouth 3 (three) times daily with meals.        . CRESTOR 20 MG tablet TAKE 1 TABLET BY MOUTH EVERY DAY  30 tablet  5  . cyclobenzaprine (FLEXERIL) 10 MG tablet Take 10 mg by mouth 3 (three) times daily as needed.       . dronedarone (MULTAQ) 400 MG tablet Take 1 tablet (400 mg total) by mouth 2 (two) times daily with a meal.  60 tablet  11  . ferrous sulfate 325 (65 FE) MG tablet Take 325 mg by mouth 2 (two) times daily.        . fluticasone (FLONASE) 50 MCG/ACT nasal spray USE 2 SPRAYS IN EACH NOSTRIL ONCE DAILY  16 g  2  . ibandronate (BONIVA) 150 MG tablet Take 150 mg by mouth every 30 (thirty) days. Take in the morning with a full glass of water, on an empty stomach, and do not take anything else by mouth or lie down for the next 30 min.       . levETIRAcetam (KEPPRA) 500 MG tablet Take 1 tablet (500 mg total) by mouth 2 (two) times daily.  60 tablet  1  . loperamide (IMODIUM A-D) 2 MG tablet Take 2 mg by mouth as needed.        . methocarbamol (ROBAXIN) 500 MG tablet Take 500 mg by mouth as needed.       . metoprolol (TOPROL-XL) 50 MG 24 hr tablet TAKE 1 TABLET BY MOUTH EVERY DAY  30 tablet  5    . Multiple Vitamin (MULTIVITAMIN) tablet Take 1 tablet by mouth daily.        . nitroGLYCERIN (NITROSTAT) 0.4 MG SL tablet Place 0.4 mg under the tongue every 5 (five) minutes as needed.        . pantoprazole (PROTONIX) 40 MG tablet Take 1 tablet (40 mg total) by mouth daily.  30 tablet  3  . PHENObarbital (LUMINAL) 60 MG tablet Take 1 tablet (60 mg total) by mouth 2 (two) times daily.  60 tablet  5  . potassium chloride (KLOR-CON) 10 MEQ CR tablet Take 1 tablet (10 mEq total) by mouth daily.  30 tablet  11  . QVAR 40 MCG/ACT inhaler INHALE 2 PUFFS BY MOUTH TWICE DAILY  1 Inhaler  6  . sodium chloride (OCEAN) 0.65 % nasal spray 3 sprays by Nasal route 3 (three) times daily.       . sucralfate (CARAFATE) 1 G tablet Take 1  tablet (1 g total) by mouth 4 (four) times daily. Before meals and bedtime  120 tablet  11    Allergies  Allergen Reactions  . Codeine Other (See Comments)    Caused possible hallucinations and anxiety  . Latex Rash  . Penicillins Swelling    Lips  . Triple Antibiotic Itching and Rash    Past Medical History  Diagnosis Date  . CAD (coronary artery disease)     nonobst cath 12/2008  . HLD (hyperlipidemia)   . Calculus of gallbladder without mention of cholecystitis or obstruction 03/2011    s/p lap chole  . IBS (irritable bowel syndrome)   . GERD (gastroesophageal reflux disease)   . Tinnitus   . Seizure disorder     Grand mal seizure 7/12, [facility]  . Unspecified hypothyroidism   . Anemia   . Allergic rhinitis, cause unspecified   . Major depressive disorder, single episode, severe, without mention of psychotic behavior   . Arthritis of back   . Atrial fibrillation   . Asthma   . Osteoporosis     DEXA 04/2011: -3.1 L fem, on Boniva    Past Surgical History  Procedure Date  . Total abdominal hysterectomy   . Wrist fracture surgery     with plates (left)  . Tonsillectomy     age 13  . Cholecystectomy 03/25/2011    Family History  Problem  Relation Age of Onset  . Angina Mother   . Alzheimer's disease Mother   . Heart attack Father   . Heart failure Father   . Heart disease Father   . Heart disease Brother   . Breast cancer Maternal Aunt   . Cancer Maternal Aunt     breast  . Cervical cancer Maternal Aunt   . Cancer Maternal Aunt     ovarian  . Colon cancer Neg Hx     History   Social History  . Marital Status: Divorced    Spouse Name: N/A    Number of Children: N/A  . Years of Education: N/A   Occupational History  . hair dresser at [employer]    Social History Main Topics  . Smoking status: Never Smoker   . Smokeless tobacco: Never Used  . Alcohol Use: No  . Drug Use: No  . Sexually Active: No   Other Topics Concern  . Not on file   Social History Narrative  . No narrative on file    ROS: Please see the HPI.  All other systems reviewed and negative.  PHYSICAL EXAM:  BP 120/74  Pulse 90  Ht 5\' 3"  (1.6 m)  Wt 173 lb 12.8 oz (78.835 kg)  BMI 30.79 kg/m2  General: Well developed, well nourished, in no acute distress. Head:  Normocephalic and atraumatic. Neck: no JVD Lungs: Clear to auscultation and percussion. Heart: Normal S1 and S2.  No murmur, rubs or gallops.  Pulses: Pulses normal in all 4 extremities. Extremities: No clubbing or cyanosis. No edema. Neurologic: Alert and oriented x 3.  EKG:  NSR. Borderline LAE.  Nonspecific T flattening.    ASSESSMENT AND PLAN:

## 2011-09-21 ENCOUNTER — Other Ambulatory Visit: Payer: Self-pay | Admitting: Cardiology

## 2011-09-21 DIAGNOSIS — I4891 Unspecified atrial fibrillation: Secondary | ICD-10-CM

## 2011-09-21 MED ORDER — DRONEDARONE HCL 400 MG PO TABS: 400.0000 mg | ORAL_TABLET | Freq: Two times a day (BID) | ORAL | Status: DC

## 2011-09-22 ENCOUNTER — Ambulatory Visit (INDEPENDENT_AMBULATORY_CARE_PROVIDER_SITE_OTHER): Payer: Medicare Other | Admitting: Internal Medicine

## 2011-09-22 ENCOUNTER — Encounter: Payer: Self-pay | Admitting: Internal Medicine

## 2011-09-22 VITALS — BP 122/70 | HR 83 | Temp 98.0°F | Ht 64.0 in | Wt 174.6 lb

## 2011-09-22 DIAGNOSIS — M1990 Unspecified osteoarthritis, unspecified site: Secondary | ICD-10-CM

## 2011-09-22 DIAGNOSIS — G40909 Epilepsy, unspecified, not intractable, without status epilepticus: Secondary | ICD-10-CM

## 2011-09-22 DIAGNOSIS — K219 Gastro-esophageal reflux disease without esophagitis: Secondary | ICD-10-CM

## 2011-09-22 MED ORDER — DICLOFENAC SODIUM 75 MG PO TBEC: 75.0000 mg | DELAYED_RELEASE_TABLET | Freq: Two times a day (BID) | ORAL | Status: DC | PRN

## 2011-09-22 NOTE — Assessment & Plan Note (Signed)
Long history of petit mal seizures> episode of grand mal event June 2012 Follows with neurology Dr. Anne Hahn for same Medications adjusted following grand mal seizure> no recurrent breakthrough seizures since that time The current medical regimen is effective;  continue present plan and medications.  Med levels checked with neuro - defer as ongoing

## 2011-09-22 NOTE — Progress Notes (Signed)
  Subjective:    Patient ID: Misty Meyer, female    DOB: 08/20/1940, 71 y.o.   MRN: [id number]  HPI  complains of "diarrhea" - describes loose stools due to PPI Onset following initiation of protonix No BRBPR or melena, no abdominal pain or fever symptoms have improved with change of dose of PPI  Also ?resume diclofenac - more effective   Past Medical History  Diagnosis Date  . CAD (coronary artery disease)     nonobst cath 12/2008  . HLD (hyperlipidemia)   . Calculus of gallbladder without mention of cholecystitis or obstruction 03/2011    s/p lap chole  . IBS (irritable bowel syndrome)   . GERD (gastroesophageal reflux disease)   . Tinnitus   . Seizure disorder     Grand mal seizure 7/12, [facility]  . Unspecified hypothyroidism   . Anemia   . Allergic rhinitis, cause unspecified   . Major depressive disorder, single episode, severe, without mention of psychotic behavior   . Arthritis of back   . Atrial fibrillation     on Multaq  . Asthma   . Osteoporosis     DEXA 04/2011: -3.1 L fem, on Boniva    Review of Systems  Constitutional: Negative for fever, fatigue and unexpected weight change.  Respiratory: Negative for cough and shortness of breath.   Gastrointestinal: Negative for constipation and blood in stool.       Objective:   Physical Exam BP 122/70  Pulse 83  Temp(Src) 98 F (36.7 C) (Oral)  Ht 5\' 4"  (1.626 m)  Wt 174 lb 9.6 oz (79.198 kg)  BMI 29.97 kg/m2  SpO2 96% Wt Readings from Last 3 Encounters:  09/22/11 174 lb 9.6 oz (79.198 kg)  09/07/11 173 lb 12.8 oz (78.835 kg)  08/03/11 174 lb 12.8 oz (79.289 kg)   Constitutional: She appears well-developed and well-nourished. No distress.  Neck: Normal range of motion. Neck supple. No JVD present. No thyromegaly present.  Cardiovascular: Normal rate, regular rhythm and normal heart sounds.  No murmur heard. No BLE edema. Pulmonary/Chest: Effort normal and breath sounds normal. No respiratory  distress. She has no wheezes.  Abdominal: Soft. Bowel sounds are normal. She exhibits no distension. There is no tenderness. no masses  Lab Results  Component Value Date   WBC 5.9 06/08/2011   HGB 11.6* 06/08/2011   HCT 34.3* 06/08/2011   PLT 181.0 06/08/2011   GLUCOSE 90 06/08/2011   CHOL 138 06/08/2011   TRIG 69.0 06/08/2011   HDL 55.60 06/08/2011   LDLDIRECT 137.0 10/23/2008   LDLCALC 69 06/08/2011   ALT 35 06/08/2011   AST 26 06/08/2011   NA 146* 06/08/2011   K 4.5 06/08/2011   CL 114* 06/08/2011   CREATININE 1.1 06/08/2011   BUN 23 06/08/2011   CO2 27 06/08/2011   TSH 0.37 06/23/2011   INR 1.0 01/03/2009       Assessment & Plan:  See problem list. Medications and labs reviewed today.

## 2011-09-22 NOTE — Patient Instructions (Signed)
It was good to see you today. Medications reviewed, list "cleaned up" - stop ferrous sulfate and carafate Resume diclofenac for arthritis pain as needed - Your prescription(s) have been submitted to your pharmacy. Please take as directed and contact our office if you believe you are having problem(s) with the medication(s). Please schedule followup in 6 months for review, call sooner if problems.

## 2011-09-22 NOTE — Assessment & Plan Note (Signed)
Previously on Prevacid, changed to omeprazole early 2012, then changed to pantoprazole 06/2011 Improved symptomatic indigestion and acid reflux  The current medical regimen is effective;  continue present plan and medications.  Will stop Carafate now and consider followup with GI as needed

## 2011-09-22 NOTE — Assessment & Plan Note (Signed)
Diffuse, low back symptoms generally controlled with ES tylenol Will resume prn diclofenac at pt request - erx done

## 2011-09-23 ENCOUNTER — Telehealth: Payer: Self-pay | Admitting: *Deleted

## 2011-09-23 NOTE — Telephone Encounter (Signed)
  i would notify pt and ask her to contact her neuro Anne Hahn) re: if any substitute recommended  given her sz hx

## 2011-09-23 NOTE — Telephone Encounter (Signed)
Received fax stating pt has rx for phenobarbital 60 mg, and it is currently unavailable from the manufacturers. Is there a alternative md would like to change med to..09/23/11@10 :47am/LMB

## 2011-09-23 NOTE — Telephone Encounter (Signed)
Notified pt gave md response. She states she will contact Dr. Anne Hahn... 09/23/11@2 :50pm/LMB

## 2011-09-27 ENCOUNTER — Other Ambulatory Visit: Payer: Self-pay | Admitting: Cardiology

## 2011-10-06 ENCOUNTER — Telehealth: Payer: Self-pay | Admitting: *Deleted

## 2011-10-06 MED ORDER — PHENOBARBITAL 60 MG PO TABS: 60.0000 mg | ORAL_TABLET | Freq: Two times a day (BID) | ORAL | Status: DC

## 2011-10-06 NOTE — Telephone Encounter (Signed)
Notified pt rx fax to [facility].... 10/06/11@1 :48pm/LMB

## 2011-10-06 NOTE — Telephone Encounter (Signed)
Left msg on vm been having hard time getting phenobarbitol. Spoke with [facility] [address] and they have med in. Requesting md to send rx to [facility] on [address] @ [phone]... 10/06/11@8 :55am/LMB

## 2011-10-06 NOTE — Telephone Encounter (Signed)
Ok to send to pharmacy requested

## 2011-10-07 ENCOUNTER — Other Ambulatory Visit: Payer: Self-pay | Admitting: Cardiology

## 2011-10-07 NOTE — Telephone Encounter (Signed)
Refilled crestor 

## 2011-10-14 ENCOUNTER — Other Ambulatory Visit: Payer: Self-pay | Admitting: *Deleted

## 2011-10-14 MED ORDER — LANSOPRAZOLE 30 MG PO CPDR: 30.0000 mg | DELAYED_RELEASE_CAPSULE | Freq: Every day | ORAL | Status: DC

## 2011-10-14 NOTE — Telephone Encounter (Signed)
stop pantoprazole-  When diarrhea resolves, start generic prevacid 30mg  daily (higher dose than otc)

## 2011-10-14 NOTE — Telephone Encounter (Signed)
Called pt no answer LMOM RTC... 10/15/11@1 :42pm/LMB

## 2011-10-14 NOTE — Telephone Encounter (Signed)
Notified pt with md recommendations.Marland KitchenMarland Kitchen5/15/13@4 :24pm/LMB

## 2011-10-14 NOTE — Telephone Encounter (Signed)
Pt states she was change from omeprazole to pantoprazole, but pt states she has been experiencing diarrhea while taking this med. Requesting md advisement... 10/15/11@11 :54am/LMB

## 2011-10-19 ENCOUNTER — Other Ambulatory Visit: Payer: Self-pay | Admitting: Internal Medicine

## 2011-11-11 ENCOUNTER — Ambulatory Visit (INDEPENDENT_AMBULATORY_CARE_PROVIDER_SITE_OTHER): Payer: Medicare Other | Admitting: Internal Medicine

## 2011-11-11 ENCOUNTER — Encounter: Payer: Self-pay | Admitting: Internal Medicine

## 2011-11-11 VITALS — BP 102/68 | HR 100 | Temp 97.5°F | Ht 64.0 in | Wt 170.1 lb

## 2011-11-11 DIAGNOSIS — I4891 Unspecified atrial fibrillation: Secondary | ICD-10-CM

## 2011-11-11 DIAGNOSIS — M1990 Unspecified osteoarthritis, unspecified site: Secondary | ICD-10-CM

## 2011-11-11 DIAGNOSIS — M62838 Other muscle spasm: Secondary | ICD-10-CM

## 2011-11-11 DIAGNOSIS — K589 Irritable bowel syndrome without diarrhea: Secondary | ICD-10-CM

## 2011-11-11 MED ORDER — TIZANIDINE HCL 4 MG PO TABS: 4.0000 mg | ORAL_TABLET | Freq: Four times a day (QID) | ORAL | Status: DC | PRN

## 2011-11-11 NOTE — Assessment & Plan Note (Signed)
On Multaq > maintaining normal sinus? Appears irreg and symptomatic palpitations at times (now improved) On aspirin therapy, high-risk for Coumadin given seizure disorder Follows with cardiology for same, no current symptoms
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
## 2011-11-11 NOTE — Progress Notes (Signed)
  Subjective:    Patient ID: Misty Meyer, female    DOB: [birth year], 71 y.o.   MRN: [id number]  GI Problem Primary symptoms do not include fever or fatigue.  The illness does not include constipation.  complains of ongoing "diarrhea" - describes loose stools due to omeprazole - No liquid stool or incontinence of stool has not changed to lansoprazole as rx'd Reports onset following initiation of protonix No BRBPR or melena, no abdominal pain or fever symptoms have improved with change of dose of PPI  Also numerous med questions   Past Medical History  Diagnosis Date  . CAD (coronary artery disease)     nonobst cath 12/2008  . HLD (hyperlipidemia)   . Calculus of gallbladder without mention of cholecystitis or obstruction 03/2011    s/p lap chole  . IBS (irritable bowel syndrome)   . GERD (gastroesophageal reflux disease)   . Tinnitus   . Seizure disorder     Grand mal seizure 7/12, [facility]  . Anemia   . Allergic rhinitis, cause unspecified   . Major depressive disorder, single episode, severe, without mention of psychotic behavior   . Arthritis of back   . Atrial fibrillation     on Multaq  . Asthma   . Osteoporosis     DEXA 04/2011: -3.1 L fem, on Boniva    Review of Systems  Constitutional: Negative for fever, fatigue and unexpected weight change.  Respiratory: Negative for cough and shortness of breath.   Gastrointestinal: Negative for constipation and blood in stool.       Objective:   Physical Exam  BP 102/68  Pulse 100  Temp 97.5 F (36.4 C) (Oral)  Ht 5\' 4"  (1.626 m)  Wt 170 lb 1.9 oz (77.166 kg)  BMI 29.20 kg/m2  SpO2 96% Wt Readings from Last 3 Encounters:  11/11/11 170 lb 1.9 oz (77.166 kg)  09/22/11 174 lb 9.6 oz (79.198 kg)  09/07/11 173 lb 12.8 oz (78.835 kg)   Constitutional: She appears well-developed and well-nourished. No distress.  Neck: Normal range of motion. Neck supple. No JVD present. No thyromegaly present.    Cardiovascular: slightly tachy rate, irregular rhythm and normal heart sounds.  No murmur heard. No BLE edema. Pulmonary/Chest: Effort normal and breath sounds normal. No respiratory distress. She has no wheezes.  Abdominal: Soft. Bowel sounds are normal. She exhibits no distension. There is no tenderness. no masses  Lab Results  Component Value Date   WBC 5.9 06/08/2011   HGB 11.6* 06/08/2011   HCT 34.3* 06/08/2011   PLT 181.0 06/08/2011   GLUCOSE 90 06/08/2011   CHOL 138 06/08/2011   TRIG 69.0 06/08/2011   HDL 55.60 06/08/2011   LDLDIRECT 137.0 10/23/2008   LDLCALC 69 06/08/2011   ALT 35 06/08/2011   AST 26 06/08/2011   NA 146* 06/08/2011   K 4.5 06/08/2011   CL 114* 06/08/2011   CREATININE 1.1 06/08/2011   BUN 23 06/08/2011   CO2 27 06/08/2011   TSH 0.37 06/23/2011   INR 1.0 01/03/2009       Assessment & Plan:  See problem list. Medications and labs reviewed today.

## 2011-11-11 NOTE — Patient Instructions (Signed)
It was good to see you today. Medications reviewed, list "cleaned up" -  Change muscle relaxer to lower copay med - tizanidine - Your prescription(s) have been submitted to your pharmacy. Please take as directed and contact our office if you believe you are having problem(s) with the medication(s). Other Medications reviewed, no other changes at this time.

## 2011-11-11 NOTE — Assessment & Plan Note (Signed)
Long history of same known, review symptoms and diagnosis with patient today
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
## 2011-11-12 ENCOUNTER — Other Ambulatory Visit: Payer: Self-pay | Admitting: Cardiology

## 2011-11-12 ENCOUNTER — Other Ambulatory Visit: Payer: Self-pay | Admitting: Critical Care Medicine

## 2011-11-12 NOTE — Telephone Encounter (Signed)
Fax Received. Refill Completed. Francie Keeling Chowoe (R.M.A)   

## 2011-12-17 ENCOUNTER — Ambulatory Visit (INDEPENDENT_AMBULATORY_CARE_PROVIDER_SITE_OTHER): Payer: Medicare Other | Admitting: Internal Medicine

## 2011-12-17 ENCOUNTER — Encounter: Payer: Self-pay | Admitting: Internal Medicine

## 2011-12-17 VITALS — BP 110/72 | HR 72 | Temp 98.2°F | Ht 64.0 in | Wt 173.4 lb

## 2011-12-17 DIAGNOSIS — K589 Irritable bowel syndrome without diarrhea: Secondary | ICD-10-CM

## 2011-12-17 DIAGNOSIS — I4891 Unspecified atrial fibrillation: Secondary | ICD-10-CM

## 2011-12-17 DIAGNOSIS — K219 Gastro-esophageal reflux disease without esophagitis: Secondary | ICD-10-CM

## 2011-12-17 NOTE — Assessment & Plan Note (Signed)
Diarrhea prone, long history of same known Again reviewed symptoms and diagnosis with patient today

## 2011-12-17 NOTE — Assessment & Plan Note (Signed)
On Multaq > maintaining normal sinus occassionally symptomatic - ?irreg palpitations at times (now improved) On aspirin therapy, high-risk for Coumadin given seizure disorder Follows with cardiology for same, no current symptoms

## 2011-12-17 NOTE — Patient Instructions (Signed)
It was good to see you today. We have reviewed your interval history including labs and tests today Medications reviewed, list "cleaned up" - no other changes at this time. we'll make referral to Dr. Marina Goodell . Our office will contact you regarding appointment(s) once made.

## 2011-12-17 NOTE — Assessment & Plan Note (Signed)
Previously on Prevacid, changed to omeprazole early 2012, then changed to pantoprazole 06/2011 periodic symptomatic indigestion and acid reflux persits stopped Carafate 08/2011 and resumed prevacid 09/2011 Will arrange followup with GI as requested

## 2011-12-17 NOTE — Progress Notes (Signed)
  Subjective:    Patient ID: Misty Meyer, female    DOB: Nov 17, 1940, 71 y.o.   MRN: [id number]  GI Problem Primary symptoms do not include fever or fatigue.  The illness does not include constipation.  complains of ongoing "diarrhea" - describes loose stools due to PPI - No liquid stool or incontinence of stool Various type of PPIs tried No BRBPR or melena, no abdominal pain or fever Requests GI eval for same ;last colo 2011  Also numerous med questions   Past Medical History  Diagnosis Date  . CAD (coronary artery disease)     nonobst cath 12/2008  . HLD (hyperlipidemia)   . Calculus of gallbladder without mention of cholecystitis or obstruction 03/2011    s/p lap chole  . IBS (irritable bowel syndrome)   . GERD (gastroesophageal reflux disease)   . Tinnitus   . Seizure disorder     Grand mal seizure 7/12, [facility]  . Anemia   . Allergic rhinitis, cause unspecified   . Major depressive disorder, single episode, severe, without mention of psychotic behavior   . Arthritis of back   . Atrial fibrillation     on Multaq  . Asthma   . Osteoporosis     DEXA 04/2011: -3.1 L fem, on Boniva    Review of Systems  Constitutional: Negative for fever, fatigue and unexpected weight change.  Respiratory: Negative for cough and shortness of breath.   Gastrointestinal: Negative for constipation and blood in stool.       Objective:   Physical Exam  BP 110/72  Pulse 72  Temp 98.2 F (36.8 C) (Oral)  Ht 5\' 4"  (1.626 m)  Wt 173 lb 6.4 oz (78.654 kg)  BMI 29.76 kg/m2  SpO2 97% Wt Readings from Last 3 Encounters:  12/17/11 173 lb 6.4 oz (78.654 kg)  11/11/11 170 lb 1.9 oz (77.166 kg)  09/22/11 174 lb 9.6 oz (79.198 kg)   Constitutional: She appears well-developed and well-nourished. No distress.  Neck: Normal range of motion. Neck supple. No JVD present. No thyromegaly present.  Cardiovascular: slightly tachy rate, irregular rhythm and normal heart sounds.  No murmur  heard. No BLE edema. Pulmonary/Chest: Effort normal and breath sounds normal. No respiratory distress. She has no wheezes.  Abdominal: Soft. Bowel sounds are normal. She exhibits no distension. There is no tenderness. no masses  Lab Results  Component Value Date   WBC 5.9 06/08/2011   HGB 11.6* 06/08/2011   HCT 34.3* 06/08/2011   PLT 181.0 06/08/2011   GLUCOSE 90 06/08/2011   CHOL 138 06/08/2011   TRIG 69.0 06/08/2011   HDL 55.60 06/08/2011   LDLDIRECT 137.0 10/23/2008   LDLCALC 69 06/08/2011   ALT 35 06/08/2011   AST 26 06/08/2011   NA 146* 06/08/2011   K 4.5 06/08/2011   CL 114* 06/08/2011   CREATININE 1.1 06/08/2011   BUN 23 06/08/2011   CO2 27 06/08/2011   TSH 0.37 06/23/2011   INR 1.0 01/03/2009       Assessment & Plan:  See problem list. Medications and labs reviewed today.

## 2011-12-18 ENCOUNTER — Encounter: Payer: Self-pay | Admitting: Internal Medicine

## 2011-12-22 ENCOUNTER — Other Ambulatory Visit: Payer: Self-pay | Admitting: Critical Care Medicine

## 2011-12-22 ENCOUNTER — Encounter: Payer: Self-pay | Admitting: Critical Care Medicine

## 2011-12-22 MED ORDER — ALBUTEROL SULFATE HFA 108 (90 BASE) MCG/ACT IN AERS: 2.0000 | INHALATION_SPRAY | RESPIRATORY_TRACT | Status: DC | PRN

## 2011-12-23 ENCOUNTER — Other Ambulatory Visit: Payer: Self-pay | Admitting: *Deleted

## 2011-12-23 MED ORDER — FLUTICASONE PROPIONATE 50 MCG/ACT NA SUSP: 2.0000 | Freq: Every day | NASAL | Status: DC

## 2011-12-24 ENCOUNTER — Telehealth: Payer: Self-pay | Admitting: *Deleted

## 2011-12-24 NOTE — Telephone Encounter (Signed)
Pt states she is having some abdominal pain, bloating. Requesting to be seen sooner than 1st available. Pt scheduled to see Willette Cluster NP 12/28/11@2pm . Pt aware of appt date and time.

## 2011-12-24 NOTE — Telephone Encounter (Signed)
Gurgling and bloating in mid abdomen with increased burping. Also having increased abdominal pain and would like to be seen sooner than 01/19/12. States Dr Felicity Coyer asked her to call for sooner appointment.

## 2011-12-28 ENCOUNTER — Encounter: Payer: Self-pay | Admitting: Nurse Practitioner

## 2011-12-28 ENCOUNTER — Encounter: Payer: Self-pay | Admitting: Internal Medicine

## 2011-12-28 ENCOUNTER — Other Ambulatory Visit (INDEPENDENT_AMBULATORY_CARE_PROVIDER_SITE_OTHER): Payer: Medicare Other

## 2011-12-28 ENCOUNTER — Ambulatory Visit (INDEPENDENT_AMBULATORY_CARE_PROVIDER_SITE_OTHER): Payer: Medicare Other | Admitting: Nurse Practitioner

## 2011-12-28 VITALS — BP 110/60 | HR 66 | Ht 63.25 in | Wt 170.6 lb

## 2011-12-28 DIAGNOSIS — R1012 Left upper quadrant pain: Secondary | ICD-10-CM | POA: Insufficient documentation

## 2011-12-28 DIAGNOSIS — R194 Change in bowel habit: Secondary | ICD-10-CM | POA: Insufficient documentation

## 2011-12-28 DIAGNOSIS — R110 Nausea: Secondary | ICD-10-CM

## 2011-12-28 DIAGNOSIS — R198 Other specified symptoms and signs involving the digestive system and abdomen: Secondary | ICD-10-CM

## 2011-12-28 LAB — CBC WITH DIFFERENTIAL/PLATELET
Basophils Relative: 0.4 % (ref 0.0–3.0)
Eosinophils Relative: 0 % (ref 0.0–5.0)
HCT: 38.4 % (ref 36.0–46.0)
Hemoglobin: 12.9 g/dL (ref 12.0–15.0)
Lymphocytes Relative: 21.1 % (ref 12.0–46.0)
Lymphs Abs: 1.8 10*3/uL (ref 0.7–4.0)
Monocytes Relative: 5.8 % (ref 3.0–12.0)
Neutro Abs: 6.1 10*3/uL (ref 1.4–7.7)
RBC: 4.09 Mil/uL (ref 3.87–5.11)
RDW: 13.6 % (ref 11.5–14.6)
WBC: 8.4 10*3/uL (ref 4.5–10.5)

## 2011-12-28 LAB — COMPREHENSIVE METABOLIC PANEL
ALT: 29 U/L (ref 0–35)
BUN: 26 mg/dL — ABNORMAL HIGH (ref 6–23)
CO2: 27 mEq/L (ref 19–32)
Calcium: 9.1 mg/dL (ref 8.4–10.5)
Chloride: 106 mEq/L (ref 96–112)
Creatinine, Ser: 1.1 mg/dL (ref 0.4–1.2)
GFR: 50.12 mL/min — ABNORMAL LOW (ref >60.00)
Glucose, Bld: 93 mg/dL (ref 70–99)
Total Bilirubin: 0.3 mg/dL (ref 0.3–1.2)

## 2011-12-28 NOTE — Progress Notes (Signed)
Reviewed and agree with management plans.  Malcolm T. Stark MD FACG  

## 2011-12-28 NOTE — Progress Notes (Signed)
12/28/2011 CAIT LOCUST [id number] May 13, 1941   HISTORY OF PRESENT ILLNESS: Patient is a 71 year old female with multiple medical problems on multiple medications. She gets her screening colonoscopies done with Dr. Marina Goodell. She was last seen in August 2012 for abdominal pain and subsequently had a laparoscopy cholecystectomy for symptomatic cholelithiasis. Patient is here today with several GI issues. She complains of LUQ pain,  bloating, belching, gas and loose stool (several weeks).  She complains of fatigue as well. Patient describes gurgling in left upper abdomen.She has had some mild intermittent nausea but this isn't a major issue for her.  Though her stools are loose she isn't having profuse diarrhea. In fact, she doesn't even have a BM everyday. Patient gives a history of costchochondritis with pain in left chest pain.   Of  note, she was recently changed from Omeprazole to Prevacid. Patient used to take OTC Prevacid but was on 15mg  daily. Patient told years ago he had a "spastic colon".     Past Medical History  Diagnosis Date  . CAD (coronary artery disease)     nonobst cath 12/2008  . HLD (hyperlipidemia)   . Calculus of gallbladder without mention of cholecystitis or obstruction 03/2011    s/p lap chole  . IBS (irritable bowel syndrome)   . GERD (gastroesophageal reflux disease)   . Tinnitus   . Seizure disorder     Grand mal seizure 7/12, [facility]  . Anemia   . Allergic rhinitis, cause unspecified   . Major depressive disorder, single episode, severe, without mention of psychotic behavior   . Arthritis of back   . Atrial fibrillation     on Multaq  . Asthma   . Osteoporosis     DEXA 04/2011: -3.1 L fem, on Boniva   Past Surgical History  Procedure Date  . Total abdominal hysterectomy   . Wrist fracture surgery     with plates (left)  . Tonsillectomy     age 35  . Cholecystectomy 03/25/2011    reports that she has never smoked. She has never used smokeless  tobacco. She reports that she does not drink alcohol or use illicit drugs. family history includes Alzheimer's disease in her mother; Angina in her mother; Breast cancer in her maternal aunt; Cancer in her maternal aunts; Cervical cancer in her maternal aunt; Heart attack in her father; Heart disease in her brother and father; and Heart failure in her father.  There is no history of Colon cancer. Allergies  Allergen Reactions  . Codeine Other (See Comments)    Caused possible hallucinations and anxiety  . Latex Rash  . Neomycin-Bacitracin Zn-Polymyx Itching and Rash  . Penicillins Swelling    Lips      Outpatient Encounter Prescriptions as of 12/28/2011  Medication Sig Dispense Refill  . acetaminophen (TYLENOL) 500 MG tablet Take 500 mg by mouth every 6 (six) hours as needed.        Marland Kitchen albuterol (PROAIR HFA) 108 (90 BASE) MCG/ACT inhaler Inhale 2 puffs into the lungs every 4 (four) hours as needed for wheezing.  1 Inhaler  1  . Ascorbic Acid (VITAMIN C) 500 MG tablet Take 500 mg by mouth daily.        Marland Kitchen aspirin 325 MG tablet Take 325 mg by mouth daily.        . calcium carbonate (OS-CAL) 600 MG TABS Take 600 mg by mouth 3 (three) times daily with meals.        Gasper Lloyd  20 MG tablet TAKE 1 TABLET BY MOUTH EVERY DAY  30 tablet  5  . diclofenac (VOLTAREN) 75 MG EC tablet Take 1 tablet (75 mg total) by mouth 2 (two) times daily as needed (arthritis pain).  60 tablet  1  . dronedarone (MULTAQ) 400 MG tablet Take 1 tablet (400 mg total) by mouth 2 (two) times daily with a meal.  60 tablet  11  . fluticasone (FLONASE) 50 MCG/ACT nasal spray Place 2 sprays into the nose daily.  16 g  2  . ibandronate (BONIVA) 150 MG tablet Take 150 mg by mouth every 30 (thirty) days. Take in the morning with a full glass of water, on an empty stomach, and do not take anything else by mouth or lie down for the next 30 min.       . lansoprazole (PREVACID) 30 MG capsule Take 1 capsule (30 mg total) by mouth daily.  30  capsule  1  . levETIRAcetam (KEPPRA) 500 MG tablet Take 1 tablet (500 mg total) by mouth 2 (two) times daily.  60 tablet  1  . loperamide (IMODIUM A-D) 2 MG tablet Take 2 mg by mouth as needed.        . methocarbamol (ROBAXIN) 500 MG tablet Take 500 mg by mouth as needed.       . metoprolol succinate (TOPROL-XL) 50 MG 24 hr tablet TAKE 1 TABLET BY MOUTH EVERY DAY  30 tablet  5  . Multiple Vitamin (MULTIVITAMIN) tablet Take 1 tablet by mouth daily.        Marland Kitchen NITROSTAT 0.4 MG SL tablet DISSOLVE 1 TABLET UNDER THE TONGUE EVERY 5 MINUTES AS NEEDED FOR CHEST PAIN, MAY REPEAT THREE TIMES  25 tablet  3  . PHENObarbital (LUMINAL) 60 MG tablet TAKE 1 TABLET BY MOUTH TWICE DAILY  60 tablet  2  . potassium chloride (K-DUR,KLOR-CON) 10 MEQ tablet TAKE 1 TABLET BY MOUTH DAILY  30 tablet  10  . QVAR 40 MCG/ACT inhaler INHALE 2 PUFFS BY MOUTH TWICE DAILY  1 Inhaler  6  . sodium chloride (OCEAN) 0.65 % nasal spray 3 sprays by Nasal route 3 (three) times daily.       Marland Kitchen tiZANidine (ZANAFLEX) 4 MG tablet Take 1 tablet (4 mg total) by mouth every 6 (six) hours as needed.  30 tablet  3     REVIEW OF SYSTEMS  :  Positive for anxiety, arthritis, back pain, vision changes, fatigue, swelling of legs, for voice. changes. All other systems reviewed and negative except where noted in the History of Present Illness.   PHYSICAL EXAM: BP 110/60  Pulse 66  Ht 5' 3.25" (1.607 m)  Wt 170 lb 9.6 oz (77.384 kg)  BMI 29.98 kg/m2 General: Well developed white female in no acute distress Head: Normocephalic and atraumatic Eyes:  sclerae anicteric,conjunctive pink. Ears: Normal auditory acuity Mouth: No deformity or lesions Neck: Supple, no masses.  Lungs: Clear throughout to auscultation Heart: Regular rate and rhythm Abdomen: Soft, non distended, mild epigastric tenderness  No masses or hepatomegaly noted. Normal Bowel sounds Rectal: Not done Musculoskeletal: Symmetrical with no gross deformities. Left anterior rib cage  tenderness, left chest wall tenderness  Skin: No lesions on visible extremities Extremities: No edema or deformities noted Neurological: Alert oriented x 4, grossly nonfocal Cervical Nodes:  No significant cervical adenopathy Psychological:  Alert and cooperative. Normal mood and affect  ASSESSMENT AND PLAN: 78. 71 year old female with multiple gastrointestinal issues including bloating, belching, intermittent nausea, and  what she describes as left upper abdominal pain though by exam it appears to be more musculoskeletal in nature. Patient takes Voltaren as well as daily aspirin, she is certainly at risk for a gastric ulcer. For further evaluation patient will be scheduled for upper endoscopy.  The benefits, risks, and potential complications of EGD with possible biopsies  were discussed with the patient and she agrees to proceed. For now I will obtain some basic labs and ask that she avoid all anti-inflammatories until after EGD.  2. loose stool, not sure frequency nor quantity enough to qualify for diarrhea. One possibility (though remote) is the recent change in PPI. Patient on Prevacid 30 mg, she was on 15 mg before. The patient has been on other PPIs, I have seen more bowel changes associated with prevacid however. Patient will either hold Prevacid for a couple of days or decrease to 15 mg daily to see if it makes any difference in her stool consistency. Further workup depending on clinical course.

## 2011-12-28 NOTE — Patient Instructions (Addendum)
Please go to the basement level to have your labs drawn.  We have scheduled the [place] with Dr. Yancey Flemings. Directions provided.  Decrease the Prevacid to 15 mg daily to see if it makes a difference with your loose stools.

## 2012-01-13 ENCOUNTER — Telehealth: Payer: Self-pay | Admitting: *Deleted

## 2012-01-13 DIAGNOSIS — Z79899 Other long term (current) drug therapy: Secondary | ICD-10-CM

## 2012-01-13 NOTE — Telephone Encounter (Signed)
Pease ask pharmacy for suggested alternative

## 2012-01-13 NOTE — Telephone Encounter (Signed)
Received fax stating pt has rx for Phenobarbital 60 mg, and it is currently unavailable from the manufacturer with no release date. Pls advise if md would like to switch this to an alternative.Marland KitchenMarland Kitchen8/14/13@8 :18am/LMB

## 2012-01-18 ENCOUNTER — Encounter: Payer: Self-pay | Admitting: Internal Medicine

## 2012-01-18 ENCOUNTER — Ambulatory Visit (AMBULATORY_SURGERY_CENTER): Payer: Medicare Other | Admitting: Internal Medicine

## 2012-01-18 VITALS — BP 145/75 | HR 74 | Temp 96.7°F | Resp 14 | Ht 63.0 in | Wt 170.0 lb

## 2012-01-18 DIAGNOSIS — K219 Gastro-esophageal reflux disease without esophagitis: Secondary | ICD-10-CM

## 2012-01-18 DIAGNOSIS — R198 Other specified symptoms and signs involving the digestive system and abdomen: Secondary | ICD-10-CM

## 2012-01-18 DIAGNOSIS — R1012 Left upper quadrant pain: Secondary | ICD-10-CM

## 2012-01-18 DIAGNOSIS — R110 Nausea: Secondary | ICD-10-CM

## 2012-01-18 MED ORDER — PHENOBARBITAL 64.8 MG PO TABS: 64.8000 mg | ORAL_TABLET | Freq: Two times a day (BID) | ORAL | Status: AC

## 2012-01-18 MED ORDER — SODIUM CHLORIDE 0.9 % IV SOLN: 500.0000 mL | INTRAVENOUS | Status: DC

## 2012-01-18 NOTE — Patient Instructions (Signed)
NORMAL EXAM TODAY! PROBIOTIC ALIGN DAILY FOR 4 WEEKS. SAMPLES GIVEN TODAY. FOLLOW UP AS NEEDED. THANK YOU!  YOU HAD AN ENDOSCOPIC PROCEDURE TODAY AT THE [facility]: Refer to the procedure report that was given to you for any specific questions about what was found during the examination.  If the procedure report does not answer your questions, please call your gastroenterologist to clarify.  If you requested that your care partner not be given the details of your procedure findings, then the procedure report has been included in a sealed envelope for you to review at your convenience later.  YOU SHOULD EXPECT: Some feelings of bloating in the abdomen. Passage of more gas than usual.  Walking can help get rid of the air that was put into your GI tract during the procedure and reduce the bloating. If you had a lower endoscopy (such as a colonoscopy or flexible sigmoidoscopy) you may notice spotting of blood in your stool or on the toilet paper. If you underwent a bowel prep for your procedure, then you may not have a normal bowel movement for a few days.  DIET: Your first meal following the procedure should be a light meal and then it is ok to progress to your normal diet.  A half-sandwich or bowl of soup is an example of a good first meal.  Heavy or fried foods are harder to digest and may make you feel nauseous or bloated.  Likewise meals heavy in dairy and vegetables can cause extra gas to form and this can also increase the bloating.  Drink plenty of fluids but you should avoid alcoholic beverages for 24 hours.  ACTIVITY: Your care partner should take you home directly after the procedure.  You should plan to take it easy, moving slowly for the rest of the day.  You can resume normal activity the day after the procedure however you should NOT DRIVE or use heavy machinery for 24 hours (because of the sedation medicines used during the test).    SYMPTOMS TO REPORT IMMEDIATELY: A  gastroenterologist can be reached at any hour.  During normal business hours, 8:30 AM to 5:00 PM Monday through Friday, call [phone].  After hours and on weekends, please call the GI answering service at [phone] who will take a message and have the physician on call contact you.   Following upper endoscopy (EGD)  Vomiting of blood or coffee ground material  New chest pain or pain under the shoulder blades  Painful or persistently difficult swallowing  New shortness of breath  Fever of 100F or higher  Black, tarry-looking stools  FOLLOW UP: If any biopsies were taken you will be contacted by phone or by letter within the next 1-3 weeks.  Call your gastroenterologist if you have not heard about the biopsies in 3 weeks.  Our staff will call the home number listed on your records the next business day following your procedure to check on you and address any questions or concerns that you may have at that time regarding the information given to you following your procedure. This is a courtesy call and so if there is no answer at the home number and we have not heard from you through the emergency physician on call, we will assume that you have returned to your regular daily activities without incident.  SIGNATURES/CONFIDENTIALITY: You and/or your care partner have signed paperwork which will be entered into your electronic medical record.  These signatures attest to the fact  that that the information above on your After Visit Summary has been reviewed and is understood.  Full responsibility of the confidentiality of this discharge information lies with you and/or your care-partner.

## 2012-01-18 NOTE — Progress Notes (Signed)
Patient did not experience any of the following events: a burn prior to discharge; a fall within the facility; wrong site/side/patient/procedure/implant event; or a hospital transfer or hospital admission upon discharge from the facility. (G8907) Patient did not have preoperative order for IV antibiotic SSI prophylaxis. (G8918)  

## 2012-01-18 NOTE — Telephone Encounter (Signed)
Called the patient left message to call back 

## 2012-01-18 NOTE — Telephone Encounter (Signed)
Called pharmacy back she states they only have phenobarbital 64.8 mg in stock. If ok to change will need new script,,,, 01/18/12@3 :56pm/LMB

## 2012-01-18 NOTE — Op Note (Signed)
[facility] [address] [place], [address]   ENDOSCOPY PROCEDURE REPORT  PATIENT: Misty Meyer, Misty Meyer  MR#: [id number] BIRTHDATE: [birth year] , 70  yrs. old GENDER: Female ENDOSCOPIST: Roxy Cedar, MD REFERRED BY:  Office PROCEDURE DATE:  01/18/2012 PROCEDURE:  EGD, diagnostic ASA CLASS:     Class II INDICATIONS:  abdominal pain in upper left quadrant. MEDICATIONS: MAC sedation, administered by CRNA and propofol (Diprivan) 80mg  IV TOPICAL ANESTHETIC: Cetacaine Spray  DESCRIPTION OF PROCEDURE: After the risks benefits and alternatives of the procedure were thoroughly explained, informed consent was obtained.  The LB-GIF Q180 [id number] endoscope was introduced through the mouth and advanced to the second portion of the duodenum , limited by Without limitations. The instrument was slowly withdrawn as the mucosa was fully examined.    Normal EGD.  Retroflexed views revealed no abnormalities.     The scope was then withdrawn from the patient and the procedure completed.  COMPLICATIONS: There were no complications.  ENDOSCOPIC IMPRESSION: Normal EGD GERD  RECOMMENDATIONS: 1. Probiotic Align one daily for 4 weeks 2. OP follow-up is advised on a PRN basis.    _______________________________ eSignedRoxy Cedar, MD 01/18/2012 4:34 PM     XB:[id number] Mervyn Skeeters Felicity Coyer, MD The Patient

## 2012-01-18 NOTE — Telephone Encounter (Signed)
Ok to change; has not had phenobarb level in some time  Robin to let pt know as well - needs phenobarb level in 10-14 days after start new med

## 2012-01-19 ENCOUNTER — Telehealth: Payer: Self-pay

## 2012-01-19 ENCOUNTER — Ambulatory Visit: Payer: Medicare Other | Admitting: Internal Medicine

## 2012-01-19 ENCOUNTER — Ambulatory Visit: Payer: Medicare Other | Admitting: Cardiology

## 2012-01-19 NOTE — Telephone Encounter (Signed)
Called the patient informed faxed hardcopy to pharmacy.

## 2012-01-19 NOTE — Telephone Encounter (Signed)
  Follow up Call-  Call back number 01/18/2012  Post procedure Call Back phone  # [phone]  Permission to leave phone message Yes     Patient questions:  Do you have a fever, pain , or abdominal swelling? no Pain Score  0 *  Have you tolerated food without any problems? yes  Have you been able to return to your normal activities? yes  Do you have any questions about your discharge instructions: Diet   no Medications  no Follow up visit  no  Do you have questions or concerns about your Care? no  Actions: * If pain score is 4 or above: No action needed, pain <4.

## 2012-01-26 ENCOUNTER — Other Ambulatory Visit: Payer: Self-pay | Admitting: Critical Care Medicine

## 2012-01-28 ENCOUNTER — Telehealth: Payer: Self-pay | Admitting: Internal Medicine

## 2012-01-28 NOTE — Telephone Encounter (Signed)
Pt called wanting to know the results of the labs she had done at the end of July. Per Willette Cluster NP the labs are stable. Pt aware.

## 2012-01-31 ENCOUNTER — Other Ambulatory Visit: Payer: Self-pay | Admitting: Internal Medicine

## 2012-02-02 ENCOUNTER — Encounter: Payer: Self-pay | Admitting: Internal Medicine

## 2012-02-02 ENCOUNTER — Ambulatory Visit (INDEPENDENT_AMBULATORY_CARE_PROVIDER_SITE_OTHER): Payer: Medicare Other | Admitting: Internal Medicine

## 2012-02-02 VITALS — BP 110/62 | HR 87 | Temp 98.6°F | Ht 64.0 in | Wt 165.8 lb

## 2012-02-02 DIAGNOSIS — M1990 Unspecified osteoarthritis, unspecified site: Secondary | ICD-10-CM

## 2012-02-02 DIAGNOSIS — K589 Irritable bowel syndrome without diarrhea: Secondary | ICD-10-CM

## 2012-02-02 DIAGNOSIS — G40909 Epilepsy, unspecified, not intractable, without status epilepticus: Secondary | ICD-10-CM

## 2012-02-02 DIAGNOSIS — I4891 Unspecified atrial fibrillation: Secondary | ICD-10-CM

## 2012-02-02 NOTE — Assessment & Plan Note (Signed)
Long history of petit mal seizures> episode of grand mal event June 2012 Follows with neurology Dr. Anne Hahn for same Medications adjusted following grand mal seizure> no recurrent breakthrough seizures since that time The current medical regimen is effective;  continue present plan and medications.

## 2012-02-02 NOTE — Assessment & Plan Note (Signed)
On Multaq > maintaining normal sinus occassionally symptomatic - ?irreg palpitations at times (but now improved) On aspirin therapy, as pt high-risk for Coumadin because of seizure disorder Follows with cardiology for same, no current symptoms

## 2012-02-02 NOTE — Patient Instructions (Signed)
It was good to see you today. We have reviewed your interval history including labs and tests today Medications reviewed and updated - no changes at this time. Continue working with your specialists as reviewed today Please schedule followup in 6 months, call sooner if problems.
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
## 2012-02-02 NOTE — Assessment & Plan Note (Signed)
Diarrhea prone, long history of same known Again reviewed symptoms and diagnosis with patient today 

## 2012-02-02 NOTE — Progress Notes (Signed)
  Subjective:    Patient ID: Misty Meyer, female    DOB: 1940/12/30, 71 y.o.   MRN: [id number]  GI Problem Primary symptoms do not include fever or fatigue.  The illness does not include constipation.  complains of chronic "diarrhea" - describes loose stools due to PPI - No liquid stool or incontinence of stool Various type of PPIs tried No BRBPR or melena, no abdominal pain or fever last colo 2011, EGD 12/2011 benign On Align - ?improved  Also reviewed chronic medical issues and reviewed numerous medicine questions   Past Medical History  Diagnosis Date  . CAD (coronary artery disease)     nonobst cath 12/2008  . HLD (hyperlipidemia)   . Calculus of gallbladder without mention of cholecystitis or obstruction 03/2011    s/p lap chole  . IBS (irritable bowel syndrome)   . GERD (gastroesophageal reflux disease)   . Tinnitus   . Seizure disorder     Grand mal seizure 7/12, [facility]  . Anemia   . Allergic rhinitis, cause unspecified   . Major depressive disorder, single episode, severe, without mention of psychotic behavior   . Arthritis of back   . Atrial fibrillation     on Multaq  . Asthma   . Osteoporosis     DEXA 04/2011: -3.1 L fem, on Boniva    Review of Systems  Constitutional: Negative for fever, fatigue and unexpected weight change.  Respiratory: Negative for cough and shortness of breath.   Gastrointestinal: Negative for constipation and blood in stool.       Objective:   Physical Exam  BP 110/62  Pulse 87  Temp 98.6 F (37 C) (Oral)  Ht 5\' 4"  (1.626 m)  Wt 165 lb 12.8 oz (75.206 kg)  BMI 28.46 kg/m2  SpO2 95% Wt Readings from Last 3 Encounters:  02/02/12 165 lb 12.8 oz (75.206 kg)  01/18/12 170 lb (77.111 kg)  12/28/11 170 lb 9.6 oz (77.384 kg)   Constitutional: She appears well-developed and well-nourished. No distress.  Neck: Normal range of motion. Neck supple. No JVD present. No thyromegaly present.  Cardiovascular: normal rate,  irregular rhythm, normal sounds.  No murmur heard. No BLE edema. Pulmonary/Chest: Effort normal and breath sounds normal. No respiratory distress. She has no wheezes.  Abdominal: Soft. Bowel sounds are normal. She exhibits no distension. There is no tenderness. no masses  Lab Results  Component Value Date   WBC 8.4 12/28/2011   HGB 12.9 12/28/2011   HCT 38.4 12/28/2011   PLT 182.0 12/28/2011   GLUCOSE 93 12/28/2011   CHOL 138 06/08/2011   TRIG 69.0 06/08/2011   HDL 55.60 06/08/2011   LDLDIRECT 137.0 10/23/2008   LDLCALC 69 06/08/2011   ALT 29 12/28/2011   AST 23 12/28/2011   NA 139 12/28/2011   K 4.4 12/28/2011   CL 106 12/28/2011   CREATININE 1.1 12/28/2011   BUN 26* 12/28/2011   CO2 27 12/28/2011   TSH 0.37 06/23/2011   INR 1.0 01/03/2009       Assessment & Plan:  See problem list. Medications and labs reviewed today.  Time spent with pt today 25 minutes, greater than 50% time spent counseling patient on chronic medical issues and medication review. Also review of interval records

## 2012-02-08 ENCOUNTER — Other Ambulatory Visit: Payer: Self-pay

## 2012-02-08 MED ORDER — LANSOPRAZOLE 15 MG PO CPDR: 15.0000 mg | DELAYED_RELEASE_CAPSULE | Freq: Every day | ORAL | Status: DC

## 2012-02-09 ENCOUNTER — Encounter: Payer: Self-pay | Admitting: Cardiology

## 2012-02-09 ENCOUNTER — Ambulatory Visit (INDEPENDENT_AMBULATORY_CARE_PROVIDER_SITE_OTHER): Payer: Medicare Other | Admitting: Cardiology

## 2012-02-09 VITALS — BP 110/64 | HR 73 | Ht 63.0 in | Wt 167.4 lb

## 2012-02-09 DIAGNOSIS — Z79899 Other long term (current) drug therapy: Secondary | ICD-10-CM

## 2012-02-09 DIAGNOSIS — I2510 Atherosclerotic heart disease of native coronary artery without angina pectoris: Secondary | ICD-10-CM

## 2012-02-09 DIAGNOSIS — I4891 Unspecified atrial fibrillation: Secondary | ICD-10-CM

## 2012-02-09 DIAGNOSIS — I25810 Atherosclerosis of coronary artery bypass graft(s) without angina pectoris: Secondary | ICD-10-CM

## 2012-02-09 LAB — BASIC METABOLIC PANEL
BUN: 21 mg/dL (ref 6–23)
CO2: 27 mEq/L (ref 19–32)
Chloride: 108 mEq/L (ref 96–112)
Glucose, Bld: 87 mg/dL (ref 70–99)
Potassium: 3.8 mEq/L (ref 3.5–5.1)

## 2012-02-09 NOTE — Progress Notes (Signed)
HPI:  The patient really is doing quite well, and is not having any ongoing issues. She's been followed by Dr. Anne Hahn in the neurology clinic, and he is following her seizure meds.  No current chest pain, and no new symptoms of rhythm issues.  Diarrhea seems improved since she was started on Align.    Current Outpatient Prescriptions  Medication Sig Dispense Refill  . acetaminophen (TYLENOL) 500 MG tablet Take 500 mg by mouth every 6 (six) hours as needed.        Marland Kitchen albuterol (PROAIR HFA) 108 (90 BASE) MCG/ACT inhaler Inhale 2 puffs into the lungs every 4 (four) hours as needed for wheezing.  1 Inhaler  1  . aspirin 325 MG tablet Take 325 mg by mouth daily.        . CRESTOR 20 MG tablet TAKE 1 TABLET BY MOUTH EVERY DAY  30 tablet  5  . dronedarone (MULTAQ) 400 MG tablet Take 1 tablet (400 mg total) by mouth 2 (two) times daily with a meal.  60 tablet  11  . fluticasone (FLONASE) 50 MCG/ACT nasal spray Place 2 sprays into the nose daily.  16 g  2  . ibandronate (BONIVA) 150 MG tablet Take 150 mg by mouth every 30 (thirty) days. Take in the morning with a full glass of water, on an empty stomach, and do not take anything else by mouth or lie down for the next 30 min.       . lansoprazole (PREVACID) 15 MG capsule Take 1 capsule (15 mg total) by mouth daily.  30 capsule  5  . levETIRAcetam (KEPPRA) 500 MG tablet Take 1 tablet (500 mg total) by mouth 2 (two) times daily.  60 tablet  1  . loperamide (IMODIUM A-D) 2 MG tablet Take 2 mg by mouth as needed.        . methocarbamol (ROBAXIN) 500 MG tablet Take 500 mg by mouth as needed.       . metoprolol succinate (TOPROL-XL) 50 MG 24 hr tablet TAKE 1 TABLET BY MOUTH EVERY DAY  30 tablet  5  . NITROSTAT 0.4 MG SL tablet DISSOLVE 1 TABLET UNDER THE TONGUE EVERY 5 MINUTES AS NEEDED FOR CHEST PAIN, MAY REPEAT THREE TIMES  25 tablet  3  . PHENobarbital (LUMINAL) 64.8 MG tablet Take 1 tablet (64.8 mg total) by mouth 2 (two) times daily.  180 tablet  3  .  potassium chloride (K-DUR,KLOR-CON) 10 MEQ tablet TAKE 1 TABLET BY MOUTH DAILY  30 tablet  10  . Probiotic Product (ALIGN PO) Take 1 tablet by mouth daily.      Marland Kitchen QVAR 40 MCG/ACT inhaler INHALE 2 PUFFS BY MOUTH TWICE DAILY  8.7 g  1  . sodium chloride (OCEAN) 0.65 % nasal spray 3 sprays by Nasal route 3 (three) times daily.       Marland Kitchen tiZANidine (ZANAFLEX) 4 MG tablet Take 1 tablet (4 mg total) by mouth every 6 (six) hours as needed.  30 tablet  3  . Ascorbic Acid (VITAMIN C) 500 MG tablet Take 500 mg by mouth daily.        . calcium carbonate (OS-CAL) 600 MG TABS Take 600 mg by mouth 3 (three) times daily with meals.        . diclofenac (VOLTAREN) 75 MG EC tablet Take 1 tablet (75 mg total) by mouth 2 (two) times daily as needed (arthritis pain).  60 tablet  1  . Multiple Vitamin (MULTIVITAMIN) tablet Take  1 tablet by mouth daily.        Marland Kitchen DISCONTD: potassium chloride (KLOR-CON) 10 MEQ CR tablet Take 1 tablet (10 mEq total) by mouth daily.  30 tablet  11    Allergies  Allergen Reactions  . Codeine Other (See Comments)    Caused possible hallucinations and anxiety  . Latex Rash  . Neomycin-Bacitracin Zn-Polymyx Itching and Rash  . Penicillins Swelling    Lips    Past Medical History  Diagnosis Date  . CAD (coronary artery disease)     nonobst cath 12/2008  . HLD (hyperlipidemia)   . Calculus of gallbladder without mention of cholecystitis or obstruction 03/2011    s/p lap chole  . IBS (irritable bowel syndrome)   . GERD (gastroesophageal reflux disease)   . Tinnitus   . Seizure disorder     Grand mal seizure 7/12, [facility]  . Anemia   . Allergic rhinitis, cause unspecified   . Major depressive disorder, single episode, severe, without mention of psychotic behavior   . Arthritis of back   . Atrial fibrillation     on Multaq  . Asthma   . Osteoporosis     DEXA 04/2011: -3.1 L fem, on Boniva    Past Surgical History  Procedure Date  . Total abdominal hysterectomy   .  Wrist fracture surgery     with plates (left)  . Tonsillectomy     age 5  . Cholecystectomy 03/25/2011    Family History  Problem Relation Age of Onset  . Angina Mother   . Alzheimer's disease Mother   . Heart attack Father   . Heart failure Father   . Heart disease Father   . Heart disease Brother   . Breast cancer Maternal Aunt   . Cancer Maternal Aunt     breast  . Cervical cancer Maternal Aunt   . Cancer Maternal Aunt     ovarian  . Colon cancer Neg Hx     History   Social History  . Marital Status: Divorced    Spouse Name: N/A    Number of Children: 0  . 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     Social History Main Topics  . Smoking status: Never Smoker   . Smokeless tobacco: Never Used  . Alcohol Use: No     quit 2012  . Drug Use: No  . Sexually Active: No   Other Topics Concern  . Not on file   Social History Narrative  . No narrative on file    ROS: Please see the HPI.  All other systems reviewed and negative.  PHYSICAL EXAM:  BP 110/64  Pulse 73  Ht 5\' 3"  (1.6 m)  Wt 167 lb 6.4 oz (75.932 kg)  BMI 29.65 kg/m2  General: Well developed, well nourished, in no acute distress. Head:  Normocephalic and atraumatic. Neck: no JVD Lungs: Clear to auscultation and percussion. Heart: Normal S1 and S2.  No murmur, rubs or gallops.  Abdomen:  Normal bowel sounds; soft; non tender; no organomegaly Pulses: Pulses normal in all 4 extremities. Extremities: No clubbing or cyanosis. No edema. Neurologic: Alert and oriented x 3.  EKG:  NSR.  Diffuse T wave flattening.  Slightly prominent P.  No acute changes.    ASSESSMENT AND PLAN:

## 2012-02-09 NOTE — Patient Instructions (Addendum)
Your physician wants you to follow-up in:  4 MONTHS WITH DR  Fredrik Cove will receive a reminder letter in the mail two months in advance. If you don't receive a letter, please call our office to schedule the follow-up appointment. Your physician recommends that you continue on your current medications as directed. Please refer to the Current Medication list given to you today. Your physician recommends that you return for lab work in:  TODAY BMET   HGBA1C  DX   CAD

## 2012-02-10 ENCOUNTER — Ambulatory Visit (INDEPENDENT_AMBULATORY_CARE_PROVIDER_SITE_OTHER): Payer: Medicare Other | Admitting: Critical Care Medicine

## 2012-02-10 ENCOUNTER — Encounter: Payer: Self-pay | Admitting: Critical Care Medicine

## 2012-02-10 VITALS — BP 114/64 | HR 73 | Temp 97.9°F | Ht 63.0 in | Wt 167.4 lb

## 2012-02-10 DIAGNOSIS — Z23 Encounter for immunization: Secondary | ICD-10-CM

## 2012-02-10 DIAGNOSIS — J45909 Unspecified asthma, uncomplicated: Secondary | ICD-10-CM

## 2012-02-10 MED ORDER — BECLOMETHASONE DIPROPIONATE 40 MCG/ACT IN AERS: 2.0000 | INHALATION_SPRAY | Freq: Two times a day (BID) | RESPIRATORY_TRACT | Status: AC

## 2012-02-10 NOTE — Progress Notes (Signed)
Subjective:    Patient ID: Misty Meyer, female    DOB: Aug 23, 1940, 71 y.o.   MRN: [id number]  HPI  71 y.o. female with known hx of asthmatic bronchitis, abn CXR with LLL scar, ? MAC ? TB in past.    02/10/2012 Not seen since 12/2010.  Pt had EGD 12/2011 Marina Goodell:  Newton Pigg causing gastritis? But EGD neg. Pt has stopped the Voltaren.  Pt still with stomach issues.  Just saw [facility]. ECG abn. Not using proair. Doing well with cough and dyspnea. Pt denies any significant sore throat, nasal congestion or excess secretions, fever, chills, sweats, unintended weight loss, pleurtic or exertional chest pain, orthopnea PND, or leg swelling Pt denies any increase in rescue therapy over baseline, denies waking up needing it or having any early am or nocturnal exacerbations of coughing/wheezing/or dyspnea. Pt also denies any obvious fluctuation in symptoms with  weather or environmental change or other alleviating or aggravating factors  PUL ASTHMA HISTORY 02/10/2012  Symptoms 0-2 days/week  Nighttime awakenings 0-2/month  Interference with activity No limitations  SABA use 0-2 days/wk  Exacerbations requiring oral steroids 0-1 / year    Past Medical History  Diagnosis Date  . CAD (coronary artery disease)     nonobst cath 12/2008  . HLD (hyperlipidemia)   . Calculus of gallbladder without mention of cholecystitis or obstruction 03/2011    s/p lap chole  . IBS (irritable bowel syndrome)   . GERD (gastroesophageal reflux disease)   . Tinnitus   . Seizure disorder     Grand mal seizure 7/12, [facility]  . Anemia   . Allergic rhinitis, cause unspecified   . Major depressive disorder, single episode, severe, without mention of psychotic behavior   . Arthritis of back   . Atrial fibrillation     on Multaq  . Asthma   . Osteoporosis     DEXA 04/2011: -3.1 L fem, on Boniva     Family History  Problem Relation Age of Onset  . Angina Mother   . Alzheimer's disease Mother   . Heart  attack Father   . Heart failure Father   . Heart disease Father   . Heart disease Brother   . Breast cancer Maternal Aunt   . Cancer Maternal Aunt     breast  . Cervical cancer Maternal Aunt   . Cancer Maternal Aunt     ovarian  . Colon cancer Neg Hx      History   Social History  . Marital Status: Divorced    Spouse Name: N/A    Number of Children: 0  . 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     Social History Main Topics  . Smoking status: Never Smoker   . Smokeless tobacco: Never Used  . Alcohol Use: No     quit 2012  . Drug Use: No  . Sexually Active: No   Other Topics Concern  . Not on file   Social History Narrative  . No narrative on file     Allergies  Allergen Reactions  . Codeine Other (See Comments)    Caused possible hallucinations and anxiety  . Latex Rash  . Neomycin-Bacitracin Zn-Polymyx Itching and Rash  . Penicillins Swelling    Lips     Outpatient Prescriptions Prior to Visit  Medication Sig Dispense Refill  . acetaminophen (TYLENOL) 500 MG tablet Take 500 mg by mouth every  6 (six) hours as needed.        Marland Kitchen albuterol (PROAIR HFA) 108 (90 BASE) MCG/ACT inhaler Inhale 2 puffs into the lungs every 4 (four) hours as needed for wheezing.  1 Inhaler  1  . aspirin 325 MG tablet Take 325 mg by mouth daily.        . CRESTOR 20 MG tablet TAKE 1 TABLET BY MOUTH EVERY DAY  30 tablet  5  . dronedarone (MULTAQ) 400 MG tablet Take 1 tablet (400 mg total) by mouth 2 (two) times daily with a meal.  60 tablet  11  . fluticasone (FLONASE) 50 MCG/ACT nasal spray Place 2 sprays into the nose daily.  16 g  2  . ibandronate (BONIVA) 150 MG tablet Take 150 mg by mouth every 30 (thirty) days. Take in the morning with a full glass of water, on an empty stomach, and do not take anything else by mouth or lie down for the next 30 min.       . lansoprazole (PREVACID) 15 MG capsule Take 1 capsule (15 mg total) by  mouth daily.  30 capsule  5  . levETIRAcetam (KEPPRA) 500 MG tablet Take 1 tablet (500 mg total) by mouth 2 (two) times daily.  60 tablet  1  . loperamide (IMODIUM A-D) 2 MG tablet Take 2 mg by mouth as needed.        . metoprolol succinate (TOPROL-XL) 50 MG 24 hr tablet TAKE 1 TABLET BY MOUTH EVERY DAY  30 tablet  5  . NITROSTAT 0.4 MG SL tablet DISSOLVE 1 TABLET UNDER THE TONGUE EVERY 5 MINUTES AS NEEDED FOR CHEST PAIN, MAY REPEAT THREE TIMES  25 tablet  3  . PHENobarbital (LUMINAL) 64.8 MG tablet Take 1 tablet (64.8 mg total) by mouth 2 (two) times daily.  180 tablet  3  . potassium chloride (K-DUR,KLOR-CON) 10 MEQ tablet TAKE 1 TABLET BY MOUTH DAILY  30 tablet  10  . Probiotic Product (ALIGN PO) Take 1 tablet by mouth daily.      . sodium chloride (OCEAN) 0.65 % nasal spray 3 sprays by Nasal route 3 (three) times daily.       Marland Kitchen tiZANidine (ZANAFLEX) 4 MG tablet Take 1 tablet (4 mg total) by mouth every 6 (six) hours as needed.  30 tablet  3  . QVAR 40 MCG/ACT inhaler INHALE 2 PUFFS BY MOUTH TWICE DAILY  8.7 g  1  . Ascorbic Acid (VITAMIN C) 500 MG tablet Take 500 mg by mouth daily.        . calcium carbonate (OS-CAL) 600 MG TABS Take 600 mg by mouth 3 (three) times daily with meals.        . diclofenac (VOLTAREN) 75 MG EC tablet Take 1 tablet (75 mg total) by mouth 2 (two) times daily as needed (arthritis pain).  60 tablet  1  . methocarbamol (ROBAXIN) 500 MG tablet Take 500 mg by mouth as needed.       . Multiple Vitamin (MULTIVITAMIN) tablet Take 1 tablet by mouth daily.            Review of Systems  Constitutional:   No  weight loss, night sweats,  Fevers, chills, fatigue, lassitude. HEENT:   No headaches,  Difficulty swallowing,  Tooth/dental problems,  Sore throat,                No sneezing, itching, ear ache, nasal congestion, post nasal drip,   CV:  No chest pain,  Orthopnea, PND, swelling in lower extremities, anasarca, dizziness, palpitations  GI  No heartburn, indigestion,  abdominal pain, nausea, vomiting, diarrhea, change in bowel habits, loss of appetite  Resp: Notes  shortness of breath with exertion not  at rest.  No excess mucus, no productive cough,  Notes  non-productive cough,  No coughing up of blood.  No change in color of mucus.  No wheezing.  No chest wall deformity  Skin: no rash or lesions.  GU: no dysuria, change in color of urine, no urgency or frequency.  No flank pain.  MS:  No joint pain or swelling.  No decreased range of motion.  No back pain.  Psych:  No change in mood or affect. No depression or anxiety.  No memory loss.     Objective:   Physical Exam  Gen: Pleasant, well-nourished, in no distress,  normal affect  ENT: No lesions,  mouth clear,  oropharynx clear, no postnasal drip  Neck: No JVD, no TMG, no carotid bruits  Lungs: No use of accessory muscles, no dullness to percussion, clear  Cardiovascular: RRR, heart sounds normal, no murmur or gallops, no peripheral edema  Abdomen: soft and NT, no HSM,  BS normal  Musculoskeletal: No deformities, no cyanosis or clubbing  Neuro: alert, non focal  Skin: Warm, no lesions or rashes      PFT Conversion 08/22/2009  FVC 1.98  FVC  % Predicted 64.4  FEV1 1.62  FEV1 PREDICT 2.34  FEV % Predicted 69.2  FEV1/FVC 81.8  FEV1/FVC%EXP 106.80  PEF % EXPECT 5.83    Assessment & Plan:   Extrinsic asthma, unspecified Moderate persistent asthma stable at this time Plan Continued inhaled medications as prescribed Flu vaccine today 02/10/2012     Updated Medication List Outpatient Encounter Prescriptions as of 02/10/2012  Medication Sig Dispense Refill  . acetaminophen (TYLENOL) 500 MG tablet Take 500 mg by mouth every 6 (six) hours as needed.        Marland Kitchen albuterol (PROAIR HFA) 108 (90 BASE) MCG/ACT inhaler Inhale 2 puffs into the lungs every 4 (four) hours as needed for wheezing.  1 Inhaler  1  . aspirin 325 MG tablet Take 325 mg by mouth daily.        . beclomethasone (QVAR)  40 MCG/ACT inhaler Inhale 2 puffs into the lungs 2 (two) times daily.  8.7 g  11  . CRESTOR 20 MG tablet TAKE 1 TABLET BY MOUTH EVERY DAY  30 tablet  5  . dronedarone (MULTAQ) 400 MG tablet Take 1 tablet (400 mg total) by mouth 2 (two) times daily with a meal.  60 tablet  11  . fluticasone (FLONASE) 50 MCG/ACT nasal spray Place 2 sprays into the nose daily.  16 g  2  . ibandronate (BONIVA) 150 MG tablet Take 150 mg by mouth every 30 (thirty) days. Take in the morning with a full glass of water, on an empty stomach, and do not take anything else by mouth or lie down for the next 30 min.       . lansoprazole (PREVACID) 15 MG capsule Take 1 capsule (15 mg total) by mouth daily.  30 capsule  5  . levETIRAcetam (KEPPRA) 500 MG tablet Take 1 tablet (500 mg total) by mouth 2 (two) times daily.  60 tablet  1  . loperamide (IMODIUM A-D) 2 MG tablet Take 2 mg by mouth as needed.        . metoprolol succinate (TOPROL-XL) 50 MG 24 hr tablet  TAKE 1 TABLET BY MOUTH EVERY DAY  30 tablet  5  . NITROSTAT 0.4 MG SL tablet DISSOLVE 1 TABLET UNDER THE TONGUE EVERY 5 MINUTES AS NEEDED FOR CHEST PAIN, MAY REPEAT THREE TIMES  25 tablet  3  . PHENobarbital (LUMINAL) 64.8 MG tablet Take 1 tablet (64.8 mg total) by mouth 2 (two) times daily.  180 tablet  3  . potassium chloride (K-DUR,KLOR-CON) 10 MEQ tablet TAKE 1 TABLET BY MOUTH DAILY  30 tablet  10  . Probiotic Product (ALIGN PO) Take 1 tablet by mouth daily.      . sodium chloride (OCEAN) 0.65 % nasal spray 3 sprays by Nasal route 3 (three) times daily.       Marland Kitchen tiZANidine (ZANAFLEX) 4 MG tablet Take 1 tablet (4 mg total) by mouth every 6 (six) hours as needed.  30 tablet  3  . DISCONTD: QVAR 40 MCG/ACT inhaler INHALE 2 PUFFS BY MOUTH TWICE DAILY  8.7 g  1  . DISCONTD: Ascorbic Acid (VITAMIN C) 500 MG tablet Take 500 mg by mouth daily.        Marland Kitchen DISCONTD: calcium carbonate (OS-CAL) 600 MG TABS Take 600 mg by mouth 3 (three) times daily with meals.        Marland Kitchen DISCONTD:  diclofenac (VOLTAREN) 75 MG EC tablet Take 1 tablet (75 mg total) by mouth 2 (two) times daily as needed (arthritis pain).  60 tablet  1  . DISCONTD: methocarbamol (ROBAXIN) 500 MG tablet Take 500 mg by mouth as needed.       Marland Kitchen DISCONTD: Multiple Vitamin (MULTIVITAMIN) tablet Take 1 tablet by mouth daily.

## 2012-02-10 NOTE — Assessment & Plan Note (Signed)
Moderate persistent asthma stable at this time Plan Continued inhaled medications as prescribed Flu vaccine today 02/10/2012

## 2012-02-10 NOTE — Patient Instructions (Addendum)
Flu vaccine today No change in medications Return 6 months 

## 2012-02-14 NOTE — Assessment & Plan Note (Signed)
She seems to be maintaining NSR.  Continue current regimen.

## 2012-02-14 NOTE — Assessment & Plan Note (Signed)
No new symptoms.

## 2012-02-15 ENCOUNTER — Other Ambulatory Visit: Payer: Self-pay | Admitting: Cardiology

## 2012-02-15 ENCOUNTER — Telehealth: Payer: Self-pay | Admitting: Internal Medicine

## 2012-02-15 NOTE — Telephone Encounter (Signed)
Left message on machine.

## 2012-02-16 ENCOUNTER — Encounter: Payer: Self-pay | Admitting: Cardiology

## 2012-02-16 NOTE — Telephone Encounter (Signed)
Pt rtn call to get results °

## 2012-02-16 NOTE — Addendum Note (Signed)
Addended by: Marrion Coy L on: 02/16/2012 11:28 AM   Modules accepted: Orders

## 2012-02-16 NOTE — Telephone Encounter (Signed)
This encounter was created in error - please disregard.

## 2012-03-03 ENCOUNTER — Telehealth: Payer: Self-pay | Admitting: Internal Medicine

## 2012-03-03 NOTE — Telephone Encounter (Signed)
Left message for pt to call back  °

## 2012-03-07 ENCOUNTER — Ambulatory Visit (INDEPENDENT_AMBULATORY_CARE_PROVIDER_SITE_OTHER): Payer: Medicare Other | Admitting: Nurse Practitioner

## 2012-03-07 ENCOUNTER — Encounter: Payer: Self-pay | Admitting: Nurse Practitioner

## 2012-03-07 ENCOUNTER — Ambulatory Visit (INDEPENDENT_AMBULATORY_CARE_PROVIDER_SITE_OTHER)
Admission: RE | Admit: 2012-03-07 | Discharge: 2012-03-07 | Disposition: A | Discharge status: Home / Self Care | Payer: Medicare Other | Source: Ambulatory Visit | Attending: Nurse Practitioner | Admitting: Nurse Practitioner

## 2012-03-07 VITALS — BP 110/68 | HR 64 | Ht 63.0 in | Wt 164.0 lb

## 2012-03-07 DIAGNOSIS — IMO0001 Reserved for inherently not codable concepts without codable children: Secondary | ICD-10-CM

## 2012-03-07 DIAGNOSIS — M7918 Myalgia, other site: Secondary | ICD-10-CM

## 2012-03-07 NOTE — Patient Instructions (Addendum)
Go to our Radiology department in the basement level.  We will call you with the results.

## 2012-03-07 NOTE — Progress Notes (Signed)
03/07/2012 Misty Meyer [id number] 06/18/1940   History of Present Illness:  Patient is a 71 year old female who I saw late July of this year for left upper quadrant pain, bloating, belching, nausea, gas and loose stool (please refer to that dictation). I decreased her Prevacid dose from 30 mg to 15 mg daily and her diarrhea has improved. Patient had an upper endoscopy for evaluation of above symptoms, the exam was normal. Post procedure, patient was given a course of align probiotic .    Patient comes in today for evaluation of left lateral rib and left lower back pain, most noticeable in evenings when sitting. Pain often alleviated with repositioning. It is not related to eating. Pain not aggravated by coughing or twisting . Her bowel movements are doing fairly well.  Patient has a history of costochondritis, she wonders if this may have extended to other areas of the rib cage. She brings in a brochure on diverticular disease, she wonders about its relationship to her pain.   Current Medications, Allergies, Past Medical History, Past Surgical History, Family History and Social History were reviewed in [facility] Link electronic medical record.   Physical Exam: General: Pleasant, well developed , white female in no acute distress Head: Normocephalic and atraumatic Eyes:  sclerae anicteric, conjunctiva pink  Ears: Normal auditory acuity Lungs: Clear throughout to auscultation Heart: Regular rate and rhythm Abdomen: Soft, non tender and non distended. No masses, no hepatomegaly. Normal bowel sounds Musculoskeletal: The distal sternum is tender to palpation. She has tenderness along the bottom of her left lateral and left posterioris thoracic cage.   Extremities: No edema  Neurological: Alert oriented x 4, grossly non focal Psychological:  Alert and cooperative. Normal mood and affect  Assessment and Recommendations:  Musculoskeletal pain. Patient has a history of costochondritis, she  is clearly tender over distal sternum. She has pain and tenderness involving in her left lateral thoracic cage and left posterior thoracic cage. She has been taking Voltaren but doesn't seem to help.She has muscle relaxers at home but they cause drowsiness. Will obtain left rib xray given that the new location of pain. Further evaluation / treatment will be deferred to PCP.

## 2012-03-07 NOTE — Telephone Encounter (Signed)
Pt states she has been having some discomfort in her LLQ and is c/o diarrhea. Requesting an appt. Pt scheduled to see Willette Cluster NP today at 2:30pm. Pt aware of appt date and time.

## 2012-03-09 ENCOUNTER — Encounter: Payer: Self-pay | Admitting: Nurse Practitioner

## 2012-03-09 NOTE — Progress Notes (Signed)
Agree with assessment and plan. Misty Meyer we'll followup on x-ray. Then return to primary care provider for further assessment and treatment of non-GI pain

## 2012-03-10 ENCOUNTER — Telehealth: Payer: Self-pay | Admitting: Nurse Practitioner

## 2012-03-10 NOTE — Telephone Encounter (Signed)
Patient given results as per Willette Cluster, NP on results.

## 2012-03-11 ENCOUNTER — Encounter: Payer: Self-pay | Admitting: Internal Medicine

## 2012-03-11 ENCOUNTER — Ambulatory Visit (INDEPENDENT_AMBULATORY_CARE_PROVIDER_SITE_OTHER): Payer: Medicare Other | Admitting: Internal Medicine

## 2012-03-11 VITALS — BP 110/78 | HR 71 | Temp 97.0°F | Ht 63.0 in | Wt 166.0 lb

## 2012-03-11 DIAGNOSIS — N390 Urinary tract infection, site not specified: Secondary | ICD-10-CM

## 2012-03-11 DIAGNOSIS — I4891 Unspecified atrial fibrillation: Secondary | ICD-10-CM

## 2012-03-11 DIAGNOSIS — K589 Irritable bowel syndrome without diarrhea: Secondary | ICD-10-CM

## 2012-03-11 LAB — POCT URINALYSIS DIPSTICK
Bilirubin, UA: NEGATIVE
Blood, UA: POSITIVE
Glucose, UA: NEGATIVE
Nitrite, UA: NEGATIVE
Spec Grav, UA: >=1.03
Urobilinogen, UA: 0.2
pH, UA: 6

## 2012-03-11 MED ORDER — SULFAMETHOXAZOLE-TRIMETHOPRIM 800-160 MG PO TABS: 1.0000 | ORAL_TABLET | Freq: Two times a day (BID) | ORAL | Status: DC

## 2012-03-11 NOTE — Assessment & Plan Note (Signed)
Diarrhea prone, long history of same known Again reviewed symptoms and diagnosis with patient today, suspect mild exacerbation by UTI

## 2012-03-11 NOTE — Patient Instructions (Signed)
It was good to see you today. We have reviewed your interval history including labs and tests today Septra antibiotics for bladder infection symptoms - Your prescription(s) have been submitted to your pharmacy. Please take as directed and contact our office if you believe you are having problem(s) with the medication(s). Other Medications reviewed and updated Urinary Tract Infection Urinary tract infections (UTIs) can develop anywhere along your urinary tract. Your urinary tract is your body's drainage system for removing wastes and extra water. Your urinary tract includes two kidneys, two ureters, a bladder, and a urethra. Your kidneys are a pair of bean-shaped organs. Each kidney is about the size of your fist. They are located below your ribs, one on each side of your spine. CAUSES Infections are caused by microbes, which are microscopic organisms, including fungi, viruses, and bacteria. These organisms are so small that they can only be seen through a microscope. Bacteria are the microbes that most commonly cause UTIs. SYMPTOMS   Symptoms of UTIs may vary by age and gender of the patient and by the location of the infection. Symptoms in young women typically include a frequent and intense urge to urinate and a painful, burning feeling in the bladder or urethra during urination. Older women and men are more likely to be tired, shaky, and weak and have muscle aches and abdominal pain. A fever may mean the infection is in your kidneys. Other symptoms of a kidney infection include pain in your back or sides below the ribs, nausea, and vomiting. DIAGNOSIS To diagnose a UTI, your caregiver will ask you about your symptoms. Your caregiver also will ask to provide a urine sample. The urine sample will be tested for bacteria and white blood cells. White blood cells are made by your body to help fight infection. TREATMENT   Typically, UTIs can be treated with medication. Because most UTIs are caused by a  bacterial infection, they usually can be treated with the use of antibiotics. The choice of antibiotic and length of treatment depend on your symptoms and the type of bacteria causing your infection. HOME CARE INSTRUCTIONS  If you were prescribed antibiotics, take them exactly as your caregiver instructs you. Finish the medication even if you feel better after you have only taken some of the medication.   Drink enough water and fluids to keep your urine clear or pale yellow.   Avoid caffeine, tea, and carbonated beverages. They tend to irritate your bladder.   Empty your bladder often. Avoid holding urine for long periods of time.   Empty your bladder before and after sexual intercourse.   After a bowel movement, women should cleanse from front to back. Use each tissue only once.  SEEK MEDICAL CARE IF:    You have back pain.   You develop a fever.   Your symptoms do not begin to resolve within 3 days.  SEEK IMMEDIATE MEDICAL CARE IF:    You have severe back pain or lower abdominal pain.   You develop chills.   You have nausea or vomiting.   You have continued burning or discomfort with urination.  MAKE SURE YOU:    Understand these instructions.   Will watch your condition.   Will get help right away if you are not doing well or get worse.  Document Released: 02/25/2005 Document Revised: 11/17/2011 Document Reviewed: 06/26/2011 [facility] Patient Information 2013 [place], [place].

## 2012-03-11 NOTE — Assessment & Plan Note (Signed)
On Multaq > maintaining normal sinus occassionally symptomatic palpitations at times (but overall improved) On aspirin therapy, as pt high-risk for Coumadin because of seizure disorder Follows with cardiology for same, no current symptoms

## 2012-03-11 NOTE — Progress Notes (Signed)
Subjective:    Patient ID: Misty Meyer, female    DOB: 1941-01-09, 71 y.o.   MRN: [id number]  Urinary Tract Infection  This is a new problem. The current episode started in the past 7 days. The problem occurs every urination. The problem has been gradually worsening. The quality of the pain is described as aching and burning. The pain is moderate. There has been no fever. There is no history of pyelonephritis. Associated symptoms include flank pain, frequency, hematuria and nausea. Pertinent negatives include no chills, discharge, possible pregnancy or vomiting. She has tried increased fluids for the symptoms. The treatment provided mild relief. Her past medical history is significant for recurrent UTIs. There is no history of kidney stones or a urological procedure.   Also reviewed chronic medical issues:   history of nonobstructive CAD, cardiac catheterization 8/10: Vigorous LV function, ostial LAD 50%  paroxysmal atrial fibrillation prompting admissions to the hospital 2/11, 10/11 and 1/12.  She was placed on Multaq after presentation to the hospital 1/12.  She has been maintained on aspirin only.  She has remained in sinus rhythm since that time.    seizure disorder - follows with neuro for same - grand mal seizure summer 2012, now on Keppra in addition to Phenobarbital.    Hyperlipidemia - on statin - follows with cardiology   GERD, chronic diarrhea - on carafate    Past Medical History  Diagnosis Date  . CAD (coronary artery disease)     nonobst cath 12/2008  . HLD (hyperlipidemia)   . Calculus of gallbladder without mention of cholecystitis or obstruction 03/2011    s/p lap chole  . IBS (irritable bowel syndrome)   . GERD (gastroesophageal reflux disease)   . Tinnitus   . Seizure disorder     Grand mal seizure 7/12, [facility]  . Anemia   . Allergic rhinitis, cause unspecified   . Major depressive disorder, single episode, severe, without mention of psychotic behavior    . Arthritis of back   . Atrial fibrillation     on Multaq  . Asthma   . Osteoporosis     DEXA 04/2011: -3.1 L fem, on Boniva  . Diverticulosis     Review of Systems  Constitutional: Negative for chills.  Gastrointestinal: Positive for nausea. Negative for vomiting.  Genitourinary: Positive for frequency, hematuria and flank pain.   Constitutional: Negative for fever or weight change.  Respiratory: Negative for cough and shortness of breath.   Cardiovascular: Negative for chest pain or palpitations.      Objective:   Physical Exam  BP 110/78  Pulse 71  Temp 97 F (36.1 C) (Oral)  Ht 5\' 3"  (1.6 m)  Wt 166 lb (75.297 kg)  BMI 29.41 kg/m2  SpO2 96% Wt Readings from Last 3 Encounters:  03/11/12 166 lb (75.297 kg)  03/07/12 164 lb (74.39 kg)  02/10/12 167 lb 6.4 oz (75.932 kg)   Constitutional: She appears well-developed and well-nourished. No distress.  Neck: Normal range of motion. Neck supple. No JVD present. No thyromegaly present.  Cardiovascular: Normal rate, regular rhythm and normal heart sounds.  No murmur heard. No BLE edema. Pulmonary/Chest: Effort normal and breath sounds normal. No respiratory distress. She has no wheezes.  Abd: mild suprapubic tenderness to palpation, no r/g +BS Psychiatric: She has a normal mood and affect. Her behavior is normal. Judgment and thought content normal.   Lab Results  Component Value Date   WBC 8.4 12/28/2011   HGB  12.9 12/28/2011   HCT 38.4 12/28/2011   PLT 182.0 12/28/2011   GLUCOSE 87 02/09/2012   CHOL 138 06/08/2011   TRIG 69.0 06/08/2011   HDL 55.60 06/08/2011   LDLDIRECT 137.0 10/23/2008   LDLCALC 69 06/08/2011   ALT 29 12/28/2011   AST 23 12/28/2011   NA 143 02/09/2012   K 3.8 02/09/2012   CL 108 02/09/2012   CREATININE 1.1 02/09/2012   BUN 21 02/09/2012   CO2 27 02/09/2012   TSH 0.37 06/23/2011   INR 1.0 01/03/2009   HGBA1C 5.7 02/09/2012        Assessment & Plan:   See problem list. Medications and labs reviewed  today.  UTI - +Udip LE - antibiotics and supportive care - education provided

## 2012-03-14 ENCOUNTER — Telehealth: Payer: Self-pay | Admitting: Internal Medicine

## 2012-03-14 NOTE — Telephone Encounter (Signed)
Samples of Align placed upfront in cabinet for pt to pickup, pt informed.

## 2012-03-14 NOTE — Telephone Encounter (Signed)
The patient called the triage line and is hoping to get samples of Align.  Her callback is [phone].

## 2012-03-15 ENCOUNTER — Telehealth: Payer: Self-pay

## 2012-03-15 ENCOUNTER — Other Ambulatory Visit: Payer: Self-pay | Admitting: Internal Medicine

## 2012-03-15 NOTE — Telephone Encounter (Signed)
Patient informed. 

## 2012-03-15 NOTE — Telephone Encounter (Signed)
Pt walked into clinic requesting advisement on whether she should refill ABX prescribed on 10/11 for UTI. Pt says her urine is clear, no blood, no odor but her lower abdomen "still does not feel right". Pt is requesting MD advisement only and a recommendation on possible urine specimen

## 2012-03-15 NOTE — Telephone Encounter (Signed)
Yes, as the Udip was c/w probable UTI

## 2012-03-29 ENCOUNTER — Other Ambulatory Visit: Payer: Self-pay | Admitting: *Deleted

## 2012-03-29 ENCOUNTER — Other Ambulatory Visit: Payer: Self-pay | Admitting: Cardiology

## 2012-03-29 NOTE — Telephone Encounter (Signed)
Received fax pt requesting refill on Keppra. Last filled 01/02/11. Is this ok to refill?...Raechel Chute

## 2012-03-30 MED ORDER — LEVETIRACETAM 500 MG PO TABS: 500.0000 mg | ORAL_TABLET | Freq: Two times a day (BID) | ORAL | Status: DC

## 2012-03-30 NOTE — Telephone Encounter (Signed)
Called [facility] spoke with abby gave approval. MD already updated epic...lmb

## 2012-04-04 ENCOUNTER — Other Ambulatory Visit: Payer: Self-pay | Admitting: Cardiology

## 2012-04-12 ENCOUNTER — Other Ambulatory Visit: Payer: Medicare Other

## 2012-04-12 ENCOUNTER — Ambulatory Visit (INDEPENDENT_AMBULATORY_CARE_PROVIDER_SITE_OTHER): Payer: Medicare Other | Admitting: Internal Medicine

## 2012-04-12 ENCOUNTER — Encounter: Payer: Self-pay | Admitting: Internal Medicine

## 2012-04-12 VITALS — BP 118/62 | HR 75 | Temp 96.9°F | Ht 63.0 in | Wt 163.0 lb

## 2012-04-12 DIAGNOSIS — IMO0001 Reserved for inherently not codable concepts without codable children: Secondary | ICD-10-CM

## 2012-04-12 DIAGNOSIS — N390 Urinary tract infection, site not specified: Secondary | ICD-10-CM

## 2012-04-12 DIAGNOSIS — R350 Frequency of micturition: Secondary | ICD-10-CM

## 2012-04-12 DIAGNOSIS — R3915 Urgency of urination: Secondary | ICD-10-CM

## 2012-04-12 DIAGNOSIS — R300 Dysuria: Secondary | ICD-10-CM

## 2012-04-12 LAB — POCT URINALYSIS DIPSTICK
Blood, UA: POSITIVE
Glucose, UA: NEGATIVE
Ketones, UA: NEGATIVE
Spec Grav, UA: 1.02
Urobilinogen, UA: 0.2

## 2012-04-12 MED ORDER — CEPHALEXIN 750 MG PO CAPS: 750.0000 mg | ORAL_CAPSULE | Freq: Three times a day (TID) | ORAL | Status: DC

## 2012-04-12 NOTE — Patient Instructions (Signed)
Urinary Tract Infection Urinary tract infections (UTIs) can develop anywhere along your urinary tract. Your urinary tract is your body's drainage system for removing wastes and extra water. Your urinary tract includes two kidneys, two ureters, a bladder, and a urethra. Your kidneys are a pair of bean-shaped organs. Each kidney is about the size of your fist. They are located below your ribs, one on each side of your spine. CAUSES Infections are caused by microbes, which are microscopic organisms, including fungi, viruses, and bacteria. These organisms are so small that they can only be seen through a microscope. Bacteria are the microbes that most commonly cause UTIs. SYMPTOMS  Symptoms of UTIs may vary by age and gender of the patient and by the location of the infection. Symptoms in young women typically include a frequent and intense urge to urinate and a painful, burning feeling in the bladder or urethra during urination. Older women and men are more likely to be tired, shaky, and weak and have muscle aches and abdominal pain. A fever may mean the infection is in your kidneys. Other symptoms of a kidney infection include pain in your back or sides below the ribs, nausea, and vomiting. DIAGNOSIS To diagnose a UTI, your caregiver will ask you about your symptoms. Your caregiver also will ask to provide a urine sample. The urine sample will be tested for bacteria and white blood cells. White blood cells are made by your body to help fight infection. TREATMENT  Typically, UTIs can be treated with medication. Because most UTIs are caused by a bacterial infection, they usually can be treated with the use of antibiotics. The choice of antibiotic and length of treatment depend on your symptoms and the type of bacteria causing your infection. HOME CARE INSTRUCTIONS  If you were prescribed antibiotics, take them exactly as your caregiver instructs you. Finish the medication even if you feel better after you  have only taken some of the medication.  Drink enough water and fluids to keep your urine clear or pale yellow.  Avoid caffeine, tea, and carbonated beverages. They tend to irritate your bladder.  Empty your bladder often. Avoid holding urine for long periods of time.  Empty your bladder before and after sexual intercourse.  After a bowel movement, women should cleanse from front to back. Use each tissue only once. SEEK MEDICAL CARE IF:   You have back pain.  You develop a fever.  Your symptoms do not begin to resolve within 3 days. SEEK IMMEDIATE MEDICAL CARE IF:   You have severe back pain or lower abdominal pain.  You develop chills.  You have nausea or vomiting.  You have continued burning or discomfort with urination. MAKE SURE YOU:   Understand these instructions.  Will watch your condition.  Will get help right away if you are not doing well or get worse. Document Released: 02/25/2005 Document Revised: 11/17/2011 Document Reviewed: 06/26/2011 Exit[employer] Patient Information 2013 [employer].  

## 2012-04-12 NOTE — Addendum Note (Signed)
Addended by: Carin Primrose on: 04/12/2012 02:23 PM   Modules accepted: Orders

## 2012-04-12 NOTE — Progress Notes (Signed)
Subjective:    Patient ID: Misty Meyer, female    DOB: 1941/05/13, 71 y.o.   MRN: [id number]  HPI  Pt presents to the clinic today with c/o UTI symptoms. She recently saw Dr.Leschber in 03/2012 for the same complaint, was prescribed Bactrim which seemed to work well. Her current symptoms started 3 days ago with urgency, frequency, and pain with urination.  She denies nausea, vomiting, chills or back pain. She has increased her fluids and taking in cranberry juice. She has taken Advil for the pain which seems to help.  Review of Systems  Past Medical History  Diagnosis Date  . CAD (coronary artery disease)     nonobst cath 12/2008  . HLD (hyperlipidemia)   . Calculus of gallbladder without mention of cholecystitis or obstruction 03/2011    s/p lap chole  . IBS (irritable bowel syndrome)   . GERD (gastroesophageal reflux disease)   . Tinnitus   . Seizure disorder     Grand mal seizure 7/12, [facility]  . Anemia   . Allergic rhinitis, cause unspecified   . Major depressive disorder, single episode, severe, without mention of psychotic behavior   . Arthritis of back   . Atrial fibrillation     on Multaq  . Asthma   . Osteoporosis     DEXA 04/2011: -3.1 L fem, on Boniva  . Diverticulosis     Current Outpatient Prescriptions  Medication Sig Dispense Refill  . acetaminophen (TYLENOL) 500 MG tablet Take 500 mg by mouth every 6 (six) hours as needed.        Marland Kitchen albuterol (PROAIR HFA) 108 (90 BASE) MCG/ACT inhaler Inhale 2 puffs into the lungs every 4 (four) hours as needed for wheezing.  1 Inhaler  1  . aspirin 325 MG tablet Take 325 mg by mouth daily.        . beclomethasone (QVAR) 40 MCG/ACT inhaler Inhale 2 puffs into the lungs 2 (two) times daily.  8.7 g  11  . CRESTOR 20 MG tablet TAKE 1 TABLET BY MOUTH EVERY DAY  30 tablet  0  . dronedarone (MULTAQ) 400 MG tablet Take 1 tablet (400 mg total) by mouth 2 (two) times daily with a meal.  60 tablet  11  . fluticasone (FLONASE)  50 MCG/ACT nasal spray PLACE 2 SPRAYS INTO THE NOSE DAILY AS DIRECTED  16 g  5  . ibandronate (BONIVA) 150 MG tablet Take 150 mg by mouth every 30 (thirty) days. Take in the morning with a full glass of water, on an empty stomach, and do not take anything else by mouth or lie down for the next 30 min.       . lansoprazole (PREVACID) 15 MG capsule Take 1 capsule (15 mg total) by mouth daily.  30 capsule  5  . levETIRAcetam (KEPPRA) 500 MG tablet Take 1 tablet (500 mg total) by mouth 2 (two) times daily.  60 tablet  1  . loperamide (IMODIUM A-D) 2 MG tablet Take 2 mg by mouth as needed.        . metoprolol succinate (TOPROL-XL) 50 MG 24 hr tablet TAKE 1 TABLET BY MOUTH EVERY DAY  30 tablet  9  . NITROSTAT 0.4 MG SL tablet DISSOLVE 1 TABLET UNDER THE TONGUE EVERY 5 MINUTES AS NEEDED FOR CHEST PAIN, MAY REPEAT THREE TIMES  25 tablet  3  . potassium chloride (K-DUR,KLOR-CON) 10 MEQ tablet TAKE 1 TABLET BY MOUTH DAILY  30 tablet  10  .  Probiotic Product (ALIGN PO) Take 1 tablet by mouth daily.      . sodium chloride (OCEAN) 0.65 % nasal spray 3 sprays by Nasal route 3 (three) times daily.       Marland Kitchen sulfamethoxazole-trimethoprim (SEPTRA DS) 800-160 MG per tablet Take 1 tablet by mouth 2 (two) times daily.  14 tablet  0  . tiZANidine (ZANAFLEX) 4 MG tablet Take 1 tablet (4 mg total) by mouth every 6 (six) hours as needed.  30 tablet  3  . [DISCONTINUED] potassium chloride (KLOR-CON) 10 MEQ CR tablet Take 1 tablet (10 mEq total) by mouth daily.  30 tablet  11    Allergies  Allergen Reactions  . Codeine Other (See Comments)    Caused possible hallucinations and anxiety  . Latex Rash  . Neomycin-Bacitracin Zn-Polymyx Itching and Rash  . Penicillins Swelling    Lips    Family History  Problem Relation Age of Onset  . Angina Mother   . Alzheimer's disease Mother   . Heart attack Father   . Heart failure Father   . Heart disease Father   . Heart disease Brother   . Breast cancer Maternal Aunt   .  Cancer Maternal Aunt     breast  . Cervical cancer Maternal Aunt   . Cancer Maternal Aunt     ovarian  . Colon cancer Neg Hx     History   Social History  . Marital Status: Divorced    Spouse Name: N/A    Number of Children: 0  . 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     Social History Main Topics  . Smoking status: Never Smoker   . Smokeless tobacco: Never Used  . Alcohol Use: No     Comment: quit 2012  . Drug Use: No  . Sexually Active: No   Other Topics Concern  . Not on file   Social History Narrative  . No narrative on file     Constitutional: Denies fever, malaise, fatigue, headache or abrupt weight changes.  GU:  Pt reports urgency, frequency and pain with urination. Denies urgency, burning sensation, blood in urine, odor or discharge.   No other specific complaints in a complete review of systems (except as listed in HPI above).     Objective:   Physical Exam  There were no vitals taken for this visit. Wt Readings from Last 3 Encounters:  03/11/12 166 lb (75.297 kg)  03/07/12 164 lb (74.39 kg)  02/10/12 167 lb 6.4 oz (75.932 kg)    General: Appears her stated age, well developed, well nourished in NAD. Cardiovascular: Normal rate and rhythm. S1,S2 noted.  No murmur, rubs or gallops noted. No JVD or BLE edema. No carotid bruits noted. Pulmonary/Chest: Normal effort and positive vesicular breath sounds. No respiratory distress. No wheezes, rales or ronchi noted.  Abdomen: Soft and nontender. Normal bowel sounds, no bruits noted. No distention or masses noted. Liver, spleen and kidneys non palpable. Tender to palpation over the bladder. No CVA tenderness.     Assessment & Plan:   UTI, new problem with additional workup required:  Urinalysis Urine Culture Drink plenty of fluids Take keflex 750 mg TID x 7 days  RTC as needed or if symptoms persist

## 2012-04-13 ENCOUNTER — Ambulatory Visit: Payer: Medicare Other | Admitting: Internal Medicine

## 2012-04-15 LAB — URINE CULTURE: Colony Count: >=100000

## 2012-04-18 ENCOUNTER — Telehealth: Payer: Self-pay | Admitting: Internal Medicine

## 2012-04-18 NOTE — Telephone Encounter (Signed)
OTC monistat cream most effective relief with least medication interactions  If that ineffective, or if pt has there request - please let me know

## 2012-04-18 NOTE — Telephone Encounter (Signed)
Vaginal itching after taking antibiotics for UTI.  What should she take.  Cell is  [phone]

## 2012-04-18 NOTE — Telephone Encounter (Signed)
Notified pt with md response.../lmb 

## 2012-04-25 ENCOUNTER — Encounter: Payer: Self-pay | Admitting: Internal Medicine

## 2012-04-25 ENCOUNTER — Ambulatory Visit (INDEPENDENT_AMBULATORY_CARE_PROVIDER_SITE_OTHER): Payer: Medicare Other | Admitting: Internal Medicine

## 2012-04-25 ENCOUNTER — Other Ambulatory Visit: Payer: Self-pay | Admitting: *Deleted

## 2012-04-25 VITALS — BP 102/72 | HR 88 | Temp 97.0°F | Ht 63.0 in | Wt 164.0 lb

## 2012-04-25 DIAGNOSIS — E785 Hyperlipidemia, unspecified: Secondary | ICD-10-CM

## 2012-04-25 DIAGNOSIS — R300 Dysuria: Secondary | ICD-10-CM

## 2012-04-25 DIAGNOSIS — K219 Gastro-esophageal reflux disease without esophagitis: Secondary | ICD-10-CM

## 2012-04-25 DIAGNOSIS — K589 Irritable bowel syndrome without diarrhea: Secondary | ICD-10-CM

## 2012-04-25 MED ORDER — ROSUVASTATIN CALCIUM 20 MG PO TABS: 20.0000 mg | ORAL_TABLET | Freq: Every day | ORAL | Status: DC

## 2012-04-25 MED ORDER — RANITIDINE HCL 150 MG PO TABS: 150.0000 mg | ORAL_TABLET | Freq: Two times a day (BID) | ORAL | Status: DC

## 2012-04-25 NOTE — Assessment & Plan Note (Signed)
Previously on Prevacid, changed to omeprazole early 2012, then changed to pantoprazole 06/2011 stopped Carafate 08/2011 and resumed prevacid 09/2011 periodic symptomatic indigestion and acid reflux persist - suspect related to IBS - see above Change to H2B in place of PPI and followup with GI as needed

## 2012-04-25 NOTE — Assessment & Plan Note (Signed)
On crestor - last lipids reviewed, at goal

## 2012-04-25 NOTE — Assessment & Plan Note (Signed)
Diarrhea prone, long history of same known Again reviewed symptoms and diagnosis with patient today, suspect mild exacerbation by recent UTI and antibiotics

## 2012-04-25 NOTE — Progress Notes (Signed)
  Subjective:    Patient ID: Misty Meyer, female    DOB: Dec 28, 1940, 71 y.o.   MRN: [id number]  GI Problem Primary symptoms do not include fever or fatigue.  The illness does not include constipation.  complains of chronic "diarrhea" - describes loose stools due to PPI - No liquid stool or incontinence of stool Various type of PPIs tried No BRBPR or melena, no abdominal pain or fever last colo 2011, EGD 12/2011 benign On Align - variable improved  Also reviewed chronic medical issues and reviewed numerous medicine questions   Past Medical History  Diagnosis Date  . CAD (coronary artery disease)     nonobst cath 12/2008  . HLD (hyperlipidemia)   . Calculus of gallbladder without mention of cholecystitis or obstruction 03/2011    s/p lap chole  . IBS (irritable bowel syndrome)   . GERD (gastroesophageal reflux disease)   . Tinnitus   . Seizure disorder     Grand mal seizure 7/12, [facility]  . Anemia   . Allergic rhinitis, cause unspecified   . Major depressive disorder, single episode, severe, without mention of psychotic behavior   . Arthritis of back   . Atrial fibrillation     on Multaq  . Asthma   . Osteoporosis     DEXA 04/2011: -3.1 L fem, on Boniva  . Diverticulosis     Review of Systems  Constitutional: Negative for fever, fatigue and unexpected weight change.  Respiratory: Negative for cough and shortness of breath.   Gastrointestinal: Negative for constipation and blood in stool.       Objective:   Physical Exam  BP 102/72  Pulse 88  Temp 97 F (36.1 C) (Oral)  Ht 5\' 3"  (1.6 m)  Wt 164 lb (74.39 kg)  BMI 29.05 kg/m2  SpO2 97% Wt Readings from Last 3 Encounters:  04/25/12 164 lb (74.39 kg)  04/12/12 163 lb (73.936 kg)  03/11/12 166 lb (75.297 kg)   Constitutional: She appears well-developed and well-nourished. No distress.  Neck: Normal range of motion. Neck supple. No JVD present. No thyromegaly present.  Cardiovascular: normal rate,  irregular rhythm, normal sounds.  No murmur heard. No BLE edema. Pulmonary/Chest: Effort normal and breath sounds normal. No respiratory distress. She has no wheezes.  Abdominal: Soft. Bowel sounds are normal. She exhibits no distension. There is no tenderness. no masses  Lab Results  Component Value Date   WBC 8.4 12/28/2011   HGB 12.9 12/28/2011   HCT 38.4 12/28/2011   PLT 182.0 12/28/2011   GLUCOSE 87 02/09/2012   CHOL 138 06/08/2011   TRIG 69.0 06/08/2011   HDL 55.60 06/08/2011   LDLDIRECT 137.0 10/23/2008   LDLCALC 69 06/08/2011   ALT 29 12/28/2011   AST 23 12/28/2011   NA 143 02/09/2012   K 3.8 02/09/2012   CL 108 02/09/2012   CREATININE 1.1 02/09/2012   BUN 21 02/09/2012   CO2 27 02/09/2012   TSH 0.37 06/23/2011   INR 1.0 01/03/2009   HGBA1C 5.7 02/09/2012       Assessment & Plan:  See problem list. Medications and labs reviewed today.  Time spent with pt today 25 minutes, greater than 50% time spent counseling patient on chronic medical issues and medication review. Also review of interval records

## 2012-04-25 NOTE — Patient Instructions (Signed)
It was good to see you today. Stop Keflex antibiotics after today - you are done! Stop prevacid and start prescription dose Zantac (generic) for acid reflux symptoms -  Your prescription(s) have been submitted to your pharmacy. Please take as directed and contact our office if you believe you are having problem(s) with the medication(s). Other Medications reviewed, no changes at this time.

## 2012-04-29 ENCOUNTER — Emergency Department (HOSPITAL_COMMUNITY): Payer: Medicare Other

## 2012-04-29 ENCOUNTER — Encounter (HOSPITAL_COMMUNITY): Payer: Self-pay | Admitting: Emergency Medicine

## 2012-04-29 ENCOUNTER — Emergency Department (HOSPITAL_COMMUNITY)
Admission: EM | Admit: 2012-04-29 | Discharge: 2012-04-29 | Disposition: A | Discharge status: Home / Self Care | Payer: Medicare Other | Attending: Emergency Medicine | Admitting: Emergency Medicine

## 2012-04-29 ENCOUNTER — Telehealth: Payer: Self-pay | Admitting: Cardiology

## 2012-04-29 DIAGNOSIS — F322 Major depressive disorder, single episode, severe without psychotic features: Secondary | ICD-10-CM | POA: Insufficient documentation

## 2012-04-29 DIAGNOSIS — Z8739 Personal history of other diseases of the musculoskeletal system and connective tissue: Secondary | ICD-10-CM | POA: Insufficient documentation

## 2012-04-29 DIAGNOSIS — J3489 Other specified disorders of nose and nasal sinuses: Secondary | ICD-10-CM | POA: Insufficient documentation

## 2012-04-29 DIAGNOSIS — M2550 Pain in unspecified joint: Secondary | ICD-10-CM | POA: Insufficient documentation

## 2012-04-29 DIAGNOSIS — R509 Fever, unspecified: Secondary | ICD-10-CM | POA: Insufficient documentation

## 2012-04-29 DIAGNOSIS — J189 Pneumonia, unspecified organism: Secondary | ICD-10-CM | POA: Insufficient documentation

## 2012-04-29 DIAGNOSIS — Z8719 Personal history of other diseases of the digestive system: Secondary | ICD-10-CM | POA: Insufficient documentation

## 2012-04-29 DIAGNOSIS — Z7982 Long term (current) use of aspirin: Secondary | ICD-10-CM | POA: Insufficient documentation

## 2012-04-29 DIAGNOSIS — R110 Nausea: Secondary | ICD-10-CM | POA: Insufficient documentation

## 2012-04-29 DIAGNOSIS — I4891 Unspecified atrial fibrillation: Secondary | ICD-10-CM | POA: Insufficient documentation

## 2012-04-29 DIAGNOSIS — Z9861 Coronary angioplasty status: Secondary | ICD-10-CM | POA: Insufficient documentation

## 2012-04-29 DIAGNOSIS — J069 Acute upper respiratory infection, unspecified: Secondary | ICD-10-CM | POA: Insufficient documentation

## 2012-04-29 DIAGNOSIS — J45909 Unspecified asthma, uncomplicated: Secondary | ICD-10-CM | POA: Insufficient documentation

## 2012-04-29 DIAGNOSIS — R490 Dysphonia: Secondary | ICD-10-CM | POA: Insufficient documentation

## 2012-04-29 DIAGNOSIS — E785 Hyperlipidemia, unspecified: Secondary | ICD-10-CM | POA: Insufficient documentation

## 2012-04-29 DIAGNOSIS — Z862 Personal history of diseases of the blood and blood-forming organs and certain disorders involving the immune mechanism: Secondary | ICD-10-CM | POA: Insufficient documentation

## 2012-04-29 DIAGNOSIS — Z79899 Other long term (current) drug therapy: Secondary | ICD-10-CM | POA: Insufficient documentation

## 2012-04-29 DIAGNOSIS — M810 Age-related osteoporosis without current pathological fracture: Secondary | ICD-10-CM | POA: Insufficient documentation

## 2012-04-29 DIAGNOSIS — G40909 Epilepsy, unspecified, not intractable, without status epilepticus: Secondary | ICD-10-CM | POA: Insufficient documentation

## 2012-04-29 DIAGNOSIS — I2510 Atherosclerotic heart disease of native coronary artery without angina pectoris: Secondary | ICD-10-CM | POA: Insufficient documentation

## 2012-04-29 LAB — CBC
Hemoglobin: 11 g/dL — ABNORMAL LOW (ref 12.0–15.0)
MCH: 30.6 pg (ref 26.0–34.0)
MCHC: 32.3 g/dL (ref 30.0–36.0)
Platelets: 187 10*3/uL (ref 150–400)

## 2012-04-29 LAB — POCT I-STAT, CHEM 8
Chloride: 104 mEq/L (ref 96–112)
Creatinine, Ser: 1.1 mg/dL (ref 0.50–1.10)
Glucose, Bld: 102 mg/dL — ABNORMAL HIGH (ref 70–99)
HCT: 34 % — ABNORMAL LOW (ref 36.0–46.0)
Potassium: 4 mEq/L (ref 3.5–5.1)

## 2012-04-29 MED ORDER — ACETAMINOPHEN 325 MG PO TABS
650.0000 mg | ORAL_TABLET | Freq: Once | ORAL | Status: AC
  Administered 2012-04-29: 650 mg via ORAL
  Filled 2012-04-29: qty 2

## 2012-04-29 MED ORDER — DIPHENHYDRAMINE HCL 25 MG PO CAPS
25.0000 mg | ORAL_CAPSULE | Freq: Once | ORAL | Status: AC
  Administered 2012-04-29: 25 mg via ORAL
  Filled 2012-04-29: qty 1

## 2012-04-29 MED ORDER — BENZONATATE 100 MG PO CAPS: 100.0000 mg | ORAL_CAPSULE | Freq: Three times a day (TID) | ORAL | Status: DC

## 2012-04-29 MED ORDER — SODIUM CHLORIDE 0.9 % IV BOLUS (SEPSIS)
500.0000 mL | Freq: Once | INTRAVENOUS | Status: AC
  Administered 2012-04-29: 500 mL via INTRAVENOUS

## 2012-04-29 MED ORDER — LEVOFLOXACIN 500 MG PO TABS: 500.0000 mg | ORAL_TABLET | Freq: Every day | ORAL | Status: DC

## 2012-04-29 NOTE — Telephone Encounter (Signed)
Spoke with pt, she was seen for URI and poss pneumonia. She was given benzonate 100 mg  And levosloxacin 500 mg

## 2012-04-29 NOTE — Telephone Encounter (Signed)
She was concerned about these meds causing her to have atrial fib again. Explained to pt not listed in the adverse effects on these meds. Pt voiced understanding.

## 2012-04-29 NOTE — Telephone Encounter (Signed)
New message:  Pt was just released from hospital and was started on some new medications.  She has a question regarding side effects of the medication.

## 2012-04-29 NOTE — ED Provider Notes (Signed)
Medical screening examination/treatment/procedure(s) were performed by non-physician practitioner and as supervising physician I was immediately available for consultation/collaboration.  Olivia Mackie, MD 04/29/12 2131

## 2012-04-29 NOTE — ED Notes (Addendum)
PER EMS- Patient states she went to have her hair fixed several days ago and they did not have the heat on. Patient now c/o cold like symptoms. Patient states she is having chills, sore throat, warm to touch. Symptoms originated in the last 24 hours. Patient only taking throat lounzes. Alertx4, NAD.

## 2012-04-29 NOTE — ED Provider Notes (Signed)
History     CSN: [id number]  Arrival date & time 04/29/12  0553   None     Chief Complaint  Patient presents with  . URI    (Consider location/radiation/quality/duration/timing/severity/associated sxs/prior treatment) Patient is a 71 y.o. female presenting with URI. The history is provided by the patient. No language interpreter was used.  URI The primary symptoms include fever, cough, nausea and arthralgias. Primary symptoms do not include ear pain, sore throat, vomiting or rash. The current episode started 3 to 5 days ago. This is a new problem. The problem has been gradually worsening.  Symptoms associated with the illness include chills, sinus pressure, congestion and rhinorrhea. The illness is not associated with facial pain. Associated symptoms comments: Hoarse voice.   71 year old female coming in today with multiple complaints including upper respiratory complaints with low grade fever.  States that she went to get her hair done on Tuesday and it was called in to shop and she thinks that's when she started her upper respiratory symptoms. States that when she lays down that makes her cough. The patient has been treated for a UTI recently on Bactrim and some other antibiotic she cant remember.  She stopped the Bactrim on Tuesday. She saw Dr. Ian Bushman in the office on the 11/25. Patient states that she is eating and drinking normally. States that she does have some nausea but no vomiting. She has chronic diarrhea but none today. Dr. [name] is her heart Dr. Dr. Delford Field is her lung doctor.  Patient lives alone and came into the ER by ambulance. Non toxic appearance.   Past Medical History  Diagnosis Date  . CAD (coronary artery disease)     nonobst cath 12/2008  . HLD (hyperlipidemia)   . Calculus of gallbladder without mention of cholecystitis or obstruction 03/2011    s/p lap chole  . IBS (irritable bowel syndrome)   . GERD (gastroesophageal reflux disease)   . Tinnitus   .  Seizure disorder     Grand mal seizure 7/12, [facility]  . Anemia   . Allergic rhinitis, cause unspecified   . Major depressive disorder, single episode, severe, without mention of psychotic behavior   . Arthritis of back   . Atrial fibrillation     on Multaq  . Asthma   . Osteoporosis     DEXA 04/2011: -3.1 L fem, on Boniva  . Diverticulosis     Past Surgical History  Procedure Date  . Total abdominal hysterectomy   . Wrist fracture surgery     with plates (left)  . Tonsillectomy     age 63  . Cholecystectomy 03/25/2011    Family History  Problem Relation Age of Onset  . Angina Mother   . Alzheimer's disease Mother   . Heart attack Father   . Heart failure Father   . Heart disease Father   . Heart disease Brother   . Breast cancer Maternal Aunt   . Cancer Maternal Aunt     breast  . Cervical cancer Maternal Aunt   . Cancer Maternal Aunt     ovarian  . Colon cancer Neg Hx     History  Substance Use Topics  . Smoking status: Never Smoker   . Smokeless tobacco: Never Used  . Alcohol Use: No     Comment: quit 2012    OB History    Grav Para Term Preterm Abortions TAB SAB Ect Mult Living  Review of Systems  Constitutional: Positive for fever and chills.  HENT: Positive for congestion, rhinorrhea and sinus pressure. Negative for ear pain and sore throat.   Respiratory: Positive for cough. Negative for shortness of breath.   Gastrointestinal: Positive for nausea. Negative for vomiting.  Musculoskeletal: Positive for arthralgias.  Skin: Negative for rash.    Allergies  Codeine; Latex; Neomycin-bacitracin zn-polymyx; and Penicillins  Home Medications   Current Outpatient Rx  Name  Route  Sig  Dispense  Refill  . ACETAMINOPHEN 500 MG PO TABS   Oral   Take 500 mg by mouth every 6 (six) hours as needed.          . ASPIRIN 325 MG PO TABS   Oral   Take 325 mg by mouth daily.           . BECLOMETHASONE DIPROPIONATE 40 MCG/ACT IN  AERS   Inhalation   Inhale 2 puffs into the lungs 2 (two) times daily.   8.7 g   11   . CALCIUM CARBONATE 1250 MG PO TABS   Oral   Take 1 tablet by mouth 3 (three) times daily.         Marland Kitchen DRONEDARONE HCL 400 MG PO TABS   Oral   Take 1 tablet (400 mg total) by mouth 2 (two) times daily with a meal.   60 tablet   11   . FLUTICASONE PROPIONATE 50 MCG/ACT NA SUSP      PLACE 2 SPRAYS INTO THE NOSE DAILY AS DIRECTED   16 g   5   . IBANDRONATE SODIUM 150 MG PO TABS   Oral   Take 150 mg by mouth every 30 (thirty) days. Take in the morning with a full glass of water, on an empty stomach, and do not take anything else by mouth or lie down for the next 30 min.          Marland Kitchen LEVETIRACETAM 500 MG PO TABS   Oral   Take 1 tablet (500 mg total) by mouth 2 (two) times daily.   60 tablet   1   . LOPERAMIDE HCL 2 MG PO TABS   Oral   Take 2 mg by mouth 3 (three) times daily as needed. For diarrhea         . METOPROLOL SUCCINATE ER 50 MG PO TB24      TAKE 1 TABLET BY MOUTH EVERY DAY   30 tablet   9   . ADULT MULTIVITAMIN W/MINERALS CH   Oral   Take 1 tablet by mouth daily.         Marland Kitchen PHENOBARBITAL 64.8 MG PO TABS   Oral   Take 64.8 mg by mouth 2 (two) times daily.          Marland Kitchen POTASSIUM CHLORIDE CRYS ER 10 MEQ PO TBCR      TAKE 1 TABLET BY MOUTH DAILY   30 tablet   10   . ALIGN PO   Oral   Take 1 tablet by mouth daily.         Marland Kitchen RANITIDINE HCL 150 MG PO TABS   Oral   Take 1 tablet (150 mg total) by mouth 2 (two) times daily.   60 tablet   3   . ROSUVASTATIN CALCIUM 20 MG PO TABS   Oral   Take 1 tablet (20 mg total) by mouth daily.   30 tablet   3   . SALINE NASAL SPRAY 0.65 % NA SOLN  Nasal   3 sprays by Nasal route 3 (three) times daily.          Marland Kitchen VITAMIN C 500 MG PO TABS   Oral   Take 500 mg by mouth daily.         . ALBUTEROL SULFATE HFA 108 (90 BASE) MCG/ACT IN AERS   Inhalation   Inhale 2 puffs into the lungs every 4 (four) hours as needed  for wheezing.   1 Inhaler   1     Patient needs to schedule appt   . NITROSTAT 0.4 MG SL SUBL      DISSOLVE 1 TABLET UNDER THE TONGUE EVERY 5 MINUTES AS NEEDED FOR CHEST PAIN, MAY REPEAT THREE TIMES   25 tablet   3     BP 147/58  Pulse 103  Temp 99.1 F (37.3 C)  Resp 20  SpO2 98%  Physical Exam  Nursing note and vitals reviewed. Constitutional: She is oriented to person, place, and time. She appears well-developed and well-nourished.  HENT:  Head: Normocephalic and atraumatic.  Right Ear: Tympanic membrane normal.  Left Ear: Tympanic membrane normal.  Nose: Mucosal edema and rhinorrhea present. Right sinus exhibits maxillary sinus tenderness. Left sinus exhibits maxillary sinus tenderness.  Eyes: Conjunctivae normal and EOM are normal. Pupils are equal, round, and reactive to light.  Neck: Normal range of motion. Neck supple.  Cardiovascular: Normal rate.   Pulmonary/Chest: Effort normal and breath sounds normal. No respiratory distress. She has no wheezes. She has no rales.  Abdominal: Soft. Bowel sounds are normal. She exhibits no distension.  Musculoskeletal: Normal range of motion. She exhibits no edema and no tenderness.  Neurological: She is alert and oriented to person, place, and time. She has normal reflexes.  Skin: Skin is warm and dry.  Psychiatric: She has a normal mood and affect.    ED Course  Procedures (including critical care time)  Labs Reviewed - No data to display No results found.   No diagnosis found.    MDM   Date: 04/29/2012  Rate: 96  Rhythm: normal sinus rhythm  QRS Axis: normal  Intervals: normal  ST/T Wave abnormalities: normal  Conduction Disutrbances:none  Narrative Interpretation: normal QT prior to levoquin  Old EKG Reviewed: unchanged   Labs Reviewed  CBC - Abnormal; Notable for the following:    RBC 3.60 (*)     Hemoglobin 11.0 (*)     HCT 34.1 (*)     All other components within normal limits  POCT I-STAT, CHEM 8  - Abnormal; Notable for the following:    Glucose, Bld 102 (*)     Calcium, Ion 1.11 (*)     Hemoglobin 11.6 (*)     HCT 34.0 (*)     All other components within normal limits   Pneumonia per chst x-ray reviewed by myself with wbc 8.5 and vss.  rx for levaquin.  (QT interval normal) rx Tessalon pearls  And [employer] spirometer. Follow up on monday with Dr. Ian Bushman.  Understands to return for worsening symptoms.        Remi Haggard, NP 04/29/12 1839  Remi Haggard, NP 04/29/12 1840

## 2012-05-10 ENCOUNTER — Encounter: Payer: Self-pay | Admitting: Internal Medicine

## 2012-05-10 ENCOUNTER — Ambulatory Visit (INDEPENDENT_AMBULATORY_CARE_PROVIDER_SITE_OTHER): Payer: Medicare Other | Admitting: Internal Medicine

## 2012-05-10 VITALS — BP 110/68 | HR 96 | Temp 97.0°F | Ht 63.0 in | Wt 159.0 lb

## 2012-05-10 DIAGNOSIS — I4891 Unspecified atrial fibrillation: Secondary | ICD-10-CM

## 2012-05-10 DIAGNOSIS — J189 Pneumonia, unspecified organism: Secondary | ICD-10-CM

## 2012-05-10 DIAGNOSIS — J45909 Unspecified asthma, uncomplicated: Secondary | ICD-10-CM

## 2012-05-10 MED ORDER — CEFDINIR 300 MG PO CAPS: 300.0000 mg | ORAL_CAPSULE | Freq: Two times a day (BID) | ORAL | Status: DC

## 2012-05-10 MED ORDER — DOXYCYCLINE HYCLATE 100 MG PO TABS: 100.0000 mg | ORAL_TABLET | Freq: Two times a day (BID) | ORAL | Status: DC

## 2012-05-10 MED ORDER — BENZONATATE 100 MG PO CAPS: 100.0000 mg | ORAL_CAPSULE | Freq: Three times a day (TID) | ORAL | Status: DC

## 2012-05-10 NOTE — Progress Notes (Signed)
Subjective:    Patient ID: Misty Meyer, female    DOB: [birth year], 71 y.o.   MRN: [id number]  HPI  Here for ER follow up -seen 04/29/12 for URI symptoms  Dx with PNA based on CXR changes - but felt stable for DC home and close OP follow up Did not start FQ as rx'd due to concern for ? Arrythmia (given AF hx) Feeling improved but still with chest congestion  Also reviewed chronic medical issues and reviewed numerous medicine questions   Past Medical History  Diagnosis Date  . CAD (coronary artery disease)     nonobst cath 12/2008  . HLD (hyperlipidemia)   . Calculus of gallbladder without mention of cholecystitis or obstruction 03/2011    s/p lap chole  . IBS (irritable bowel syndrome)   . GERD (gastroesophageal reflux disease)   . Tinnitus   . Seizure disorder     Grand mal seizure 7/12, [facility]  . Anemia   . Allergic rhinitis, cause unspecified   . Major depressive disorder, single episode, severe, without mention of psychotic behavior   . Arthritis of back   . Atrial fibrillation     on Multaq  . Asthma   . Osteoporosis     DEXA 04/2011: -3.1 L fem, on Boniva  . Diverticulosis     Review of Systems  Constitutional: Positive for fatigue. Negative for fever and unexpected weight change.  HENT: Positive for congestion. Negative for sneezing, postnasal drip and sinus pressure.   Respiratory: Positive for cough. Negative for chest tightness, shortness of breath and wheezing.        Objective:   Physical Exam  BP 110/68  Pulse 96  Temp 97 F (36.1 C) (Oral)  Ht 5\' 3"  (1.6 m)  Wt 159 lb (72.122 kg)  BMI 28.17 kg/m2  SpO2 97% Wt Readings from Last 3 Encounters:  05/10/12 159 lb (72.122 kg)  04/25/12 164 lb (74.39 kg)  04/12/12 163 lb (73.936 kg)   Constitutional: She appears well-developed and well-nourished. No distress.  Neck: Normal range of motion. Neck supple. No JVD present. No thyromegaly present.  Cardiovascular: normal rate, irregular  rhythm, normal sounds.  No murmur heard. No BLE edema. Pulmonary/Chest: Effort normal, breath sounds with rhonchi and congested cough. No respiratory distress. She has no wheezes.  Abdominal: Soft. Bowel sounds are normal. She exhibits no distension. There is no tenderness. no masses  Lab Results  Component Value Date   WBC 8.5 04/29/2012   HGB 11.6* 04/29/2012   HCT 34.0* 04/29/2012   PLT 187 04/29/2012   GLUCOSE 102* 04/29/2012   CHOL 138 06/08/2011   TRIG 69.0 06/08/2011   HDL 55.60 06/08/2011   LDLDIRECT 137.0 10/23/2008   LDLCALC 69 06/08/2011   ALT 29 12/28/2011   AST 23 12/28/2011   NA 141 04/29/2012   K 4.0 04/29/2012   CL 104 04/29/2012   CREATININE 1.10 04/29/2012   BUN 21 04/29/2012   CO2 27 02/09/2012   TSH 0.37 06/23/2011   INR 1.0 01/03/2009   HGBA1C 5.7 02/09/2012   Dg Chest 2 View  04/29/2012  *RADIOLOGY REPORT*  Clinical Data: Cough, fever.  CHEST - 2 VIEW  Comparison: 06/17/2010  Findings: Airspace opacity noted anteriorly on the lateral view which is likely in the lingula on the frontal view concerning for pneumonia.  No effusions.  Heart is normal size.  No acute bony abnormality.  IMPRESSION: Airspace disease anteriorly on the lateral view, likely lingular infiltrate/pneumonia.  Original Report Authenticated By: Charlett Nose, M.D.      Assessment & Plan:  See problem list. Medications and labs reviewed today.  PNA on CXR 11/29 - has not yet begun antibiotics due to concerns for ?med side effects - change to  Time spent with pt today 25 minutes, greater than 50% time spent counseling patient on chronic medical issues and medication review. Also review of interval records

## 2012-05-10 NOTE — Assessment & Plan Note (Signed)
Stable without active flare despite PNA infection The current medical regimen is effective;  continue present plan and medications.  Follow with pulm as planned

## 2012-05-10 NOTE — Patient Instructions (Signed)
It was good to see you today. We have reviewed your prior records including labs and tests today Do not take Levaquin Use 2 antibiotics: Omnicef AND Doxycyline 2x/day for 1 week - Resume benzonate for cough symptoms 3x/day as needed Your prescription(s) have been submitted to your pharmacy. Please take as directed and contact our office if you believe you are having problem(s) with the medication(s). Other Medications reviewed and updated, no other changes at this time.

## 2012-05-10 NOTE — Assessment & Plan Note (Signed)
On Multaq > maintaining normal sinus occassionally symptomatic palpitations at times (but overall improved) On aspirin therapy, as pt high-risk for Coumadin because of seizure disorder Follows with cardiology for same, no current symptoms  Careful with antibiotics selection due to antiarrythmic agent   

## 2012-05-30 ENCOUNTER — Other Ambulatory Visit: Payer: Self-pay | Admitting: Internal Medicine

## 2012-06-08 ENCOUNTER — Ambulatory Visit (INDEPENDENT_AMBULATORY_CARE_PROVIDER_SITE_OTHER): Payer: Medicare Other | Admitting: Cardiology

## 2012-06-08 ENCOUNTER — Encounter: Payer: Self-pay | Admitting: Internal Medicine

## 2012-06-08 VITALS — BP 120/68 | HR 82 | Ht 63.0 in | Wt 161.0 lb

## 2012-06-08 DIAGNOSIS — I4891 Unspecified atrial fibrillation: Secondary | ICD-10-CM

## 2012-06-08 DIAGNOSIS — I2510 Atherosclerotic heart disease of native coronary artery without angina pectoris: Secondary | ICD-10-CM

## 2012-06-08 LAB — BASIC METABOLIC PANEL
CO2: 29 mEq/L (ref 19–32)
Chloride: 108 mEq/L (ref 96–112)
Creatinine, Ser: 1.1 mg/dL (ref 0.4–1.2)
Potassium: 4.2 mEq/L (ref 3.5–5.1)

## 2012-06-08 NOTE — Progress Notes (Signed)
HPI:  Patient seen in followup. From a cardiac standpoint she has been stable she's not been having any specific cardiac complaints. She's not feel like she's been out of rhythm. We discussed in extensive detail her current management. We discussed her anticoagulation recommendations. Notably, the patient has a history of seizure disorder and had a rather dramatic seizure last year in [place]. She also says that she is not having as much diarrhea she had in the past. Has no other complaints.  Current Outpatient Prescriptions  Medication Sig Dispense Refill  . acetaminophen (TYLENOL) 500 MG tablet Take 500 mg by mouth every 6 (six) hours as needed.       Marland Kitchen albuterol (PROAIR HFA) 108 (90 BASE) MCG/ACT inhaler Inhale 2 puffs into the lungs every 4 (four) hours as needed for wheezing.  1 Inhaler  1  . aspirin 325 MG tablet Take 325 mg by mouth daily.        . beclomethasone (QVAR) 40 MCG/ACT inhaler Inhale 2 puffs into the lungs 2 (two) times daily.  8.7 g  11  . calcium carbonate (OS-CAL - DOSED IN MG OF ELEMENTAL CALCIUM) 1250 MG tablet Take 1 tablet by mouth 3 (three) times daily.      . diclofenac (VOLTAREN) 75 MG EC tablet TAKE 1 TABLET BY MOUTH TWICE DAILY AS NEEDED  60 tablet  0  . dronedarone (MULTAQ) 400 MG tablet Take 1 tablet (400 mg total) by mouth 2 (two) times daily with a meal.  60 tablet  11  . fluticasone (FLONASE) 50 MCG/ACT nasal spray PLACE 2 SPRAYS INTO THE NOSE DAILY AS DIRECTED  16 g  5  . ibandronate (BONIVA) 150 MG tablet Take 150 mg by mouth every 30 (thirty) days. Take in the morning with a full glass of water, on an empty stomach, and do not take anything else by mouth or lie down for the next 30 min.       . levETIRAcetam (KEPPRA) 500 MG tablet Take 1 tablet (500 mg total) by mouth 2 (two) times daily.  60 tablet  1  . loperamide (IMODIUM A-D) 2 MG tablet Take 2 mg by mouth 3 (three) times daily as needed. For diarrhea      . metoprolol succinate (TOPROL-XL) 50 MG  24 hr tablet TAKE 1 TABLET BY MOUTH EVERY DAY  30 tablet  9  . Multiple Vitamin (MULTIVITAMIN WITH MINERALS) TABS Take 1 tablet by mouth daily.      Marland Kitchen NITROSTAT 0.4 MG SL tablet DISSOLVE 1 TABLET UNDER THE TONGUE EVERY 5 MINUTES AS NEEDED FOR CHEST PAIN, MAY REPEAT THREE TIMES  25 tablet  3  . PHENobarbital (LUMINAL) 64.8 MG tablet Take 64.8 mg by mouth 2 (two) times daily.       . potassium chloride (K-DUR,KLOR-CON) 10 MEQ tablet TAKE 1 TABLET BY MOUTH DAILY  30 tablet  10  . Probiotic Product (ALIGN PO) Take 1 tablet by mouth daily.      . ranitidine (ZANTAC) 150 MG tablet Take 1 tablet (150 mg total) by mouth 2 (two) times daily.  60 tablet  3  . rosuvastatin (CRESTOR) 20 MG tablet Take 1 tablet (20 mg total) by mouth daily.  30 tablet  3  . sodium chloride (OCEAN) 0.65 % nasal spray 3 sprays by Nasal route 3 (three) times daily.       . vitamin C (ASCORBIC ACID) 500 MG tablet Take 500 mg by mouth daily.      . [  DISCONTINUED] potassium chloride (KLOR-CON) 10 MEQ CR tablet Take 1 tablet (10 mEq total) by mouth daily.  30 tablet  11    Allergies  Allergen Reactions  . Codeine Other (See Comments)    Caused possible hallucinations and anxiety  . Latex Rash  . Neomycin-Bacitracin Zn-Polymyx Itching and Rash  . Penicillins Swelling    Lips    Past Medical History  Diagnosis Date  . CAD (coronary artery disease)     nonobst cath 12/2008  . HLD (hyperlipidemia)   . Calculus of gallbladder without mention of cholecystitis or obstruction 03/2011    s/p lap chole  . IBS (irritable bowel syndrome)   . GERD (gastroesophageal reflux disease)   . Tinnitus   . Seizure disorder     Grand mal seizure 7/12, [facility]  . Anemia   . Allergic rhinitis, cause unspecified   . Major depressive disorder, single episode, severe, without mention of psychotic behavior   . Arthritis of back   . Atrial fibrillation     on Multaq  . Asthma   . Osteoporosis     DEXA 04/2011: -3.1 L fem, on Boniva    . Diverticulosis     Past Surgical History  Procedure Date  . Total abdominal hysterectomy   . Wrist fracture surgery     with plates (left)  . Tonsillectomy     age 67  . Cholecystectomy 03/25/2011    Family History  Problem Relation Age of Onset  . Angina Mother   . Alzheimer's disease Mother   . Heart attack Father   . Heart failure Father   . Heart disease Father   . Heart disease Brother   . Breast cancer Maternal Aunt   . Cancer Maternal Aunt     breast  . Cervical cancer Maternal Aunt   . Cancer Maternal Aunt     ovarian  . Colon cancer Neg Hx     History   Social History  . Marital Status: Divorced    Spouse Name: N/A    Number of Children: 0  . 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     Social History Main Topics  . Smoking status: Never Smoker   . Smokeless tobacco: Never Used  . Alcohol Use: No     Comment: quit 2012  . Drug Use: No  . Sexually Active: No   Other Topics Concern  . Not on file   Social History Narrative  . No narrative on file    ROS: Please see the HPI.  All other systems reviewed and negative.  PHYSICAL EXAM:  BP 120/68  Pulse 82  Ht 5\' 3"  (1.6 m)  Wt 161 lb (73.029 kg)  BMI 28.52 kg/m2  SpO2 95%  General: Well developed, well nourished, in no acute distress. Head:  Normocephalic and atraumatic. Neck: no JVD Lungs: Clear to auscultation and percussion. Heart: Normal S1 and S2.  No murmur, rubs or gallops.  Pulses: Pulses normal in all 4 extremities. Extremities: No clubbing or cyanosis. No edema. Neurologic: Alert and oriented x 3.  EKG:  NSR.  Nonspecific T flattening  ASSESSMENT AND PLAN:

## 2012-06-08 NOTE — Assessment & Plan Note (Addendum)
Patient remains on multaq. She overall has done well. She does not feel like she is been out of rhythm. She will continue on the current medical regimen at the present time.  Her overall CHADS score is not prohibitive, and she has some anticoag risk.  Will continue to follow her in the clinic.

## 2012-06-08 NOTE — Assessment & Plan Note (Signed)
Patient has no current cardiac symptoms.

## 2012-06-08 NOTE — Patient Instructions (Addendum)
Lab work today We will call you with results. 

## 2012-06-13 ENCOUNTER — Ambulatory Visit (INDEPENDENT_AMBULATORY_CARE_PROVIDER_SITE_OTHER): Payer: Medicare Other | Admitting: Internal Medicine

## 2012-06-13 ENCOUNTER — Encounter: Payer: Self-pay | Admitting: Internal Medicine

## 2012-06-13 ENCOUNTER — Encounter: Payer: Self-pay | Admitting: Cardiology

## 2012-06-13 VITALS — BP 120/72 | HR 76 | Temp 98.0°F | Ht 63.0 in | Wt 160.1 lb

## 2012-06-13 DIAGNOSIS — I4891 Unspecified atrial fibrillation: Secondary | ICD-10-CM

## 2012-06-13 DIAGNOSIS — N318 Other neuromuscular dysfunction of bladder: Secondary | ICD-10-CM

## 2012-06-13 DIAGNOSIS — N390 Urinary tract infection, site not specified: Secondary | ICD-10-CM

## 2012-06-13 DIAGNOSIS — N3281 Overactive bladder: Secondary | ICD-10-CM

## 2012-06-13 LAB — POCT URINALYSIS DIPSTICK
Bilirubin, UA: NEGATIVE
Ketones, UA: NEGATIVE
Protein, UA: NEGATIVE
Spec Grav, UA: 1.02

## 2012-06-13 MED ORDER — CEPHALEXIN 500 MG PO CAPS: 500.0000 mg | ORAL_CAPSULE | Freq: Three times a day (TID) | ORAL | Status: DC

## 2012-06-13 MED ORDER — SOLIFENACIN SUCCINATE 5 MG PO TABS: 5.0000 mg | ORAL_TABLET | Freq: Every day | ORAL | Status: DC

## 2012-06-13 NOTE — Patient Instructions (Signed)
It was good to see you today. Keflex antibiotics x 1 week and start Vesicaire once daily everyday for bladder irritability Your prescription(s) have been submitted to your pharmacy. Please take as directed and contact our office if you believe you are having problem(s) with the medication(s).

## 2012-06-13 NOTE — Assessment & Plan Note (Signed)
On Multaq > maintaining normal sinus occassionally symptomatic palpitations at times (but overall improved) On aspirin therapy, as pt high-risk for Coumadin because of seizure disorder Follows with cardiology for same, no current symptoms  Careful with antibiotics selection due to antiarrythmic agent

## 2012-06-13 NOTE — Progress Notes (Signed)
Subjective:    Patient ID: Misty Meyer, female    DOB: [birth year], 72 y.o.   MRN: [id number]  Urinary Tract Infection  This is a new problem. The current episode started in the past 7 days. The problem occurs every urination. The problem has been gradually worsening. The quality of the pain is described as aching and burning. The pain is moderate. There has been no fever. There is no history of pyelonephritis. Associated symptoms include frequency and urgency. Pertinent negatives include no chills, discharge, flank pain, hematuria, nausea, possible pregnancy or vomiting. She has tried increased fluids for the symptoms. The treatment provided mild relief. Her past medical history is significant for recurrent UTIs. There is no history of kidney stones or a urological procedure.   Also reviewed chronic medical issues:   history of nonobstructive CAD, cardiac catheterization 8/10: Vigorous LV function, ostial LAD 50%  paroxysmal atrial fibrillation prompting admissions to the hospital 2/11, 10/11 and 1/12.  She was placed on Multaq after presentation to the hospital 1/12.  She has been maintained on aspirin only.  She has remained in sinus rhythm since that time.    seizure disorder - follows with neuro for same - grand mal seizure summer 2012, now on Keppra in addition to Phenobarbital.    Hyperlipidemia - on statin - follows with cardiology   GERD, chronic diarrhea - on carafate    Past Medical History  Diagnosis Date  . CAD (coronary artery disease)     nonobst cath 12/2008  . HLD (hyperlipidemia)   . Calculus of gallbladder without mention of cholecystitis or obstruction 03/2011    s/p lap chole  . IBS (irritable bowel syndrome)   . GERD (gastroesophageal reflux disease)   . Tinnitus   . Seizure disorder     Grand mal seizure 7/12, [facility]  . Anemia   . Allergic rhinitis, cause unspecified   . Major depressive disorder, single episode, severe, without mention of psychotic  behavior   . Arthritis of back   . Atrial fibrillation     on Multaq  . Asthma   . Osteoporosis     DEXA 04/2011: -3.1 L fem, on Boniva  . Diverticulosis     Review of Systems  Constitutional: Negative for fever and chills.  Gastrointestinal: Negative for nausea and vomiting.  Genitourinary: Positive for urgency, frequency and decreased urine volume. Negative for dysuria, hematuria, flank pain, enuresis and difficulty urinating.       Objective:   Physical Exam  BP 120/72  Pulse 76  Temp 98 F (36.7 C) (Oral)  Ht 5\' 3"  (1.6 m)  Wt 160 lb 1.9 oz (72.63 kg)  BMI 28.36 kg/m2  SpO2 97% Wt Readings from Last 3 Encounters:  06/13/12 160 lb 1.9 oz (72.63 kg)  06/08/12 161 lb (73.029 kg)  05/10/12 159 lb (72.122 kg)   Constitutional: She appears well-developed and well-nourished. No distress.  Neck: Normal range of motion. Neck supple. No JVD present. No thyromegaly present.  Cardiovascular: Normal rate, regular rhythm and normal heart sounds.  No murmur heard. No BLE edema. Pulmonary/Chest: Effort normal and breath sounds normal. No respiratory distress. She has no wheezes.  Abd: mild suprapubic tenderness to palpation, no r/g +BS GU: deferred Psychiatric: She has a normal mood and affect. Her behavior is normal. Judgment and thought content normal.   Lab Results  Component Value Date   WBC 8.5 04/29/2012   HGB 11.6* 04/29/2012   HCT 34.0* 04/29/2012  PLT 187 04/29/2012   GLUCOSE 92 06/08/2012   CHOL 138 06/08/2011   TRIG 69.0 06/08/2011   HDL 55.60 06/08/2011   LDLDIRECT 137.0 10/23/2008   LDLCALC 69 06/08/2011   ALT 29 12/28/2011   AST 23 12/28/2011   NA 140 06/08/2012   K 4.2 06/08/2012   CL 108 06/08/2012   CREATININE 1.1 06/08/2012   BUN 37* 06/08/2012   CO2 29 06/08/2012   TSH 0.37 06/23/2011   INR 1.0 01/03/2009   HGBA1C 5.7 02/09/2012        Assessment & Plan:   See problem list. Medications and labs reviewed today.  UTI - +Udip LE - antibiotics and supportive care -  education provided  Also start vesicare for OAB symptoms complicating chronic urinary symptoms - remote uro eval for same - detrol XL prev rx'd, but not on same for many years

## 2012-06-13 NOTE — Telephone Encounter (Signed)
Pt would like results of labs from 1/8 and she will be out so you can leave a message

## 2012-06-13 NOTE — Telephone Encounter (Signed)
This encounter was created in error - please disregard.

## 2012-07-01 ENCOUNTER — Other Ambulatory Visit: Payer: Self-pay | Admitting: *Deleted

## 2012-07-01 DIAGNOSIS — I4891 Unspecified atrial fibrillation: Secondary | ICD-10-CM

## 2012-07-01 DIAGNOSIS — D649 Anemia, unspecified: Secondary | ICD-10-CM

## 2012-07-04 ENCOUNTER — Other Ambulatory Visit (INDEPENDENT_AMBULATORY_CARE_PROVIDER_SITE_OTHER): Payer: Medicare Other

## 2012-07-04 DIAGNOSIS — I4891 Unspecified atrial fibrillation: Secondary | ICD-10-CM

## 2012-07-04 DIAGNOSIS — D649 Anemia, unspecified: Secondary | ICD-10-CM

## 2012-07-04 LAB — BASIC METABOLIC PANEL
CO2: 29 mEq/L (ref 19–32)
Calcium: 8.7 mg/dL (ref 8.4–10.5)
Chloride: 106 mEq/L (ref 96–112)
Glucose, Bld: 83 mg/dL (ref 70–99)
Potassium: 3.8 mEq/L (ref 3.5–5.1)
Sodium: 140 mEq/L (ref 135–145)

## 2012-07-05 ENCOUNTER — Other Ambulatory Visit: Payer: Self-pay | Admitting: Internal Medicine

## 2012-07-18 ENCOUNTER — Other Ambulatory Visit: Payer: Self-pay | Admitting: Internal Medicine

## 2012-07-18 ENCOUNTER — Encounter: Payer: Self-pay | Admitting: Internal Medicine

## 2012-07-18 ENCOUNTER — Ambulatory Visit (INDEPENDENT_AMBULATORY_CARE_PROVIDER_SITE_OTHER): Payer: Medicare Other | Admitting: Internal Medicine

## 2012-07-18 VITALS — BP 130/72 | HR 89 | Temp 97.0°F | Wt 163.8 lb

## 2012-07-18 DIAGNOSIS — N318 Other neuromuscular dysfunction of bladder: Secondary | ICD-10-CM

## 2012-07-18 DIAGNOSIS — N390 Urinary tract infection, site not specified: Secondary | ICD-10-CM

## 2012-07-18 DIAGNOSIS — N3281 Overactive bladder: Secondary | ICD-10-CM

## 2012-07-18 DIAGNOSIS — G40909 Epilepsy, unspecified, not intractable, without status epilepticus: Secondary | ICD-10-CM

## 2012-07-18 MED ORDER — CEPHALEXIN 500 MG PO CAPS: 500.0000 mg | ORAL_CAPSULE | Freq: Three times a day (TID) | ORAL | Status: DC

## 2012-07-18 NOTE — Progress Notes (Signed)
Subjective:    Patient ID: Misty Meyer, female    DOB: 01-07-1941, 72 y.o.   MRN: [id number]  Urinary Tract Infection  This is a new problem. The current episode started in the past 7 days. The problem occurs intermittently. The problem has been unchanged. The quality of the pain is described as aching and burning. The pain is moderate. There has been no fever. There is no history of pyelonephritis. Associated symptoms include frequency and urgency. Pertinent negatives include no chills, discharge, flank pain, hematuria, nausea, possible pregnancy or vomiting. She has tried increased fluids for the symptoms. The treatment provided mild relief. Her past medical history is significant for recurrent UTIs. There is no history of kidney stones or a urological procedure.   Also reviewed chronic medical issues:   history of nonobstructive CAD, cardiac catheterization 8/10: Vigorous LV function, ostial LAD 50%  paroxysmal atrial fibrillation prompting admissions to the hospital 2/11, 10/11 and 1/12.  She was placed on Multaq after presentation to the hospital 1/12.  She has been maintained on aspirin only.  She has remained in sinus rhythm since that time.    seizure disorder - follows with neuro for same - grand mal seizure summer 2012, now on Keppra in addition to Phenobarbital.    Hyperlipidemia - on statin - follows with cardiology   GERD, chronic diarrhea - on carafate    Past Medical History  Diagnosis Date  . CAD (coronary artery disease)     nonobst cath 12/2008  . HLD (hyperlipidemia)   . Calculus of gallbladder without mention of cholecystitis or obstruction 03/2011    s/p lap chole  . IBS (irritable bowel syndrome)   . GERD (gastroesophageal reflux disease)   . Tinnitus   . Seizure disorder     Grand mal seizure 7/12, [facility]  . Anemia   . Allergic rhinitis, cause unspecified   . Major depressive disorder, single episode, severe, without mention of psychotic behavior    . Arthritis of back   . Atrial fibrillation     on Multaq  . Asthma   . Osteoporosis     DEXA 04/2011: -3.1 L fem, on Boniva  . Diverticulosis     Review of Systems  Constitutional: Negative for fever and chills.  Gastrointestinal: Negative for nausea and vomiting.  Genitourinary: Positive for urgency, frequency and decreased urine volume. Negative for dysuria, hematuria, flank pain, enuresis and difficulty urinating.       Objective:   Physical Exam  BP 130/72  Pulse 89  Temp(Src) 97 F (36.1 C) (Oral)  Wt 163 lb 12.8 oz (74.299 kg)  BMI 29.02 kg/m2  SpO2 96% Wt Readings from Last 3 Encounters:  07/18/12 163 lb 12.8 oz (74.299 kg)  06/13/12 160 lb 1.9 oz (72.63 kg)  06/08/12 161 lb (73.029 kg)   Constitutional: She appears well-developed and well-nourished. No distress.  Neck: Normal range of motion. Neck supple. No JVD present. No thyromegaly present.  Cardiovascular: Normal rate, regular rhythm and normal heart sounds.  No murmur heard. No BLE edema. Pulmonary/Chest: Effort normal and breath sounds normal. No respiratory distress. She has no wheezes.  Abd: mild suprapubic tenderness to palpation, no r/g +BS GU: deferred Psychiatric: She has a normal mood and affect. Her behavior is normal. Judgment and thought content normal.   Lab Results  Component Value Date   WBC 8.5 04/29/2012   HGB 11.6* 04/29/2012   HCT 34.0* 04/29/2012   PLT 187 04/29/2012   GLUCOSE  83 07/04/2012   CHOL 138 06/08/2011   TRIG 69.0 06/08/2011   HDL 55.60 06/08/2011   LDLDIRECT 137.0 10/23/2008   LDLCALC 69 06/08/2011   ALT 29 12/28/2011   AST 23 12/28/2011   NA 140 07/04/2012   K 3.8 07/04/2012   CL 106 07/04/2012   CREATININE 1.0 07/04/2012   BUN 23 07/04/2012   CO2 29 07/04/2012   TSH 0.37 06/23/2011   INR 1.0 01/03/2009   HGBA1C 5.7 02/09/2012        Assessment & Plan:   See problem list. Medications and labs reviewed today.  UTI: classic symptoms and hx same; but unable to provided urine sample  -  antibiotics and supportive care - education provided

## 2012-07-18 NOTE — Assessment & Plan Note (Signed)
Long history of petit mal seizures> episode of grand mal event June 2012 Follows with neurology Dr. Anne Hahn for same Medications adjusted following grand mal seizure> no recurrent breakthrough seizures since that time ?changing dose of phenobarb per pharmacy - defer to neuro

## 2012-07-18 NOTE — Patient Instructions (Signed)
It was good to see you today. Keflex antibiotics x 1 week and start Vesicaire once daily everyday for bladder irritability Your prescription(s) have been submitted to your pharmacy. Please take as directed and contact our office if you believe you are having problem(s) with the medication(s). Other Medications reviewed, no additional changes at this time.

## 2012-07-18 NOTE — Assessment & Plan Note (Signed)
started vesicare 06/2012 for overlapping OAB symptoms complicating chronic urinary symptoms -  remote uro eval for same - detrol XL prev rx'd, but not on same for many years

## 2012-07-19 NOTE — Telephone Encounter (Signed)
Faxed script back to [facility].../lmb 

## 2012-08-20 ENCOUNTER — Other Ambulatory Visit: Payer: Self-pay | Admitting: Internal Medicine

## 2012-08-23 ENCOUNTER — Other Ambulatory Visit: Payer: Self-pay | Admitting: *Deleted

## 2012-08-23 MED ORDER — POTASSIUM CHLORIDE CRYS ER 10 MEQ PO TBCR: 10.0000 meq | EXTENDED_RELEASE_TABLET | Freq: Every day | ORAL | Status: DC

## 2012-08-25 ENCOUNTER — Telehealth: Payer: Self-pay | Admitting: Cardiology

## 2012-08-25 NOTE — Telephone Encounter (Signed)
New Problem:    Patient called in wanting to speak with you about her potassium chloride (K-DUR,KLOR-CON) 10 MEQ tablet possibly being mislabeled.  Please call back.

## 2012-08-26 ENCOUNTER — Other Ambulatory Visit: Payer: Self-pay | Admitting: Internal Medicine

## 2012-08-26 NOTE — Telephone Encounter (Signed)
I spoke with the pt and she received her Rx for potassium and it said Klor-con on the bottle.  I made the pt aware that this is one of the alternative names for potassium chloride and that she is okay to take this medication. Pt verbalized understanding.

## 2012-09-12 ENCOUNTER — Encounter: Payer: Self-pay | Admitting: Nurse Practitioner

## 2012-09-12 ENCOUNTER — Ambulatory Visit (INDEPENDENT_AMBULATORY_CARE_PROVIDER_SITE_OTHER): Payer: Medicare Other | Admitting: Nurse Practitioner

## 2012-09-12 VITALS — BP 100/60 | HR 75 | Ht 64.0 in | Wt 167.0 lb

## 2012-09-12 DIAGNOSIS — I4891 Unspecified atrial fibrillation: Secondary | ICD-10-CM

## 2012-09-12 DIAGNOSIS — G40309 Generalized idiopathic epilepsy and epileptic syndromes, not intractable, without status epilepticus: Secondary | ICD-10-CM

## 2012-09-12 DIAGNOSIS — G40909 Epilepsy, unspecified, not intractable, without status epilepticus: Secondary | ICD-10-CM

## 2012-09-12 MED ORDER — LEVETIRACETAM 500 MG PO TABS: 500.0000 mg | ORAL_TABLET | Freq: Two times a day (BID) | ORAL | Status: DC

## 2012-09-12 NOTE — Progress Notes (Signed)
HPI: Patient returns for followup after her last visit 03/15/2012. She has a history of seizure disorder since childhood and is well controlled on phenobarbital and Keppra without side effects to the medication last seizure was in the summer of 2012. Keppra was added at that time. No further seizure activity. No new neurologic complaints ROS:  - swelling in legs, diarrhea and constipation sometimes, increased thirst, seizure, anxiety , change in appetite  Physical Exam General: well developed, well nourished, seated, in no evident distress Head: head normocephalic and atraumatic. Oropharynx benign Neck: supple with no carotid or supraclavicular bruits Cardiovascular: regular rate and rhythm, no murmurs  Neurologic Exam Mental Status: Awake and fully alert. Oriented to place and time. Recent and remote memory intact. Attention span, concentration and fund of knowledge appropriate. Mood and affect appropriate.  Cranial Nerves: Fundoscopic exam reveals sharp disc margins. Pupils equal, briskly reactive to light. Extraocular movements full without nystagmus. Visual fields full to confrontation. Hearing intact and symmetric to finger snap. Facial sensation intact. Face, tongue, palate move normally and symmetrically. Neck flexion and extension normal.  Motor: Normal bulk and tone. Normal strength in all tested extremity muscles. Sensory.: intact to touch and pinprick and vibratory.  Coordination: Rapid alternating movements normal in all extremities. Finger-to-nose and heel-to-shin performed accurately bilaterally. Gait and Station: Arises from chair without difficulty. Stance is normal. Gait demonstrates normal stride length and balance . Able to heel, toe and tandem walk without difficulty.  Reflexes: 1+ and symmetric. Toes downgoing.     ASSESSMENT: Seizure disorder in excellent control with Keppra and phenobarbital. Phenobarbital level was just checked in October. Routine  labs are drawn at  primary care physician.     PLAN: Will renew Keppra.  Continue phenobarbital as ordered Followup yearly Fall risk=11, be careful with ambulation to prevent falls.   Nilda Riggs, GNP-BC APRN

## 2012-09-12 NOTE — Progress Notes (Signed)
I reviewed note and agree with plan.   Suanne Marker, MD 09/12/2012, 1:24 PM Certified in Neurology, Neurophysiology and Neuroimaging  [facility] [address], [address] [place], [place] [address] [phone]

## 2012-09-12 NOTE — Patient Instructions (Addendum)
Continue Keppra and Phenobarb, will renew Keppra. F/U yearly

## 2012-09-15 ENCOUNTER — Other Ambulatory Visit: Payer: Self-pay | Admitting: *Deleted

## 2012-09-15 DIAGNOSIS — I4891 Unspecified atrial fibrillation: Secondary | ICD-10-CM

## 2012-09-15 MED ORDER — DRONEDARONE HCL 400 MG PO TABS: 400.0000 mg | ORAL_TABLET | Freq: Two times a day (BID) | ORAL | Status: DC

## 2012-09-20 ENCOUNTER — Other Ambulatory Visit: Payer: Self-pay | Admitting: *Deleted

## 2012-09-20 MED ORDER — ROSUVASTATIN CALCIUM 20 MG PO TABS: 20.0000 mg | ORAL_TABLET | Freq: Every day | ORAL | Status: DC

## 2012-09-23 ENCOUNTER — Ambulatory Visit: Payer: Medicare Other | Admitting: Internal Medicine

## 2012-09-30 ENCOUNTER — Encounter: Payer: Self-pay | Admitting: Internal Medicine

## 2012-09-30 ENCOUNTER — Ambulatory Visit (INDEPENDENT_AMBULATORY_CARE_PROVIDER_SITE_OTHER): Payer: Medicare Other | Admitting: Internal Medicine

## 2012-09-30 VITALS — BP 122/78 | HR 80 | Temp 97.5°F | Wt 171.0 lb

## 2012-09-30 DIAGNOSIS — N390 Urinary tract infection, site not specified: Secondary | ICD-10-CM

## 2012-09-30 LAB — POCT URINALYSIS DIPSTICK
Bilirubin, UA: NEGATIVE
Glucose, UA: NEGATIVE
Ketones, UA: NEGATIVE
Spec Grav, UA: 1.02

## 2012-09-30 MED ORDER — ALBUTEROL SULFATE HFA 108 (90 BASE) MCG/ACT IN AERS: 2.0000 | INHALATION_SPRAY | RESPIRATORY_TRACT | Status: AC | PRN

## 2012-09-30 MED ORDER — SOLIFENACIN SUCCINATE 5 MG PO TABS: 5.0000 mg | ORAL_TABLET | Freq: Every day | ORAL | Status: DC

## 2012-09-30 MED ORDER — POTASSIUM CHLORIDE CRYS ER 10 MEQ PO TBCR: 10.0000 meq | EXTENDED_RELEASE_TABLET | Freq: Every day | ORAL | Status: DC

## 2012-09-30 MED ORDER — CEPHALEXIN 500 MG PO CAPS: 500.0000 mg | ORAL_CAPSULE | Freq: Three times a day (TID) | ORAL | Status: DC

## 2012-09-30 MED ORDER — FLUTICASONE PROPIONATE 50 MCG/ACT NA SUSP: NASAL | Status: DC

## 2012-09-30 MED ORDER — METOPROLOL SUCCINATE ER 50 MG PO TB24: ORAL_TABLET | ORAL | Status: DC

## 2012-09-30 MED ORDER — RANITIDINE HCL 150 MG PO TABS: ORAL_TABLET | ORAL | Status: DC

## 2012-09-30 MED ORDER — ROSUVASTATIN CALCIUM 20 MG PO TABS: 20.0000 mg | ORAL_TABLET | Freq: Every day | ORAL | Status: DC

## 2012-09-30 MED ORDER — IBANDRONATE SODIUM 150 MG PO TABS: 150.0000 mg | ORAL_TABLET | ORAL | Status: DC

## 2012-09-30 NOTE — Patient Instructions (Signed)
It was good to see you today. Keflex antibiotics x 1 week for bladder infection Your prescription(s) (plus one refill) have been submitted to your pharmacy. Please take as directed and contact our office if you believe you are having problem(s) with the medication(s). Other Medications reviewed, no additional changes at this time.

## 2012-09-30 NOTE — Progress Notes (Signed)
  Subjective:    Patient ID: Misty Meyer, female    DOB: 01/22/1941, 72 y.o.   MRN: [id number]  complains of UTI symptoms See PA student note as well   Past Medical History  Diagnosis Date  . CAD (coronary artery disease)     nonobst cath 12/2008  . HLD (hyperlipidemia)   . Calculus of gallbladder without mention of cholecystitis or obstruction 03/2011    s/p lap chole  . IBS (irritable bowel syndrome)   . GERD (gastroesophageal reflux disease)   . Tinnitus   . Seizure disorder     Grand mal seizure 7/12, [facility]  . Anemia   . Allergic rhinitis, cause unspecified   . Major depressive disorder, single episode, severe, without mention of psychotic behavior   . Arthritis of back   . Atrial fibrillation     on Multaq  . Asthma   . Osteoporosis     DEXA 04/2011: -3.1 L fem, on Boniva  . Diverticulosis   . Seizures     Review of Systems  Constitutional: Negative for fever and chills.  Gastrointestinal: Negative for nausea and vomiting.  Genitourinary: Positive for urgency, frequency and decreased urine volume. Negative for dysuria, hematuria, flank pain, enuresis and difficulty urinating.       Objective:   Physical Exam  BP 122/78  Pulse 80  Temp(Src) 97.5 F (36.4 C) (Oral)  Wt 171 lb (77.565 kg)  BMI 29.34 kg/m2  SpO2 96% Wt Readings from Last 3 Encounters:  09/30/12 171 lb (77.565 kg)  09/12/12 167 lb (75.751 kg)  07/18/12 163 lb 12.8 oz (74.299 kg)   Constitutional: She appears well-developed and well-nourished. No distress.  Neck: Normal range of motion. Neck supple. No JVD present. No thyromegaly present.  Cardiovascular: Normal rate, regular rhythm and normal heart sounds.  No murmur heard. No BLE edema. Pulmonary/Chest: Effort normal and breath sounds normal. No respiratory distress. She has no wheezes.  Abd: mild suprapubic tenderness to palpation, no r/g +BS GU: deferred Psychiatric: She has a normal mood and affect. Her behavior is normal.  Judgment and thought content normal.   Lab Results  Component Value Date   WBC 8.5 04/29/2012   HGB 11.6* 04/29/2012   HCT 34.0* 04/29/2012   PLT 187 04/29/2012   GLUCOSE 83 07/04/2012   CHOL 138 06/08/2011   TRIG 69.0 06/08/2011   HDL 55.60 06/08/2011   LDLDIRECT 137.0 10/23/2008   LDLCALC 69 06/08/2011   ALT 29 12/28/2011   AST 23 12/28/2011   NA 140 07/04/2012   K 3.8 07/04/2012   CL 106 07/04/2012   CREATININE 1.0 07/04/2012   BUN 23 07/04/2012   CO2 29 07/04/2012   TSH 0.37 06/23/2011   INR 1.0 01/03/2009   HGBA1C 5.7 02/09/2012   + POC U dip today     Assessment & Plan:   See problem list. Medications and labs reviewed today.  UTI: classic symptoms and hx same; POC U dip +, consistent with symptoms  -  antibiotics and supportive care - education provided

## 2012-09-30 NOTE — Progress Notes (Signed)
Subjective:    Patient ID: Misty Meyer, female    DOB: Apr 24, 1941, 72 y.o.   MRN: [id number]  HPI  Patient presents to the office today because she feels like she has a UTI since last week.  She feels as though she has had increased frequency, urgency, suprapubic pressure, low back pain, and some mild right flank pain.  Denies gross hematuria, dysuria, fever, chills.  Has had UTIs in the past that have been successfully treated with Keflex in the past.  Has a busy summer planned and would like some refills of the Keflex so that she can take them with her.  Would like some samples or coupons for Align.    Past Medical History  Diagnosis Date  . CAD (coronary artery disease)     nonobst cath 12/2008  . HLD (hyperlipidemia)   . Calculus of gallbladder without mention of cholecystitis or obstruction 03/2011    s/p lap chole  . IBS (irritable bowel syndrome)   . GERD (gastroesophageal reflux disease)   . Tinnitus   . Seizure disorder     Grand mal seizure 7/12, [facility]  . Anemia   . Allergic rhinitis, cause unspecified   . Major depressive disorder, single episode, severe, without mention of psychotic behavior   . Arthritis of back   . Atrial fibrillation     on Multaq  . Asthma   . Osteoporosis     DEXA 04/2011: -3.1 L fem, on Boniva  . Diverticulosis   . Seizures    Family History  Problem Relation Age of Onset  . Angina Mother   . Alzheimer's disease Mother   . Heart attack Father   . Heart failure Father   . Heart disease Father   . Heart disease Brother   . Breast cancer Maternal Aunt   . Cancer Maternal Aunt     breast  . Cervical cancer Maternal Aunt   . Cancer Maternal Aunt     ovarian  . Colon cancer Neg Hx      Review of Systems  Constitutional: Positive for fatigue. Negative for fever and chills.  Gastrointestinal: Positive for abdominal pain. Negative for nausea and vomiting.  Genitourinary: Positive for urgency, frequency and flank pain.  Negative for dysuria, hematuria and difficulty urinating.  All other systems reviewed and are negative.       Objective:   Physical Exam  Nursing note and vitals reviewed. Constitutional: She is oriented to person, place, and time. She appears well-developed and well-nourished. No distress.  HENT:  Head: Normocephalic and atraumatic.  Mouth/Throat: No oropharyngeal exudate.  Eyes: Conjunctivae are normal. No scleral icterus.  Cardiovascular: Normal rate, regular rhythm and normal heart sounds.  Exam reveals no gallop and no friction rub.   No murmur heard. Pulmonary/Chest: Effort normal and breath sounds normal. No respiratory distress. She has no wheezes. She has no rales. She exhibits no tenderness.  Abdominal: Soft. Normal appearance and bowel sounds are normal. There is tenderness in the suprapubic area. There is CVA tenderness.  Musculoskeletal: Normal range of motion.  Neurological: She is alert and oriented to person, place, and time.  Skin: Skin is warm and dry.  Psychiatric: She has a normal mood and affect. Her behavior is normal.    Wt Readings from Last 3 Encounters:  09/30/12 171 lb (77.565 kg)  09/12/12 167 lb (75.751 kg)  07/18/12 163 lb 12.8 oz (74.299 kg)    Filed Vitals:   09/30/12 1100  BP:  122/78  Pulse: 80  Temp: 97.5 F (36.4 C)  TempSrc: Oral  Weight: 171 lb (77.565 kg)  SpO2: 96%        Assessment & Plan:  Patient presents to the office for frequency, urgency, suprapubic pain, and LBP.  Urine Dipstick positive for leukocytes and nitrites.  Patient prescribed Keflex 500 mg PO BID x 5 days.  Patient given samples of Align as requested.      I have personally reviewed this case with PA student. I also personally examined this patient. I agree with history and findings as documented above. I reviewed, discussed and approve of the assessment and plan as listed above. Rene Paci, MD

## 2012-10-03 ENCOUNTER — Encounter: Payer: Self-pay | Admitting: Internal Medicine

## 2012-10-03 LAB — HM MAMMOGRAPHY

## 2012-10-10 ENCOUNTER — Other Ambulatory Visit: Payer: Self-pay | Admitting: *Deleted

## 2012-10-10 NOTE — Telephone Encounter (Signed)
Received fax pt wanting refill on phenobarbital 60 mg. Per chart med was change to phenobarbital 64.8 due to med being on back order...lmb

## 2012-10-15 ENCOUNTER — Other Ambulatory Visit: Payer: Self-pay | Admitting: Internal Medicine

## 2012-10-15 IMAGING — CR DG RIBS 2V*L*
3 series · 3 of 3 positions shown · non-contrast
Comparison: 06/17/2010

CLINICAL DATA: Left-sided rib pain

LEFT RIBS - 2 VIEW

[view not recorded (1 of 3)]
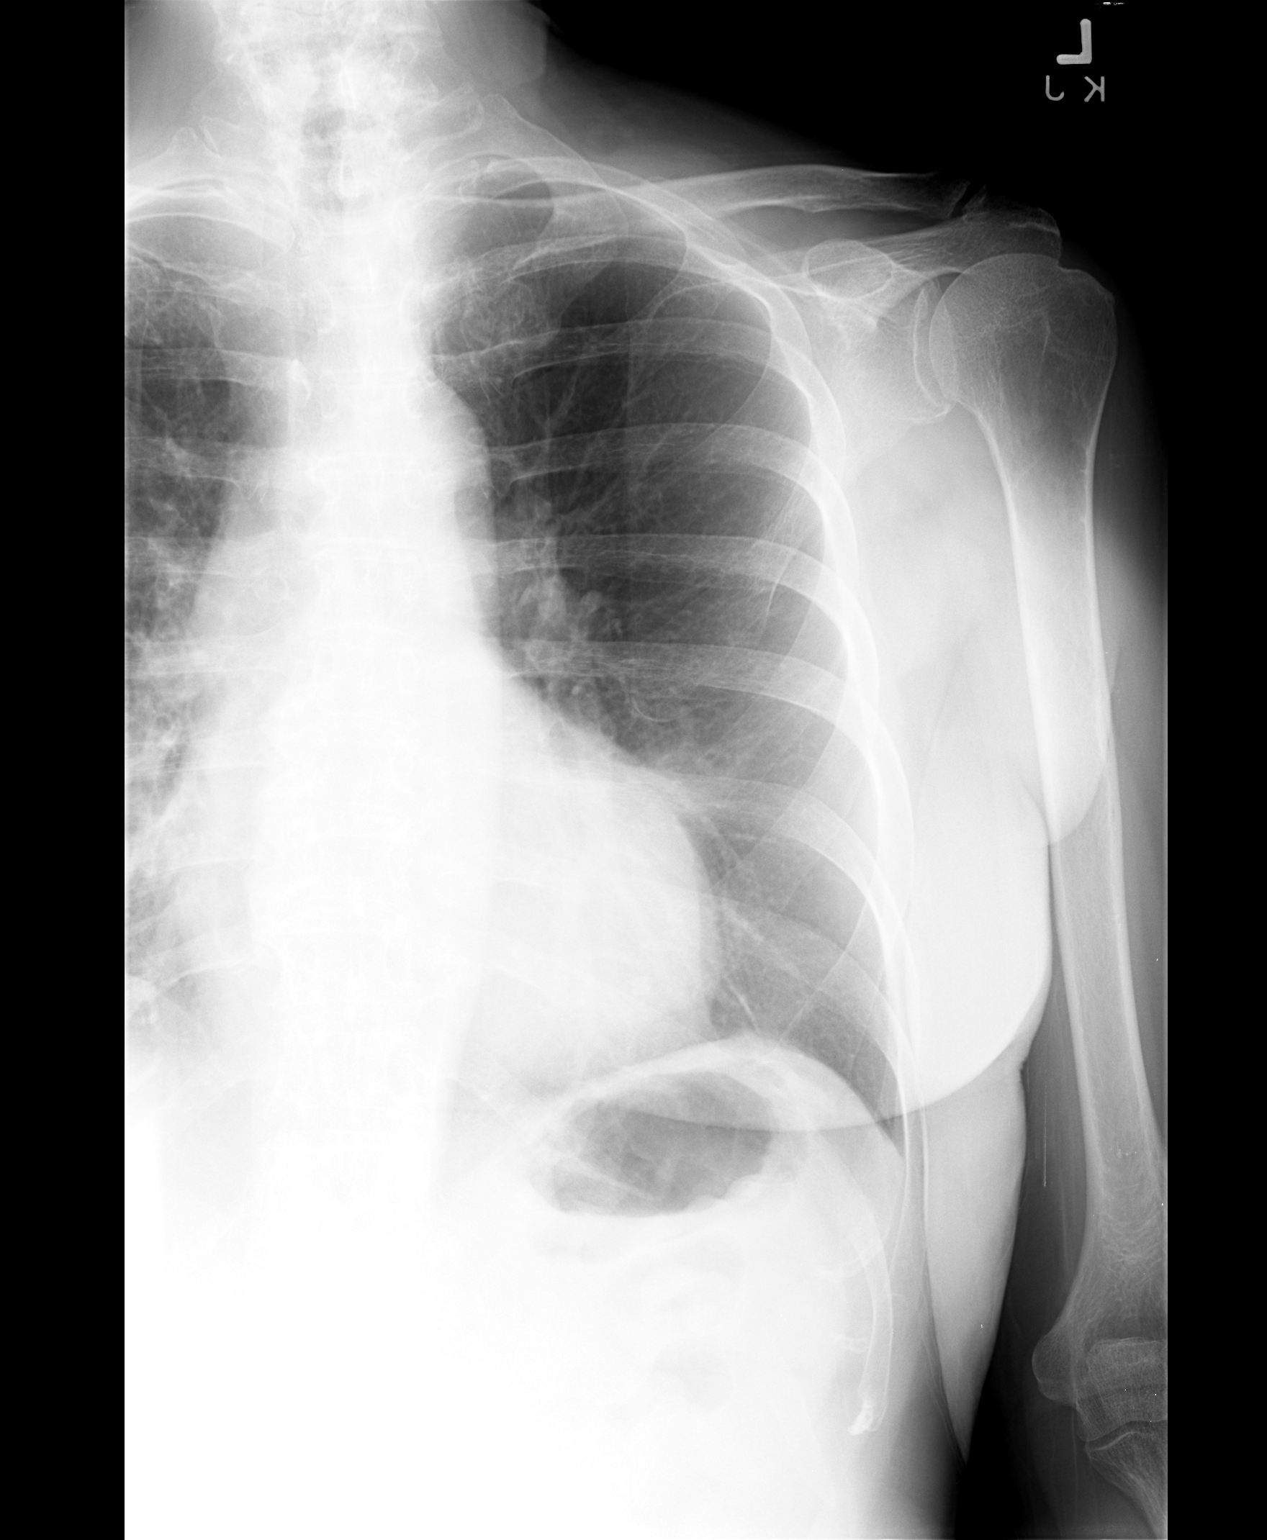

[view not recorded (2 of 3)]
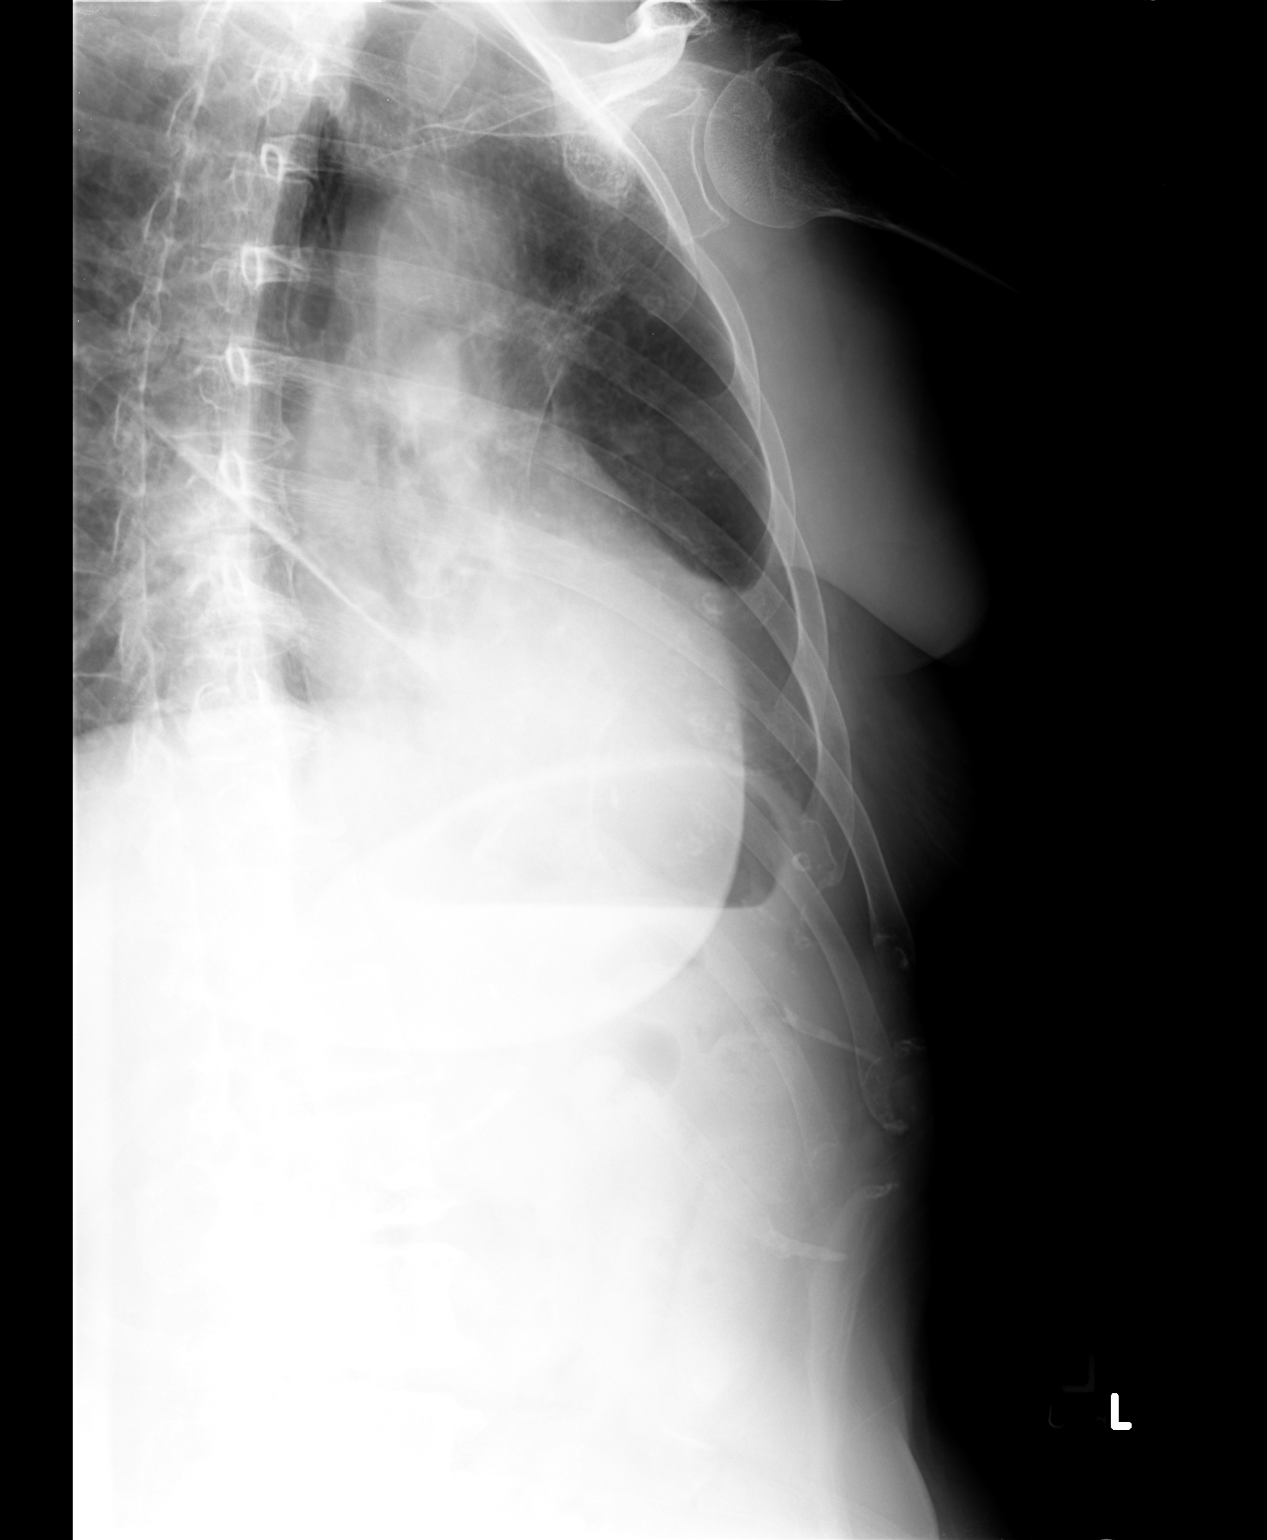

[view not recorded (3 of 3)]
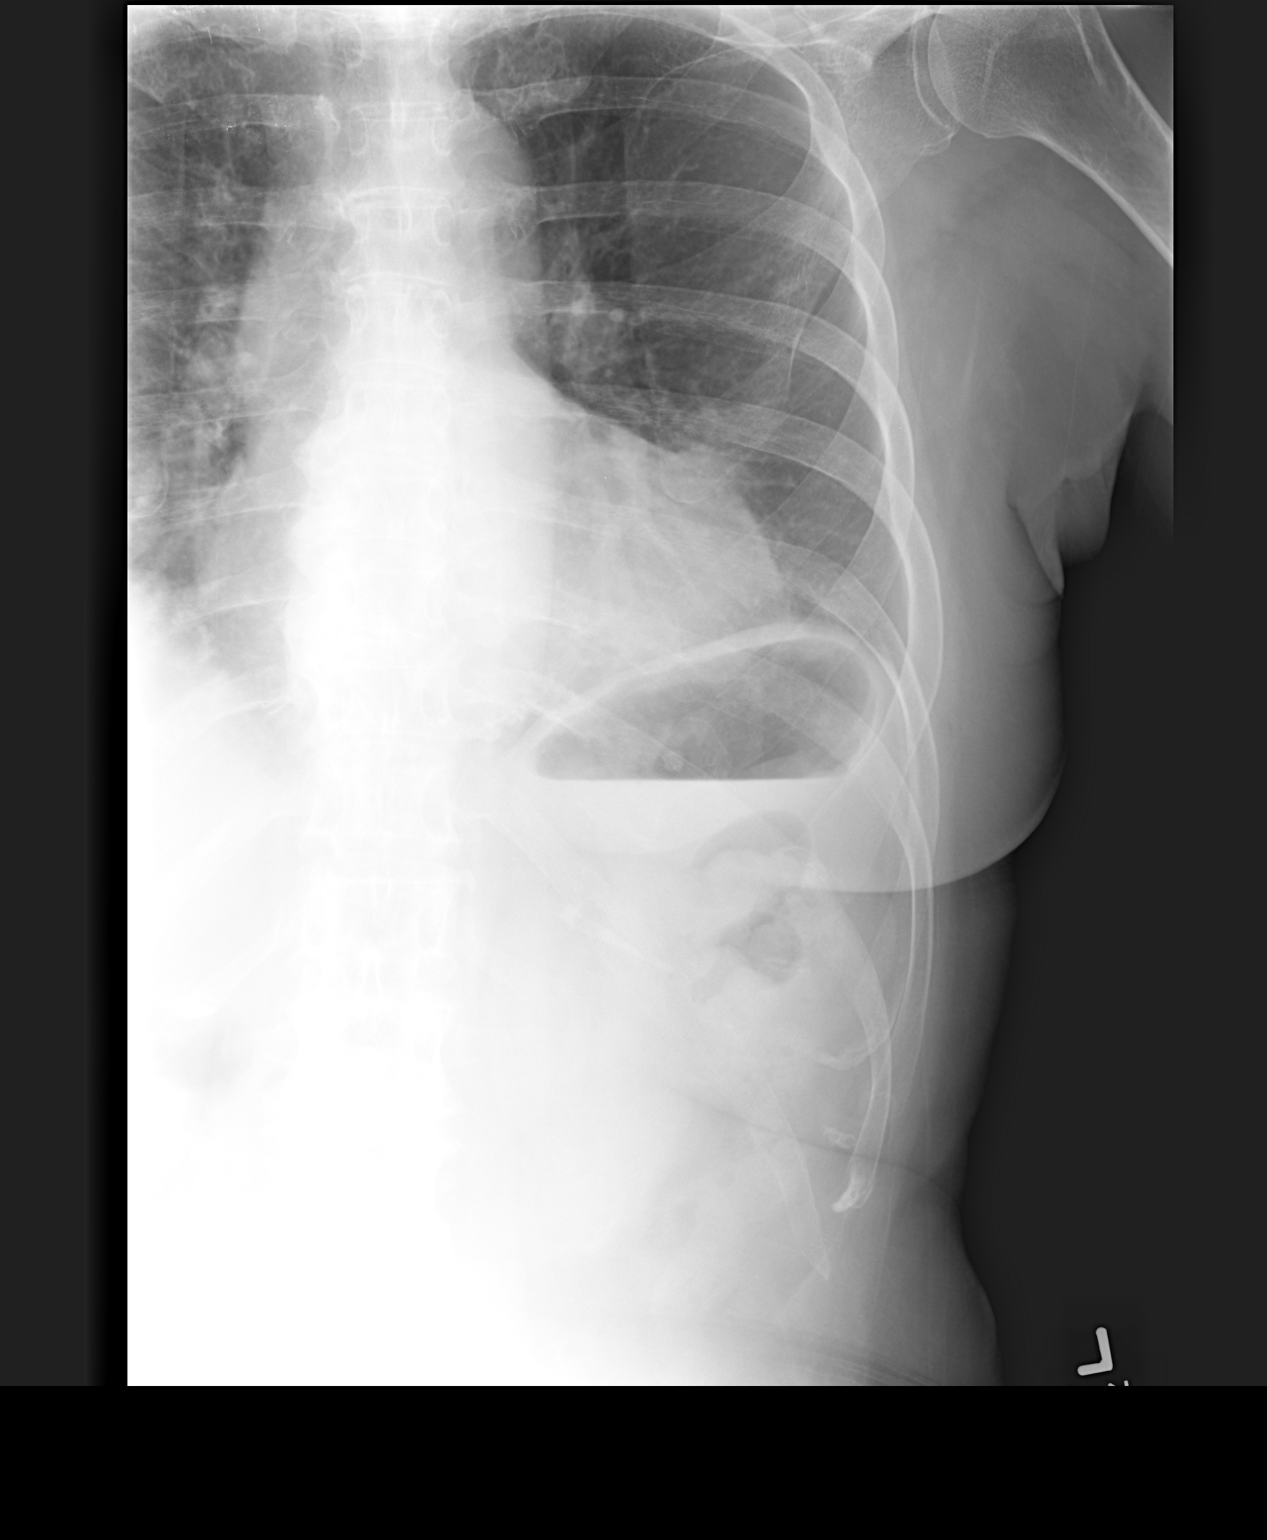

[3 of 3 positions shown; findings below may reference images not displayed]

FINDINGS: The cardiac shadow is within normal limits.  No acute
displaced or deforming rib fracture is noted.  No pneumothorax or
focal infiltrate is seen.
IMPRESSION: No acute abnormality noted.

## 2012-10-17 NOTE — Telephone Encounter (Signed)
Faxed script back to [facility].../lmb 

## 2012-10-19 ENCOUNTER — Ambulatory Visit (INDEPENDENT_AMBULATORY_CARE_PROVIDER_SITE_OTHER): Payer: Medicare Other | Admitting: Critical Care Medicine

## 2012-10-19 ENCOUNTER — Encounter: Payer: Self-pay | Admitting: Critical Care Medicine

## 2012-10-19 VITALS — BP 108/68 | HR 76 | Temp 97.3°F | Ht 63.0 in | Wt 169.6 lb

## 2012-10-19 DIAGNOSIS — J45909 Unspecified asthma, uncomplicated: Secondary | ICD-10-CM

## 2012-10-19 MED ORDER — FLUTICASONE PROPIONATE 50 MCG/ACT NA SUSP: NASAL | Status: DC

## 2012-10-19 NOTE — Progress Notes (Signed)
Subjective:    Patient ID: Misty Meyer, female    DOB: January 09, 1941, 72 y.o.   MRN: [id number]  HPI  72 y.o. female with known hx of asthmatic bronchitis, abn CXR with LLL scar, ? MAC ? TB in past.   10/19/2012 Since the last office visit this patient has done well. There has not been excess cough or wheezing. The patient is not requiring rescue inhaler. There is no nocturnal awakenings. His been no recent steroid usage is systemically. Pt denies any significant sore throat, nasal congestion or excess secretions, fever, chills, sweats, unintended weight loss, pleurtic or exertional chest pain, orthopnea PND, or leg swelling Pt denies any increase in rescue therapy over baseline, denies waking up needing it or having any early am or nocturnal exacerbations of coughing/wheezing/or dyspnea. Pt also denies any obvious fluctuation in symptoms with  weather or environmental change or other alleviating or aggravating factors    PUL ASTHMA HISTORY 10/19/2012 02/10/2012  Symptoms 0-2 days/week 0-2 days/week  Nighttime awakenings 0-2/month 0-2/month  Interference with activity Minor limitations No limitations  SABA use 0-2 days/wk 0-2 days/wk  Exacerbations requiring oral steroids 0-1 / year 0-1 / year    Past Medical History  Diagnosis Date  . CAD (coronary artery disease)     nonobst cath 12/2008  . HLD (hyperlipidemia)   . Calculus of gallbladder without mention of cholecystitis or obstruction 03/2011    s/p lap chole  . IBS (irritable bowel syndrome)   . GERD (gastroesophageal reflux disease)   . Tinnitus   . Seizure disorder     Grand mal seizure 7/12, [facility]  . Anemia   . Allergic rhinitis, cause unspecified   . Major depressive disorder, single episode, severe, without mention of psychotic behavior   . Arthritis of back   . Atrial fibrillation     on Multaq  . Asthma   . Osteoporosis     DEXA 04/2011: -3.1 L fem, on Boniva  . Diverticulosis   . Seizures      Family  History  Problem Relation Age of Onset  . Angina Mother   . Alzheimer's disease Mother   . Heart attack Father   . Heart failure Father   . Heart disease Father   . Heart disease Brother   . Breast cancer Maternal Aunt   . Cancer Maternal Aunt     breast  . Cervical cancer Maternal Aunt   . Cancer Maternal Aunt     ovarian  . Colon cancer Neg Hx      History   Social History  . Marital Status: Divorced    Spouse Name: N/A    Number of Children: 0  . 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     Social History Main Topics  . Smoking status: Never Smoker   . Smokeless tobacco: Never Used  . Alcohol Use: No     Comment: quit 2012  . Drug Use: No  . Sexually Active: No   Other Topics Concern  . Not on file   Social History Narrative  . No narrative on file     Allergies  Allergen Reactions  . Codeine Other (See Comments)    Caused possible hallucinations and anxiety  . Latex Rash  . Neomycin-Bacitracin Zn-Polymyx Itching and Rash  . Penicillins Swelling    Lips     Outpatient Prescriptions Prior to Visit  Medication  Sig Dispense Refill  . acetaminophen (TYLENOL) 500 MG tablet Take 500 mg by mouth every 6 (six) hours as needed.       Marland Kitchen albuterol (PROAIR HFA) 108 (90 BASE) MCG/ACT inhaler Inhale 2 puffs into the lungs every 4 (four) hours as needed for wheezing.  3 Inhaler  3  . aspirin 325 MG tablet Take 325 mg by mouth daily.        . beclomethasone (QVAR) 40 MCG/ACT inhaler Inhale 2 puffs into the lungs 2 (two) times daily.  8.7 g  11  . diclofenac (VOLTAREN) 75 MG EC tablet TAKE 1 TABLET BY MOUTH TWICE DAILY AS NEEDED  60 tablet  3  . dronedarone (MULTAQ) 400 MG tablet Take 1 tablet (400 mg total) by mouth 2 (two) times daily with a meal.  60 tablet  11  . ibandronate (BONIVA) 150 MG tablet Take 1 tablet (150 mg total) by mouth every 30 (thirty) days. Take in the morning with a full glass of water, on an  empty stomach, and do not take anything else by mouth or lie down for the next 30 min.  3 tablet  3  . levETIRAcetam (KEPPRA) 500 MG tablet Take 1 tablet (500 mg total) by mouth 2 (two) times daily.  60 tablet  11  . loperamide (IMODIUM A-D) 2 MG tablet Take 2 mg by mouth 3 (three) times daily as needed. For diarrhea      . metoprolol succinate (TOPROL-XL) 50 MG 24 hr tablet TAKE 1 TABLET BY MOUTH EVERY DAY  90 tablet  3  . NITROSTAT 0.4 MG SL tablet DISSOLVE 1 TABLET UNDER THE TONGUE EVERY 5 MINUTES AS NEEDED FOR CHEST PAIN, MAY REPEAT THREE TIMES  25 tablet  3  . PHENobarbital (LUMINAL) 64.8 MG tablet TAKE 1 TABLET BY MOUTH TWICE DAILY  180 tablet  0  . potassium chloride (K-DUR,KLOR-CON) 10 MEQ tablet Take 1 tablet (10 mEq total) by mouth daily.  90 tablet  3  . Probiotic Product (ALIGN PO) Take 1 tablet by mouth daily.      . rosuvastatin (CRESTOR) 20 MG tablet Take 1 tablet (20 mg total) by mouth daily.  90 tablet  3  . sodium chloride (OCEAN) 0.65 % nasal spray 3 sprays by Nasal route 3 (three) times daily.       . fluticasone (FLONASE) 50 MCG/ACT nasal spray PLACE 2 SPRAYS INTO THE NOSE DAILY AS DIRECTED  48 g  3  . ranitidine (ZANTAC) 150 MG tablet TAKE 1 TABLET BY MOUTH TWICE DAILY  180 tablet  3  . calcium carbonate (OS-CAL - DOSED IN MG OF ELEMENTAL CALCIUM) 1250 MG tablet On hold      . Multiple Vitamin (MULTIVITAMIN WITH MINERALS) TABS On hold      . solifenacin (VESICARE) 5 MG tablet Take 1 tablet (5 mg total) by mouth daily.  90 tablet  3  . vitamin C (ASCORBIC ACID) 500 MG tablet On hold      . cephALEXin (KEFLEX) 500 MG capsule Take 1 capsule (500 mg total) by mouth 3 (three) times daily.  21 capsule  1   No facility-administered medications prior to visit.      Review of Systems  Constitutional:   No  weight loss, night sweats,  Fevers, chills, fatigue, lassitude. HEENT:   No headaches,  Difficulty swallowing,  Tooth/dental problems,  Sore throat,                No  sneezing,  itching, ear ache, nasal congestion, post nasal drip,   CV:  No chest pain,  Orthopnea, PND, swelling in lower extremities, anasarca, dizziness, palpitations  GI  No heartburn, indigestion, abdominal pain, nausea, vomiting, diarrhea, change in bowel habits, loss of appetite  Resp: Notes  shortness of breath with exertion not  at rest.  No excess mucus, no productive cough,  Notes  non-productive cough,  No coughing up of blood.  No change in color of mucus.  No wheezing.  No chest wall deformity  Skin: no rash or lesions.  GU: no dysuria, change in color of urine, no urgency or frequency.  No flank pain.  MS:  No joint pain or swelling.  No decreased range of motion.  No back pain.  Psych:  No change in mood or affect. No depression or anxiety.  No memory loss.     Objective:   Physical Exam BP 108/68  Pulse 76  Temp(Src) 97.3 F (36.3 C) (Oral)  Ht 5\' 3"  (1.6 m)  Wt 169 lb 9.6 oz (76.93 kg)  BMI 30.05 kg/m2  SpO2 97%  Gen: Pleasant, well-nourished, in no distress,  normal affect  ENT: No lesions,  mouth clear,  oropharynx clear, no postnasal drip  Neck: No JVD, no TMG, no carotid bruits  Lungs: No use of accessory muscles, no dullness to percussion, clear  Cardiovascular: RRR, heart sounds normal, no murmur or gallops, no peripheral edema  Abdomen: soft and NT, no HSM,  BS normal  Musculoskeletal: No deformities, no cyanosis or clubbing  Neuro: alert, non focal  Skin: Warm, no lesions or rashes      PFT Conversion 08/22/2009  FVC 1.98  FVC  % Predicted 64.4  FEV1 1.62  FEV1 PREDICT 2.34  FEV % Predicted 69.2  FEV1/FVC 81.8  FEV1/FVC%EXP 106.80  PEF % EXPECT 5.83    Assessment & Plan:   Moderate persistent asthma without complication Moderate persistent asthma stable at this time Plan Maintain inhaled medications as prescribed    Updated Medication List Outpatient Encounter Prescriptions as of 10/19/2012  Medication Sig Dispense Refill  .  acetaminophen (TYLENOL) 500 MG tablet Take 500 mg by mouth every 6 (six) hours as needed.       Marland Kitchen albuterol (PROAIR HFA) 108 (90 BASE) MCG/ACT inhaler Inhale 2 puffs into the lungs every 4 (four) hours as needed for wheezing.  3 Inhaler  3  . aspirin 325 MG tablet Take 325 mg by mouth daily.        . beclomethasone (QVAR) 40 MCG/ACT inhaler Inhale 2 puffs into the lungs 2 (two) times daily.  8.7 g  11  . diclofenac (VOLTAREN) 75 MG EC tablet TAKE 1 TABLET BY MOUTH TWICE DAILY AS NEEDED  60 tablet  3  . dronedarone (MULTAQ) 400 MG tablet Take 1 tablet (400 mg total) by mouth 2 (two) times daily with a meal.  60 tablet  11  . fluticasone (FLONASE) 50 MCG/ACT nasal spray PLACE 2 SPRAYS INTO THE NOSE DAILY AS DIRECTED  16 g  5  . ibandronate (BONIVA) 150 MG tablet Take 1 tablet (150 mg total) by mouth every 30 (thirty) days. Take in the morning with a full glass of water, on an empty stomach, and do not take anything else by mouth or lie down for the next 30 min.  3 tablet  3  . levETIRAcetam (KEPPRA) 500 MG tablet Take 1 tablet (500 mg total) by mouth 2 (two) times daily.  60 tablet  11  . loperamide (IMODIUM A-D) 2 MG tablet Take 2 mg by mouth 3 (three) times daily as needed. For diarrhea      . metoprolol succinate (TOPROL-XL) 50 MG 24 hr tablet TAKE 1 TABLET BY MOUTH EVERY DAY  90 tablet  3  . NITROSTAT 0.4 MG SL tablet DISSOLVE 1 TABLET UNDER THE TONGUE EVERY 5 MINUTES AS NEEDED FOR CHEST PAIN, MAY REPEAT THREE TIMES  25 tablet  3  . PHENobarbital (LUMINAL) 64.8 MG tablet TAKE 1 TABLET BY MOUTH TWICE DAILY  180 tablet  0  . potassium chloride (K-DUR,KLOR-CON) 10 MEQ tablet Take 1 tablet (10 mEq total) by mouth daily.  90 tablet  3  . Probiotic Product (ALIGN PO) Take 1 tablet by mouth daily.      . ranitidine (ZANTAC) 150 MG tablet TAKE 1 TABLET BY MOUTH once DAILY      . rosuvastatin (CRESTOR) 20 MG tablet Take 1 tablet (20 mg total) by mouth daily.  90 tablet  3  . sodium chloride (OCEAN) 0.65 %  nasal spray 3 sprays by Nasal route 3 (three) times daily.       . [DISCONTINUED] fluticasone (FLONASE) 50 MCG/ACT nasal spray PLACE 2 SPRAYS INTO THE NOSE DAILY AS DIRECTED  48 g  3  . [DISCONTINUED] ranitidine (ZANTAC) 150 MG tablet TAKE 1 TABLET BY MOUTH TWICE DAILY  180 tablet  3  . calcium carbonate (OS-CAL - DOSED IN MG OF ELEMENTAL CALCIUM) 1250 MG tablet On hold      . Multiple Vitamin (MULTIVITAMIN WITH MINERALS) TABS On hold      . solifenacin (VESICARE) 5 MG tablet Take 1 tablet (5 mg total) by mouth daily.  90 tablet  3  . vitamin C (ASCORBIC ACID) 500 MG tablet On hold      . [DISCONTINUED] cephALEXin (KEFLEX) 500 MG capsule Take 1 capsule (500 mg total) by mouth 3 (three) times daily.  21 capsule  1   No facility-administered encounter medications on file as of 10/19/2012.

## 2012-10-19 NOTE — Patient Instructions (Addendum)
No change in medications. Return in        6 months        

## 2012-10-20 NOTE — Assessment & Plan Note (Signed)
Moderate persistent asthma stable at this time Plan Maintain inhaled medications as prescribed 

## 2012-10-25 ENCOUNTER — Ambulatory Visit (INDEPENDENT_AMBULATORY_CARE_PROVIDER_SITE_OTHER): Payer: Medicare Other | Admitting: Internal Medicine

## 2012-10-25 ENCOUNTER — Encounter: Payer: Self-pay | Admitting: Cardiology

## 2012-10-25 ENCOUNTER — Encounter: Payer: Self-pay | Admitting: Internal Medicine

## 2012-10-25 VITALS — BP 112/68 | HR 79 | Temp 97.6°F | Wt 170.2 lb

## 2012-10-25 DIAGNOSIS — R197 Diarrhea, unspecified: Secondary | ICD-10-CM

## 2012-10-25 DIAGNOSIS — R609 Edema, unspecified: Secondary | ICD-10-CM

## 2012-10-25 MED ORDER — HYDROCHLOROTHIAZIDE 25 MG PO TABS: 25.0000 mg | ORAL_TABLET | Freq: Every day | ORAL | Status: DC

## 2012-10-25 NOTE — Patient Instructions (Signed)
It was good to see you today. We have reviewed your prior records including labs and tests today Use HCTZ for fluid as discussed - once daily for 5 days, then as needed Your prescription(s) have been submitted to your pharmacy. Please take as directed and contact our office if you believe you are having problem(s) with the medication(s). Reduce Zantac to once daily and hold off on Align for now  Diarrhea Diarrhea is frequent loose and watery bowel movements. It can cause you to feel weak and dehydrated. Dehydration can cause you to become tired and thirsty, have a dry mouth, and have decreased urination that often is dark yellow. Diarrhea is a sign of another problem, most often an infection that will not last long. In most cases, diarrhea typically lasts 2 3 days. However, it can last longer if it is a sign of something more serious. It is important to treat your diarrhea as directed by your caregive to lessen or prevent future episodes of diarrhea. CAUSES  Some common causes include:  Gastrointestinal infections caused by viruses, bacteria, or parasites.  Food poisoning or food allergies.  Certain medicines, such as antibiotics, chemotherapy, and laxatives.  Artificial sweeteners and fructose.  Digestive disorders. HOME CARE INSTRUCTIONS  Ensure adequate fluid intake (hydration): have 1 cup (8 oz) of fluid for each diarrhea episode. Avoid fluids that contain simple sugars or sports drinks, fruit juices, whole milk products, and sodas. Your urine should be clear or pale yellow if you are drinking enough fluids. Hydrate with an oral rehydration solution that you can purchase at pharmacies, retail stores, and online. You can prepare an oral rehydration solution at home by mixing the following ingredients together:    tsp table salt.   tsp baking soda.   tsp salt substitute containing potassium chloride.  1  tablespoons sugar.  1 L (34 oz) of water.  Certain foods and beverages may  increase the speed at which food moves through the gastrointestinal (GI) tract. These foods and beverages should be avoided and include:  Caffeinated and alcoholic beverages.  High-fiber foods, such as raw fruits and vegetables, nuts, seeds, and whole grain breads and cereals.  Foods and beverages sweetened with sugar alcohols, such as xylitol, sorbitol, and mannitol.  Some foods may be well tolerated and may help thicken stool including:  Starchy foods, such as rice, toast, pasta, low-sugar cereal, oatmeal, grits, baked potatoes, crackers, and bagels.  Bananas.  Applesauce.  Add probiotic-rich foods to help increase healthy bacteria in the GI tract, such as yogurt and fermented milk products.  Wash your hands well after each diarrhea episode.  Only take over-the-counter or prescription medicines as directed by your caregiver.  Take a warm bath to relieve any burning or pain from frequent diarrhea episodes. SEEK IMMEDIATE MEDICAL CARE IF:   You are unable to keep fluids down.  You have persistent vomiting.  You have blood in your stool, or your stools are black and tarry.  You do not urinate in 6 8 hours, or there is only a small amount of very dark urine.  You have abdominal pain that increases or localizes.  You have weakness, dizziness, confusion, or lightheadedness.  You have a severe headache.  Your diarrhea gets worse or does not get better.  You have a fever or persistent symptoms for more than 2 3 days.  You have a fever and your symptoms suddenly get worse. MAKE SURE YOU:   Understand these instructions.  Will watch your condition.  Will get help right away if you are not doing well or get worse. Document Released: 05/08/2002 Document Revised: 05/04/2012 Document Reviewed: 01/24/2012 [facility] [employer] Patient Information 2014 [place], [place].  Edema Edema is a buildup of fluids. It is most common in the feet, ankles, and legs. This happens more as a person ages.  It may affect one or both legs. HOME CARE   Raise (elevate) the legs or ankles above the level of the heart while lying down.  Avoid sitting or standing still for a long time.  Exercise the legs to help the puffiness (swelling) go down.  A low-salt diet may help lessen the puffiness.  Only take medicine as told by your doctor. GET HELP RIGHT AWAY IF:   You develop shortness of breath or chest pain.  You cannot breathe when you lie down.  You have more puffiness that does not go away with treatment.  You develop pain or redness in the areas that are puffy.  You have a temperature by mouth above 102 F (38.9 C), not controlled by medicine.  You gain 3 lb/1.4 kg or more in 1 day or 5 lb/2.3 kg in a week. MAKE SURE YOU:   Understand these instructions.  Will watch your condition.  Will get help right away if you are not doing well or get worse. Document Released: 11/04/2007 Document Revised: 08/10/2011 Document Reviewed: 11/04/2007 [facility] Patient Information 2014 [place], [place].

## 2012-10-25 NOTE — Progress Notes (Signed)
Subjective:    Patient ID: Misty Meyer, female    DOB: [birth year], 72 y.o.   MRN: [id number]  Diarrhea  This is a new problem. The current episode started in the past 7 days. The problem occurs less than 2 times per day. The problem has been gradually improving. The stool consistency is described as watery. The patient states that diarrhea does not awaken her from sleep. Pertinent negatives include no abdominal pain, coughing, fever, increased  flatus, vomiting or weight loss. The symptoms are aggravated by stress. There are no known risk factors. She has tried anti-motility drug for the symptoms. The treatment provided significant relief. Her past medical history is significant for irritable bowel syndrome. There is no history of bowel resection, malabsorption, a recent abdominal surgery or short gut syndrome.     Past Medical History  Diagnosis Date  . CAD (coronary artery disease)     nonobst cath 12/2008  . HLD (hyperlipidemia)   . Calculus of gallbladder without mention of cholecystitis or obstruction 03/2011    s/p lap chole  . IBS (irritable bowel syndrome)   . GERD (gastroesophageal reflux disease)   . Tinnitus   . Seizure disorder     Grand mal seizure 7/12, [facility]  . Anemia   . Allergic rhinitis, cause unspecified   . Major depressive disorder, single episode, severe, without mention of psychotic behavior   . Arthritis of back   . Atrial fibrillation     on Multaq  . Asthma   . Osteoporosis     DEXA 04/2011: -3.1 L fem, on Boniva  . Diverticulosis   . Seizures     Review of Systems  Constitutional: Positive for fatigue. Negative for fever, weight loss and unexpected weight change.  Respiratory: Negative for cough, chest tightness, shortness of breath and wheezing.   Cardiovascular: Positive for leg swelling.  Gastrointestinal: Positive for diarrhea. Negative for nausea, vomiting, abdominal pain, rectal pain and flatus.       Objective:   Physical  Exam  BP 112/68  Pulse 79  Temp(Src) 97.6 F (36.4 C) (Oral)  Wt 170 lb 3.2 oz (77.202 kg)  BMI 30.16 kg/m2  SpO2 97% Wt Readings from Last 3 Encounters:  10/25/12 170 lb 3.2 oz (77.202 kg)  10/19/12 169 lb 9.6 oz (76.93 kg)  09/30/12 171 lb (77.565 kg)   Constitutional: She appears well-developed and well-nourished. No distress.  Neck: Normal range of motion. Neck supple. No JVD present. No thyromegaly present.  Cardiovascular: normal rate, irregular rhythm, normal sounds.  No murmur heard. trace BLE edema. Pulmonary/Chest: Effort normal, breath sounds with rhonchi and congested cough. No respiratory distress. She has no wheezes.  Abdominal: Soft. Bowel sounds are normal. She exhibits no distension. There is no tenderness. no masses  Lab Results  Component Value Date   WBC 8.5 04/29/2012   HGB 11.6* 04/29/2012   HCT 34.0* 04/29/2012   PLT 187 04/29/2012   GLUCOSE 83 07/04/2012   CHOL 138 06/08/2011   TRIG 69.0 06/08/2011   HDL 55.60 06/08/2011   LDLDIRECT 137.0 10/23/2008   LDLCALC 69 06/08/2011   ALT 29 12/28/2011   AST 23 12/28/2011   NA 140 07/04/2012   K 3.8 07/04/2012   CL 106 07/04/2012   CREATININE 1.0 07/04/2012   BUN 23 07/04/2012   CO2 29 07/04/2012   TSH 0.37 06/23/2011   INR 1.0 01/03/2009   HGBA1C 5.7 02/09/2012       Assessment & Plan:  See problem list. Medications and labs reviewed today.  Mild peripheral edema - hx same, prev used prn HCTZ for same, stopped by cards 2012 due to dehydration - no other medication changes and denies diet changes - euvolemic on exam and HD stable - ok for prn HCTZ, not daily =also elevate and reduce Na - education provided at length  Diarrhea, suspect IBS flare with upcoming stress and travel (family graduation and wedding) - improved with OTC immodium - no red flags on hx/exam - advised to hold off on probiotic, reduce H2B to qd and call if worse or unimproved.

## 2012-10-26 ENCOUNTER — Telehealth: Payer: Self-pay | Admitting: Internal Medicine

## 2012-10-26 NOTE — Telephone Encounter (Signed)
Yes ok to take both

## 2012-10-26 NOTE — Telephone Encounter (Signed)
Pt takes 2 phenobarb tablets per day.  She was prescribed Hydrochlorothiazide 25.  She wants to know if both can be taken.

## 2012-10-26 NOTE — Telephone Encounter (Signed)
Pt has been informed.

## 2012-12-13 ENCOUNTER — Ambulatory Visit (INDEPENDENT_AMBULATORY_CARE_PROVIDER_SITE_OTHER): Payer: Medicare Other | Admitting: Internal Medicine

## 2012-12-13 ENCOUNTER — Encounter: Payer: Self-pay | Admitting: Internal Medicine

## 2012-12-13 VITALS — BP 130/82 | HR 88 | Temp 97.8°F | Wt 164.0 lb

## 2012-12-13 DIAGNOSIS — M76899 Other specified enthesopathies of unspecified lower limb, excluding foot: Secondary | ICD-10-CM

## 2012-12-13 DIAGNOSIS — M7062 Trochanteric bursitis, left hip: Secondary | ICD-10-CM

## 2012-12-13 MED ORDER — IBANDRONATE SODIUM 150 MG PO TABS: 150.0000 mg | ORAL_TABLET | ORAL | Status: AC

## 2012-12-13 NOTE — Patient Instructions (Signed)

## 2012-12-13 NOTE — Progress Notes (Signed)
Subjective:    Patient ID: Misty Meyer, female    DOB: [birth year], 72 y.o.   MRN: [id number]  HPI  See CC complains of L lateral hip pain Pain worse with standing, activity and lying directly on left side in bed Overuse with walking on vacation 2 weeks ago, but no fall, weakness or injury No groin pain No hx same  Past Medical History  Diagnosis Date  . CAD (coronary artery disease)     nonobst cath 12/2008  . HLD (hyperlipidemia)   . Calculus of gallbladder without mention of cholecystitis or obstruction 03/2011    s/p lap chole  . IBS (irritable bowel syndrome)   . GERD (gastroesophageal reflux disease)   . Tinnitus   . Seizure disorder     Grand mal seizure 7/12, [facility]  . Anemia   . Allergic rhinitis, cause unspecified   . Major depressive disorder, single episode, severe, without mention of psychotic behavior   . Arthritis of back   . Atrial fibrillation     on Multaq  . Asthma   . Osteoporosis     DEXA 04/2011: -3.1 L fem, on Boniva  . Diverticulosis   . Seizures     Review of Systems  Respiratory: Negative for cough and shortness of breath.   Musculoskeletal: Negative for back pain and joint swelling.       Objective:   Physical Exam BP 130/82  Pulse 88  Temp(Src) 97.8 F (36.6 C) (Oral)  Wt 164 lb (74.39 kg)  BMI 29.06 kg/m2  SpO2 96% Wt Readings from Last 3 Encounters:  12/13/12 164 lb (74.39 kg)  10/25/12 170 lb 3.2 oz (77.202 kg)  10/19/12 169 lb 9.6 oz (76.93 kg)   Constitutional: She appears well-developed and well-nourished. No distress.  Neck: Normal range of motion. Neck supple. No JVD present. No thyromegaly present.  Cardiovascular: Normal rate, regular rhythm and normal heart sounds.  No murmur heard. No BLE edema. Pulmonary/Chest: Effort normal and breath sounds normal. No respiratory distress. She has no wheezes.  Musculoskeletal: L hip with normal range of motion, nontender over groin. Tender laterally to palpation over  greater trochanteric bursa. Painful passive internal rotation/abduction at lateral hip. Skin: Skin is warm and dry. No rash noted. No erythema.  Psychiatric: She has a normal mood and affect. Her behavior is normal. Judgment and thought content normal.   Lab Results  Component Value Date   WBC 8.5 04/29/2012   HGB 11.6* 04/29/2012   HCT 34.0* 04/29/2012   PLT 187 04/29/2012   GLUCOSE 83 07/04/2012   CHOL 138 06/08/2011   TRIG 69.0 06/08/2011   HDL 55.60 06/08/2011   LDLDIRECT 137.0 10/23/2008   LDLCALC 69 06/08/2011   ALT 29 12/28/2011   AST 23 12/28/2011   NA 140 07/04/2012   K 3.8 07/04/2012   CL 106 07/04/2012   CREATININE 1.0 07/04/2012   BUN 23 07/04/2012   CO2 29 07/04/2012   TSH 0.37 06/23/2011   INR 1.0 01/03/2009   HGBA1C 5.7 02/09/2012    Procedure Note:  Greater trochanteric bursa injection, left the patient elects to proceed after verbal consent is obtained. the patient informed of possible risks and complications prior to procedure. Using sterile technique throughout, patient is injected with 1:3 DepoMedrol (40 mg): 1% lidocaine into trochanteric bursa at site of maximal tenderness. the patient tolerated the procedure well. Ice 24-48h, heat thereafter as needed instructions aftercare provided.      Assessment & Plan:  Left lateral hip pain exam and history consistent with greater trochanteric bursitis.  Injection as above today. Home PT exercises and education provided

## 2012-12-16 ENCOUNTER — Ambulatory Visit (INDEPENDENT_AMBULATORY_CARE_PROVIDER_SITE_OTHER): Payer: Medicare Other | Admitting: Internal Medicine

## 2012-12-16 ENCOUNTER — Other Ambulatory Visit: Payer: Medicare Other

## 2012-12-16 ENCOUNTER — Encounter: Payer: Self-pay | Admitting: Internal Medicine

## 2012-12-16 VITALS — BP 112/70 | HR 96 | Temp 98.1°F | Wt 163.0 lb

## 2012-12-16 DIAGNOSIS — M5416 Radiculopathy, lumbar region: Secondary | ICD-10-CM

## 2012-12-16 DIAGNOSIS — M79609 Pain in unspecified limb: Secondary | ICD-10-CM

## 2012-12-16 DIAGNOSIS — M79605 Pain in left leg: Secondary | ICD-10-CM

## 2012-12-16 DIAGNOSIS — IMO0002 Reserved for concepts with insufficient information to code with codable children: Secondary | ICD-10-CM

## 2012-12-16 DIAGNOSIS — R300 Dysuria: Secondary | ICD-10-CM

## 2012-12-16 MED ORDER — TIZANIDINE HCL 4 MG PO TABS: 4.0000 mg | ORAL_TABLET | Freq: Four times a day (QID) | ORAL | Status: DC | PRN

## 2012-12-16 MED ORDER — TRAMADOL HCL 50 MG PO TABS: 50.0000 mg | ORAL_TABLET | Freq: Three times a day (TID) | ORAL | Status: DC | PRN

## 2012-12-16 NOTE — Patient Instructions (Signed)
It was good to see you today. Test(s) ordered today. Your results will be released to MyChart (or called to you) after review, usually within 72hours after test completion. If any changes need to be made, you will be notified at that same time. Medications reviewed and updated, Take Tylenol 3 times daily to help with arthritis and back pain, use prescription tramadol every 8 hours as needed for severe pain Continue diclofenac twice daily with food for arthritis pain symptoms Use muscle relaxer zanaflex at bedtime and as needed for spasm/pain Your prescription(s) have been submitted to your pharmacy. Please take as directed and contact our office if you believe you are having problem(s) with the medication(s). Call if pain symptoms unimproved in next 2 weeks, sooner if worse  Back Exercises Back exercises help treat and prevent back injuries. The goal is to increase your strength in your belly (abdominal) and back muscles. These exercises can also help with flexibility. Start these exercises when told by your doctor. HOME CARE Back exercises include: Pelvic Tilt.  Lie on your back with your knees bent. Tilt your pelvis until the lower part of your back is against the floor. Hold this position 5 to 10 sec. Repeat this exercise 5 to 10 times. Knee to Chest.  Pull 1 knee up against your chest and hold for 20 to 30 seconds. Repeat this with the other knee. This may be done with the other leg straight or bent, whichever feels better. Then, pull both knees up against your chest. Sit-Ups or Curl-Ups.  Bend your knees 90 degrees. Start with tilting your pelvis, and do a partial, slow sit-up. Only lift your upper half 30 to 45 degrees off the floor. Take at least 2 to 3 seonds for each sit-up. Do not do sit-ups with your knees out straight. If partial sit-ups are difficult, simply do the above but with only tightening your belly (abdominal) muscles and holding it as told. Hip-Lift.  Lie on your back  with your knees flexed 90 degrees. Push down with your feet and shoulders as you raise your hips 2 inches off the floor. Hold for 10 seconds, repeat 5 to 10 times. Back Arches.  Lie on your stomach. Prop yourself up on bent elbows. Slowly press on your hands, causing an arch in your low back. Repeat 3 to 5 times. Shoulder-Lifts.  Lie face down with arms beside your body. Keep hips and belly pressed to floor as you slowly lift your head and shoulders off the floor. Do not overdo your exercises. Be careful in the beginning. Exercises may cause you some mild back discomfort. If the pain lasts for more than 15 minutes, stop the exercises until you see your doctor. Improvement with exercise for back problems is slow.  Document Released: 06/20/2010 Document Revised: 08/10/2011 Document Reviewed: 03/19/2011 [facility] Patient Information 2014 [place], [place].

## 2012-12-16 NOTE — Progress Notes (Signed)
Subjective:    Patient ID: Misty Meyer, female    DOB: [birth year], 72 y.o.   MRN: [id number]  HPI  See CC -  Left leg pain initially much improved for 48h following troch bursa injection - then recurrent pain in L LBP and lateral side of left calf Denies fall, weakness or injury No rash Pain symptoms worse at night Also associated with supra-pubic discomfort and cramping, mild bowel change last 2 bowel movements "green" -but no nausea, vomiting or fever. Denies medication changes, recent new changes or other known sick exposure  Past Medical History  Diagnosis Date  . CAD (coronary artery disease)     nonobst cath 12/2008  . HLD (hyperlipidemia)   . Calculus of gallbladder without mention of cholecystitis or obstruction 03/2011    s/p lap chole  . IBS (irritable bowel syndrome)   . GERD (gastroesophageal reflux disease)   . Tinnitus   . Seizure disorder     Grand mal seizure 7/12, [facility]  . Anemia   . Allergic rhinitis, cause unspecified   . Major depressive disorder, single episode, severe, without mention of psychotic behavior   . Arthritis of back   . Atrial fibrillation     on Multaq  . Asthma   . Osteoporosis     DEXA 04/2011: -3.1 L fem, on Boniva  . Diverticulosis   . Seizures    Review of Systems  Constitutional: Negative for fever and fatigue.  Respiratory: Negative for cough and shortness of breath.   Cardiovascular: Negative for chest pain and palpitations.       Objective:   Physical Exam BP 112/70  Pulse 96  Temp(Src) 98.1 F (36.7 C) (Oral)  Wt 163 lb (73.936 kg)  BMI 28.88 kg/m2  SpO2 97% Wt Readings from Last 3 Encounters:  12/16/12 163 lb (73.936 kg)  12/13/12 164 lb (74.39 kg)  10/25/12 170 lb 3.2 oz (77.202 kg)   Constitutional: She appears well-developed and well-nourished. No distress.  Neck: Normal range of motion. Neck supple. No JVD present. No thyromegaly present.  Cardiovascular: Normal rate, regular rhythm and normal  heart sounds.  No murmur heard. No BLE edema. Pulmonary/Chest: Effort normal and breath sounds normal. No respiratory distress. She has no wheezes.  Abdominal: Soft. Bowel sounds are normal. She exhibits no distension. There is no tenderness. no masses Musculoskeletal: Back: full range of motion of thoracic and lumbar spine. Non tender to palpation. Negative straight leg raise. DTR's are symmetrically intact. Sensation intact in all dermatomes of the lower extremities. Full strength to manual muscle testing. patient is able to heel toe walk without difficulty and ambulates with antalgic gait. Skin: Skin is warm and dry. No rash noted. No erythema.  Psychiatric: She has a normal mood and affect. Her behavior is normal. Judgment and thought content normal.   Lab Results  Component Value Date   WBC 8.5 04/29/2012   HGB 11.6* 04/29/2012   HCT 34.0* 04/29/2012   PLT 187 04/29/2012   GLUCOSE 83 07/04/2012   CHOL 138 06/08/2011   TRIG 69.0 06/08/2011   HDL 55.60 06/08/2011   LDLDIRECT 137.0 10/23/2008   LDLCALC 69 06/08/2011   ALT 29 12/28/2011   AST 23 12/28/2011   NA 140 07/04/2012   K 3.8 07/04/2012   CL 106 07/04/2012   CREATININE 1.0 07/04/2012   BUN 23 07/04/2012   CO2 29 07/04/2012   TSH 0.37 06/23/2011   INR 1.0 01/03/2009   HGBA1C 5.7 02/09/2012  Assessment & Plan:   Left leg pain - initially improved following greater trochanteric bursa injection 72 hours ago, now recurrent localized pain in left low back and lateral calf - exam and symptoms today are radicular. No longer tender over greater trochanteric bursa. Will treat conservatively with ongoing scheduled anti-inflammatories, add scheduled acetaminophen and add muscle relaxer each bedtime and when necessary. We'll also provide tramadol to use when necessary breakthrough pain. Patient provided back exercises for gentle stretch, and she agrees to call if symptoms worse or unimproved for further evaluation and treatment of suspected back source of  pain as needed  Dysuria - less than 48 hours, associated with mild bowel changes. Check UA to rule out UTI. Patient is afebrile and exam benign. Reassurance provided. We'll send antibiotics if abnormal UA -

## 2012-12-19 ENCOUNTER — Ambulatory Visit: Payer: Medicare Other | Admitting: Cardiology

## 2013-01-02 ENCOUNTER — Other Ambulatory Visit: Payer: Self-pay | Admitting: *Deleted

## 2013-01-02 MED ORDER — TRAMADOL HCL 50 MG PO TABS: 50.0000 mg | ORAL_TABLET | Freq: Three times a day (TID) | ORAL | Status: AC | PRN

## 2013-01-02 MED ORDER — TIZANIDINE HCL 4 MG PO TABS: 4.0000 mg | ORAL_TABLET | Freq: Four times a day (QID) | ORAL | Status: DC | PRN

## 2013-01-02 NOTE — Telephone Encounter (Signed)
MD out of office. Pls advise.../lmb 

## 2013-01-02 NOTE — Telephone Encounter (Signed)
[facility] [facility]...lmb

## 2013-01-09 ENCOUNTER — Other Ambulatory Visit: Payer: Self-pay | Admitting: Internal Medicine

## 2013-01-09 NOTE — Telephone Encounter (Signed)
Faxed script back to [facility].../lmb 

## 2013-01-19 ENCOUNTER — Encounter: Payer: Self-pay | Admitting: Cardiology

## 2013-01-19 ENCOUNTER — Ambulatory Visit (INDEPENDENT_AMBULATORY_CARE_PROVIDER_SITE_OTHER): Payer: Medicare Other | Admitting: Cardiology

## 2013-01-19 VITALS — BP 122/62 | HR 76 | Ht 63.0 in | Wt 159.0 lb

## 2013-01-19 DIAGNOSIS — E785 Hyperlipidemia, unspecified: Secondary | ICD-10-CM

## 2013-01-19 DIAGNOSIS — I4891 Unspecified atrial fibrillation: Secondary | ICD-10-CM

## 2013-01-19 DIAGNOSIS — I2510 Atherosclerotic heart disease of native coronary artery without angina pectoris: Secondary | ICD-10-CM

## 2013-01-19 MED ORDER — ROSUVASTATIN CALCIUM 20 MG PO TABS: 20.0000 mg | ORAL_TABLET | Freq: Every day | ORAL | Status: DC

## 2013-01-19 MED ORDER — DRONEDARONE HCL 400 MG PO TABS: 400.0000 mg | ORAL_TABLET | Freq: Two times a day (BID) | ORAL | Status: DC

## 2013-01-19 MED ORDER — POTASSIUM CHLORIDE CRYS ER 10 MEQ PO TBCR: 10.0000 meq | EXTENDED_RELEASE_TABLET | Freq: Every day | ORAL | Status: DC

## 2013-01-19 NOTE — Patient Instructions (Signed)
Your physician recommends that you return for a FASTING lipid profile /BMET in 2 weeks.   Your physician wants you to follow-up in: 6 months with Dr Shirlee Latch. (February 2015). You will receive a reminder letter in the mail two months in advance. If you don't receive a letter, please call our office to schedule the follow-up appointment.

## 2013-01-21 NOTE — Progress Notes (Signed)
Patient ID: Misty Meyer, female   DOB: April 17, 1941, 72 y.o.   MRN: [id number] PCP: Dr. Felicity Coyer  72 yo with history of paroxysmal atrial fibrillation and seizure disorder presents for followup.  She has been seen by Dr. [name] in the past and is seen by me for the first time today.  Recently, she has been in NSR.  She is on Multaq.  Last symptomatic atrial fibrillation was about 3 years ago.  No tachypalpitations.  She had a grand mal seizure 2 years ago and fell/hit her head.  She has not been on anticoagulation due to concern for injury risk with seizure disorder. She has not had any seizures over the last couple of years since she has been on Keppra.   No exertional dyspnea or chest pain.   ECG: NSR, nonspecific T wave flattening, normal QT interval.   Labs (1/13): LDL 69, HDL 56 Labs (2/14): K 3.8, creatinine 1.0  PMH: 1. CAD: Nonobstructive on LHC 8/10.  2. Hyperlipidemia 3. IBS 4. GERD 5. Seizure disorder since childhood. Grand Mal seizure last in 2012. Now on Keppra. 6. Paroxysmal atrial fibrillation.  7. Diverticulosis 8. CCY 9. HTN  SH: Divorced, nonsmoker, lives in [place], hairdresser.  FH: CAD  ROS: All systems reviewed and negative except as per HPI.   Current Outpatient Prescriptions  Medication Sig Dispense Refill  . acetaminophen (TYLENOL) 500 MG tablet Take 500 mg by mouth every 6 (six) hours as needed.       Marland Kitchen albuterol (PROAIR HFA) 108 (90 BASE) MCG/ACT inhaler Inhale 2 puffs into the lungs every 4 (four) hours as needed for wheezing.  3 Inhaler  3  . aspirin 325 MG tablet Take 325 mg by mouth daily.        . beclomethasone (QVAR) 40 MCG/ACT inhaler Inhale 2 puffs into the lungs 2 (two) times daily.  8.7 g  11  . calcium carbonate (OS-CAL - DOSED IN MG OF ELEMENTAL CALCIUM) 1250 MG tablet On hold      . diclofenac (VOLTAREN) 75 MG EC tablet TAKE 1 TABLET BY MOUTH TWICE DAILY AS NEEDED  60 tablet  3  . dronedarone (MULTAQ) 400 MG tablet Take 1 tablet (400 mg  total) by mouth 2 (two) times daily with a meal.  180 tablet  3  . fluticasone (FLONASE) 50 MCG/ACT nasal spray PLACE 2 SPRAYS INTO THE NOSE DAILY AS DIRECTED  16 g  5  . hydrochlorothiazide (HYDRODIURIL) 25 MG tablet Take 1 tablet (25 mg total) by mouth daily.  30 tablet  0  . ibandronate (BONIVA) 150 MG tablet Take 1 tablet (150 mg total) by mouth every 30 (thirty) days. Take in the morning with a full glass of water, on an empty stomach, and do not take anything else by mouth or lie down for the next 30 min.  3 tablet  3  . levETIRAcetam (KEPPRA) 500 MG tablet Take 1 tablet (500 mg total) by mouth 2 (two) times daily.  60 tablet  11  . loperamide (IMODIUM A-D) 2 MG tablet Take 2 mg by mouth 3 (three) times daily as needed. For diarrhea      . metoprolol succinate (TOPROL-XL) 50 MG 24 hr tablet TAKE 1 TABLET BY MOUTH EVERY DAY  90 tablet  3  . Multiple Vitamin (MULTIVITAMIN WITH MINERALS) TABS On hold      . NITROSTAT 0.4 MG SL tablet DISSOLVE 1 TABLET UNDER THE TONGUE EVERY 5 MINUTES AS NEEDED FOR CHEST PAIN, MAY  REPEAT THREE TIMES  25 tablet  3  . PHENobarbital (LUMINAL) 64.8 MG tablet TAKE ONE TABLET BY MOUTH TWICE DAILY  180 tablet  0  . potassium chloride (K-DUR,KLOR-CON) 10 MEQ tablet Take 1 tablet (10 mEq total) by mouth daily.  90 tablet  3  . ranitidine (ZANTAC) 150 MG tablet TAKE 1 TABLET BY MOUTH once DAILY      . rosuvastatin (CRESTOR) 20 MG tablet Take 1 tablet (20 mg total) by mouth daily.  90 tablet  3  . sodium chloride (OCEAN) 0.65 % nasal spray 3 sprays by Nasal route 3 (three) times daily.       . solifenacin (VESICARE) 5 MG tablet Take 1 tablet (5 mg total) by mouth daily.  90 tablet  3  . tiZANidine (ZANAFLEX) 4 MG tablet Take 1 tablet (4 mg total) by mouth every 6 (six) hours as needed.  30 tablet  0  . traMADol (ULTRAM) 50 MG tablet Take 1 tablet (50 mg total) by mouth every 8 (eight) hours as needed for pain.  30 tablet  0  . vitamin C (ASCORBIC ACID) 500 MG tablet On hold       . [DISCONTINUED] potassium chloride (KLOR-CON) 10 MEQ CR tablet Take 1 tablet (10 mEq total) by mouth daily.  30 tablet  11   No current facility-administered medications for this visit.    BP 122/62  Pulse 76  Ht 5\' 3"  (1.6 m)  Wt 72.122 kg (159 lb)  BMI 28.17 kg/m2 General: NAD Neck: No JVD, no thyromegaly or thyroid nodule.  Lungs: Clear to auscultation bilaterally with normal respiratory effort. CV: Nondisplaced PMI.  Heart regular S1/S2, no S3/S4, no murmur.  Trace ankle edema.  No carotid bruit.  Normal pedal pulses.  Abdomen: Soft, nontender, no hepatosplenomegaly, no distention.  Skin: Intact without lesions or rashes.  Neurologic: Alert and oriented x 3.  Psych: Normal affect. Extremities: No clubbing or cyanosis.   Assessment/plan: 1. Atrial fibrillation: Paroxysmal.  CHADSVASC = 3 (age, gender, HTN).  She is not on anticoagulation given fall/injury risk with seizure disorder.  She has not had a seizure in 2 years on Keppra.  If she stays seizure-free, I would consider stopping aspirin and starting Eliquis 5 mg bid.  She will followup in 6 months to reassess.  She will continue dronedarone (seems to be maintaining NSR) and Toprol XL.  QT interval was normal on ECG today.  2. Hyperlipidemia: Good lipids in 1/13.  3. HTN: BP controlled on HCTZ.  Will check BMET in 2 wks as she just started this.    [name] 01/21/2013

## 2013-01-23 ENCOUNTER — Encounter: Payer: Self-pay | Admitting: Internal Medicine

## 2013-01-24 ENCOUNTER — Other Ambulatory Visit: Payer: Self-pay | Admitting: *Deleted

## 2013-01-24 ENCOUNTER — Other Ambulatory Visit: Payer: Self-pay | Admitting: Internal Medicine

## 2013-01-24 MED ORDER — TIZANIDINE HCL 4 MG PO TABS: 4.0000 mg | ORAL_TABLET | Freq: Four times a day (QID) | ORAL | Status: AC | PRN

## 2013-01-25 ENCOUNTER — Other Ambulatory Visit: Payer: Self-pay | Admitting: Internal Medicine

## 2013-01-28 ENCOUNTER — Emergency Department (HOSPITAL_COMMUNITY): Payer: Medicare Other

## 2013-01-28 ENCOUNTER — Observation Stay (HOSPITAL_COMMUNITY)
Admission: EM | Admit: 2013-01-28 | Discharge: 2013-01-29 | Disposition: A | Discharge status: Home / Self Care | Payer: Medicare Other | Attending: Internal Medicine | Admitting: Internal Medicine

## 2013-01-28 ENCOUNTER — Encounter (HOSPITAL_COMMUNITY): Payer: Self-pay | Admitting: Acute Care

## 2013-01-28 DIAGNOSIS — Z7982 Long term (current) use of aspirin: Secondary | ICD-10-CM | POA: Insufficient documentation

## 2013-01-28 DIAGNOSIS — K8020 Calculus of gallbladder without cholecystitis without obstruction: Secondary | ICD-10-CM

## 2013-01-28 DIAGNOSIS — G40909 Epilepsy, unspecified, not intractable, without status epilepticus: Secondary | ICD-10-CM

## 2013-01-28 DIAGNOSIS — I517 Cardiomegaly: Secondary | ICD-10-CM

## 2013-01-28 DIAGNOSIS — E041 Nontoxic single thyroid nodule: Secondary | ICD-10-CM

## 2013-01-28 DIAGNOSIS — M62838 Other muscle spasm: Secondary | ICD-10-CM

## 2013-01-28 DIAGNOSIS — E785 Hyperlipidemia, unspecified: Secondary | ICD-10-CM | POA: Insufficient documentation

## 2013-01-28 DIAGNOSIS — Z79899 Other long term (current) drug therapy: Secondary | ICD-10-CM | POA: Insufficient documentation

## 2013-01-28 DIAGNOSIS — F329 Major depressive disorder, single episode, unspecified: Secondary | ICD-10-CM | POA: Insufficient documentation

## 2013-01-28 DIAGNOSIS — N179 Acute kidney failure, unspecified: Secondary | ICD-10-CM

## 2013-01-28 DIAGNOSIS — R079 Chest pain, unspecified: Secondary | ICD-10-CM | POA: Insufficient documentation

## 2013-01-28 DIAGNOSIS — M129 Arthropathy, unspecified: Secondary | ICD-10-CM | POA: Insufficient documentation

## 2013-01-28 DIAGNOSIS — K589 Irritable bowel syndrome without diarrhea: Secondary | ICD-10-CM | POA: Insufficient documentation

## 2013-01-28 DIAGNOSIS — N3281 Overactive bladder: Secondary | ICD-10-CM

## 2013-01-28 DIAGNOSIS — H9319 Tinnitus, unspecified ear: Secondary | ICD-10-CM

## 2013-01-28 DIAGNOSIS — G40309 Generalized idiopathic epilepsy and epileptic syndromes, not intractable, without status epilepticus: Secondary | ICD-10-CM

## 2013-01-28 DIAGNOSIS — M810 Age-related osteoporosis without current pathological fracture: Secondary | ICD-10-CM | POA: Insufficient documentation

## 2013-01-28 DIAGNOSIS — I480 Paroxysmal atrial fibrillation: Secondary | ICD-10-CM | POA: Diagnosis present

## 2013-01-28 DIAGNOSIS — M1990 Unspecified osteoarthritis, unspecified site: Secondary | ICD-10-CM | POA: Insufficient documentation

## 2013-01-28 DIAGNOSIS — N259 Disorder resulting from impaired renal tubular function, unspecified: Secondary | ICD-10-CM

## 2013-01-28 DIAGNOSIS — I4891 Unspecified atrial fibrillation: Principal | ICD-10-CM | POA: Insufficient documentation

## 2013-01-28 DIAGNOSIS — D649 Anemia, unspecified: Secondary | ICD-10-CM

## 2013-01-28 DIAGNOSIS — I259 Chronic ischemic heart disease, unspecified: Secondary | ICD-10-CM

## 2013-01-28 DIAGNOSIS — I2510 Atherosclerotic heart disease of native coronary artery without angina pectoris: Secondary | ICD-10-CM | POA: Insufficient documentation

## 2013-01-28 DIAGNOSIS — J4540 Moderate persistent asthma, uncomplicated: Secondary | ICD-10-CM

## 2013-01-28 DIAGNOSIS — E876 Hypokalemia: Secondary | ICD-10-CM | POA: Diagnosis present

## 2013-01-28 DIAGNOSIS — M7918 Myalgia, other site: Secondary | ICD-10-CM

## 2013-01-28 DIAGNOSIS — J45909 Unspecified asthma, uncomplicated: Secondary | ICD-10-CM | POA: Insufficient documentation

## 2013-01-28 DIAGNOSIS — K219 Gastro-esophageal reflux disease without esophagitis: Secondary | ICD-10-CM | POA: Insufficient documentation

## 2013-01-28 DIAGNOSIS — I10 Essential (primary) hypertension: Secondary | ICD-10-CM | POA: Insufficient documentation

## 2013-01-28 LAB — BASIC METABOLIC PANEL
BUN: 26 mg/dL — ABNORMAL HIGH (ref 6–23)
CO2: 25 mEq/L (ref 19–32)
Calcium: 7.9 mg/dL — ABNORMAL LOW (ref 8.4–10.5)
GFR calc non Af Amer: 43 mL/min — ABNORMAL LOW (ref >90)
Glucose, Bld: 103 mg/dL — ABNORMAL HIGH (ref 70–99)

## 2013-01-28 LAB — POCT I-STAT TROPONIN I: Troponin i, poc: 0.02 ng/mL (ref 0.00–0.08)

## 2013-01-28 LAB — LIPID PANEL
Cholesterol: 118 mg/dL (ref 0–200)
HDL: 45 mg/dL (ref >39)
Total CHOL/HDL Ratio: 2.6 RATIO

## 2013-01-28 LAB — CBC WITH DIFFERENTIAL/PLATELET
Basophils Absolute: 0 10*3/uL (ref 0.0–0.1)
Basophils Relative: 0 % (ref 0–1)
HCT: 36 % (ref 36.0–46.0)
Lymphocytes Relative: 30 % (ref 12–46)
MCHC: 34.4 g/dL (ref 30.0–36.0)
Monocytes Absolute: 0.7 10*3/uL (ref 0.1–1.0)
Neutro Abs: 4 10*3/uL (ref 1.7–7.7)
Neutrophils Relative %: 60 % (ref 43–77)
RDW: 12.2 % (ref 11.5–15.5)
WBC: 6.8 10*3/uL (ref 4.0–10.5)

## 2013-01-28 LAB — POCT I-STAT, CHEM 8
Creatinine, Ser: 1.4 mg/dL — ABNORMAL HIGH (ref 0.50–1.10)
HCT: 36 % (ref 36.0–46.0)
Hemoglobin: 12.2 g/dL (ref 12.0–15.0)
Potassium: 2.7 mEq/L — CL (ref 3.5–5.1)
Sodium: 139 mEq/L (ref 135–145)
TCO2: 23 mmol/L (ref 0–100)

## 2013-01-28 LAB — PROTIME-INR
INR: 1.03 (ref 0.00–1.49)
Prothrombin Time: 13.3 seconds (ref 11.6–15.2)

## 2013-01-28 LAB — CBC
HCT: 35.7 % — ABNORMAL LOW (ref 36.0–46.0)
MCH: 31.9 pg (ref 26.0–34.0)
MCHC: 34.2 g/dL (ref 30.0–36.0)
MCV: 93.2 fL (ref 78.0–100.0)
RDW: 12.4 % (ref 11.5–15.5)

## 2013-01-28 LAB — TSH: TSH: 0.176 u[IU]/mL — ABNORMAL LOW (ref 0.350–4.500)

## 2013-01-28 MED ORDER — POTASSIUM CHLORIDE 10 MEQ/100ML IV SOLN
10.0000 meq | INTRAVENOUS | Status: AC
  Administered 2013-01-28 (×4): 10 meq via INTRAVENOUS
  Filled 2013-01-28: qty 100
  Filled 2013-01-28: qty 300

## 2013-01-28 MED ORDER — BECLOMETHASONE DIPROPIONATE 40 MCG/ACT IN AERS
2.0000 | INHALATION_SPRAY | Freq: Two times a day (BID) | RESPIRATORY_TRACT | Status: DC
  Administered 2013-01-28 – 2013-01-29 (×2): 2 via RESPIRATORY_TRACT
  Filled 2013-01-28: qty 8.7

## 2013-01-28 MED ORDER — ONDANSETRON HCL 4 MG/2ML IJ SOLN: 4.0000 mg | Freq: Four times a day (QID) | INTRAMUSCULAR | Status: DC | PRN

## 2013-01-28 MED ORDER — ASPIRIN 325 MG PO TABS
325.0000 mg | ORAL_TABLET | Freq: Every day | ORAL | Status: DC
  Filled 2013-01-28: qty 1

## 2013-01-28 MED ORDER — SODIUM CHLORIDE 0.9 % IJ SOLN: 3.0000 mL | INTRAMUSCULAR | Status: DC | PRN

## 2013-01-28 MED ORDER — LOPERAMIDE HCL 2 MG PO CAPS: 2.0000 mg | ORAL_CAPSULE | Freq: Three times a day (TID) | ORAL | Status: DC | PRN

## 2013-01-28 MED ORDER — PHENOBARBITAL 32.4 MG PO TABS
64.8000 mg | ORAL_TABLET | Freq: Two times a day (BID) | ORAL | Status: DC
  Administered 2013-01-28 – 2013-01-29 (×3): 64.8 mg via ORAL
  Filled 2013-01-28 (×3): qty 2

## 2013-01-28 MED ORDER — METOPROLOL SUCCINATE ER 50 MG PO TB24
50.0000 mg | ORAL_TABLET | Freq: Every day | ORAL | Status: DC
  Administered 2013-01-28 – 2013-01-29 (×2): 50 mg via ORAL
  Filled 2013-01-28 (×2): qty 1

## 2013-01-28 MED ORDER — ATORVASTATIN CALCIUM 40 MG PO TABS
40.0000 mg | ORAL_TABLET | Freq: Every day | ORAL | Status: DC
  Administered 2013-01-28: 40 mg via ORAL
  Filled 2013-01-28 (×2): qty 1

## 2013-01-28 MED ORDER — LOPERAMIDE HCL 2 MG PO CAPS
2.0000 mg | ORAL_CAPSULE | Freq: Three times a day (TID) | ORAL | Status: DC | PRN
  Administered 2013-01-28: 2 mg via ORAL
  Filled 2013-01-28 (×2): qty 1

## 2013-01-28 MED ORDER — TRAMADOL HCL 50 MG PO TABS: 50.0000 mg | ORAL_TABLET | Freq: Three times a day (TID) | ORAL | Status: DC | PRN

## 2013-01-28 MED ORDER — SODIUM CHLORIDE 0.9 % IV SOLN: 250.0000 mL | INTRAVENOUS | Status: DC | PRN

## 2013-01-28 MED ORDER — FLUTICASONE PROPIONATE 50 MCG/ACT NA SUSP
2.0000 | Freq: Two times a day (BID) | NASAL | Status: DC
  Administered 2013-01-28 – 2013-01-29 (×3): 2 via NASAL
  Filled 2013-01-28: qty 16

## 2013-01-28 MED ORDER — SODIUM CHLORIDE 0.9 % IJ SOLN
3.0000 mL | Freq: Two times a day (BID) | INTRAMUSCULAR | Status: DC
  Administered 2013-01-28 (×2): 3 mL via INTRAVENOUS

## 2013-01-28 MED ORDER — POTASSIUM CHLORIDE CRYS ER 20 MEQ PO TBCR
40.0000 meq | EXTENDED_RELEASE_TABLET | Freq: Once | ORAL | Status: AC
  Administered 2013-01-28: 40 meq via ORAL
  Filled 2013-01-28: qty 2

## 2013-01-28 MED ORDER — NITROGLYCERIN 0.4 MG SL SUBL: 0.4000 mg | SUBLINGUAL_TABLET | SUBLINGUAL | Status: DC | PRN

## 2013-01-28 MED ORDER — ONDANSETRON HCL 4 MG PO TABS: 4.0000 mg | ORAL_TABLET | Freq: Four times a day (QID) | ORAL | Status: DC | PRN

## 2013-01-28 MED ORDER — DILTIAZEM HCL 100 MG IV SOLR
5.0000 mg/h | INTRAVENOUS | Status: DC
  Administered 2013-01-28: 10 mg/h via INTRAVENOUS
  Administered 2013-01-28: 5 mg/h via INTRAVENOUS
  Filled 2013-01-28: qty 100

## 2013-01-28 MED ORDER — HEPARIN SODIUM (PORCINE) 5000 UNIT/ML IJ SOLN
5000.0000 [IU] | Freq: Three times a day (TID) | INTRAMUSCULAR | Status: DC
  Administered 2013-01-28 (×3): 5000 [IU] via SUBCUTANEOUS
  Filled 2013-01-28 (×8): qty 1

## 2013-01-28 MED ORDER — LEVETIRACETAM 500 MG PO TABS
500.0000 mg | ORAL_TABLET | Freq: Two times a day (BID) | ORAL | Status: DC
  Administered 2013-01-28 – 2013-01-29 (×3): 500 mg via ORAL
  Filled 2013-01-28 (×4): qty 1

## 2013-01-28 MED ORDER — TIZANIDINE HCL 4 MG PO TABS
4.0000 mg | ORAL_TABLET | Freq: Four times a day (QID) | ORAL | Status: DC | PRN
  Filled 2013-01-28: qty 1

## 2013-01-28 MED ORDER — ASPIRIN EC 325 MG PO TBEC
325.0000 mg | DELAYED_RELEASE_TABLET | Freq: Every day | ORAL | Status: DC
  Administered 2013-01-28 – 2013-01-29 (×2): 325 mg via ORAL
  Filled 2013-01-28 (×2): qty 1

## 2013-01-28 MED ORDER — SALINE SPRAY 0.65 % NA SOLN
3.0000 | Freq: Three times a day (TID) | NASAL | Status: DC
  Administered 2013-01-28 – 2013-01-29 (×4): 3 via NASAL
  Filled 2013-01-28: qty 44

## 2013-01-28 MED ORDER — ACETAMINOPHEN 500 MG PO TABS
500.0000 mg | ORAL_TABLET | Freq: Four times a day (QID) | ORAL | Status: DC | PRN
  Filled 2013-01-28: qty 1

## 2013-01-28 MED ORDER — DRONEDARONE HCL 400 MG PO TABS
400.0000 mg | ORAL_TABLET | Freq: Two times a day (BID) | ORAL | Status: DC
  Administered 2013-01-28 – 2013-01-29 (×3): 400 mg via ORAL
  Filled 2013-01-28 (×5): qty 1

## 2013-01-28 MED ORDER — PHENOBARBITAL 64.8 MG PO TABS: 64.8000 mg | ORAL_TABLET | Freq: Two times a day (BID) | ORAL | Status: DC

## 2013-01-28 MED ORDER — DICLOFENAC SODIUM 75 MG PO TBEC
75.0000 mg | DELAYED_RELEASE_TABLET | Freq: Two times a day (BID) | ORAL | Status: DC
  Administered 2013-01-28 – 2013-01-29 (×3): 75 mg via ORAL
  Filled 2013-01-28 (×4): qty 1

## 2013-01-28 MED ORDER — FAMOTIDINE 20 MG PO TABS
20.0000 mg | ORAL_TABLET | Freq: Every day | ORAL | Status: DC
  Administered 2013-01-28 – 2013-01-29 (×2): 20 mg via ORAL
  Filled 2013-01-28 (×2): qty 1

## 2013-01-28 NOTE — ED Notes (Signed)
cardizem gtt inc. To 20 mg/hr . Dr. Nicanor Alcon.

## 2013-01-28 NOTE — Progress Notes (Addendum)
  Dr. Aletta Edouard consult note reviewed. Patient seen and examined.  Telemetry reviewed personally.   She is back in NSR. K+ being repleted. Maintained on Multaq.  Would continue Multaq for now. It is unclear whether AF was precipitated by hypokalemia but it seems that the same thing happened several years ago. Would observe overnight. Supp K+ >= 4.0. Stop HCTZ.   If no further AF can likely d/c home tomorrow on Multaq (off HCTZ) with a 2 week event monitor. If AF burden high would likely need to switch anti-arrhythmic agent. If diuretic needed would use spiro 12.5 daily.  Will repeat BMET at 4pm.   F/u Dr. Neale Burly Anaiyah Anglemyer,MD 12:28 PM

## 2013-01-28 NOTE — ED Notes (Signed)
MD at bedside. Cardiology at bedside. Pt updated on status. Since pts. Change in Rhythm to SR. SBP 89/47 and asymptomatic.

## 2013-01-28 NOTE — ED Notes (Signed)
cardi

## 2013-01-28 NOTE — Progress Notes (Signed)
  Echocardiogram 2D Echocardiogram has been performed.  Georgian Co 01/28/2013, 5:56 PM

## 2013-01-28 NOTE — H&P (Signed)
PCP:   Rene Paci, MD   Chief Complaint:  Chest pain  HPI: This is a 72 year old female, with known history of non-obstructive CAD, per cardiac cath 2010, HTN, paroxysmal atrial fibrillation, dyslipidemia, IBS, GERD, diverticulosis, major depressive disorder, allergic rhinitis, OA, osteoporosis. According to patient, she was quite fine, until close to midnight on 01/25/13, when while in bed, she suddenly developed palpitations, without chest pain or SOB. She called 911, and when EMS arrived, she was found to have a HR of about 78/min. EMS went away. She remained asymptomatic thereafter, until she went to bed at about 11:30 PM on 01/27/13. This time, she felt retrosternal chest pressure, and left upper arm discomfort, without SOB. She once again, called 911. This time, when EMS arrived, HR was found to be in the 150s-190s. She was brought to the ED, where telemetry and EKG revealed fast atrial fibrillation. Patient was administered inv Cardizem bolus x 2 and commenced on ivi Cardizem drip, with reversion to NSR.    Allergies:   Allergies  Allergen Reactions  . Codeine Other (See Comments)    Caused possible hallucinations and anxiety  . Latex Rash  . Neomycin-Bacitracin Zn-Polymyx Itching and Rash  . Penicillins Swelling    Lips      Past Medical History  Diagnosis Date  . CAD (coronary artery disease)     nonobst cath 12/2008  . HLD (hyperlipidemia)   . Calculus of gallbladder without mention of cholecystitis or obstruction 03/2011    s/p lap chole  . IBS (irritable bowel syndrome)   . GERD (gastroesophageal reflux disease)   . Tinnitus   . Seizure disorder     Grand mal seizure 7/12, [facility]  . Anemia   . Allergic rhinitis, cause unspecified   . Major depressive disorder, single episode, severe, without mention of psychotic behavior   . Arthritis of back   . Atrial fibrillation     on Multaq  . Asthma   . Osteoporosis     DEXA 04/2011: -3.1 L fem, on Boniva  .  Diverticulosis   . Seizures     Past Surgical History  Procedure Laterality Date  . Total abdominal hysterectomy    . Wrist fracture surgery      with plates (left)  . Tonsillectomy      age 31  . Cholecystectomy  03/25/2011    Prior to Admission medications   Medication Sig Start Date End Date Taking? Authorizing Provider  acetaminophen (TYLENOL) 500 MG tablet Take 500 mg by mouth every 6 (six) hours as needed for pain.    Yes Historical Provider, MD  albuterol (PROAIR HFA) 108 (90 BASE) MCG/ACT inhaler Inhale 2 puffs into the lungs every 4 (four) hours as needed for wheezing. 09/30/12  Yes Newt Lukes, MD  aspirin 325 MG tablet Take 325 mg by mouth daily.     Yes Historical Provider, MD  beclomethasone (QVAR) 40 MCG/ACT inhaler Inhale 2 puffs into the lungs 2 (two) times daily. 02/10/12  Yes Storm Frisk, MD  diclofenac (VOLTAREN) 75 MG EC tablet Take 75 mg by mouth 2 (two) times daily.   Yes Historical Provider, MD  dronedarone (MULTAQ) 400 MG tablet Take 1 tablet (400 mg total) by mouth 2 (two) times daily with a meal. 01/19/13  Yes Laurey Morale, MD  fluticasone [facility]) 50 MCG/ACT nasal spray Place 2 sprays into the nose 2 (two) times daily.   Yes Historical Provider, MD  ibandronate (BONIVA)  150 MG tablet Take 1 tablet (150 mg total) by mouth every 30 (thirty) days. Take in the morning with a full glass of water, on an empty stomach, and do not take anything else by mouth or lie down for the next 30 min. 12/13/12  Yes Newt Lukes, MD  levETIRAcetam (KEPPRA) 500 MG tablet Take 1 tablet (500 mg total) by mouth 2 (two) times daily. 09/12/12  Yes Nilda Riggs, NP  loperamide (IMODIUM A-D) 2 MG tablet Take 2 mg by mouth 3 (three) times daily as needed. For diarrhea   Yes Historical Provider, MD  metoprolol succinate (TOPROL-XL) 50 MG 24 hr tablet Take 50 mg by mouth daily. Take with or immediately following a meal.   Yes Historical Provider, MD  nitroGLYCERIN  (NITROSTAT) 0.4 MG SL tablet Place 0.4 mg under the tongue every 5 (five) minutes as needed for chest pain.   Yes Historical Provider, MD  PHENobarbital (LUMINAL) 64.8 MG tablet Take 64.8 mg by mouth 2 (two) times daily.   Yes Historical Provider, MD  potassium chloride (K-DUR,KLOR-CON) 10 MEQ tablet Take 1 tablet (10 mEq total) by mouth daily. 01/19/13  Yes Laurey Morale, MD  ranitidine (ZANTAC) 150 MG tablet Take 150 mg by mouth daily.  09/30/12  Yes Newt Lukes, MD  rosuvastatin (CRESTOR) 20 MG tablet Take 1 tablet (20 mg total) by mouth daily. 01/19/13  Yes Laurey Morale, MD  sodium chloride (OCEAN) 0.65 % nasal spray 3 sprays by Nasal route 3 (three) times daily.    Yes Historical Provider, MD  tiZANidine (ZANAFLEX) 4 MG tablet Take 1 tablet (4 mg total) by mouth every 6 (six) hours as needed. 01/24/13  Yes Newt Lukes, MD  traMADol (ULTRAM) 50 MG tablet Take 1 tablet (50 mg total) by mouth every 8 (eight) hours as needed for pain. 01/02/13  Yes Nicki Reaper, NP    Social History: Patient reports that she has never smoked. She has never used smokeless tobacco. She reports that she does not drink alcohol or use illicit drugs.  Family History  Problem Relation Age of Onset  . Angina Mother   . Alzheimer's disease Mother   . Heart attack Father   . Heart failure Father   . Heart disease Father   . Heart disease Brother   . Breast cancer Maternal Aunt   . Cancer Maternal Aunt     breast  . Cervical cancer Maternal Aunt   . Cancer Maternal Aunt     ovarian  . Colon cancer Neg Hx     Review of Systems:  As per HPI and chief complaint. Patent denies fatigue, diminished appetite, weight loss, fever, chills, headache, blurred vision, difficulty in speaking, dysphagia, cough, shortness of breath, orthopnea, paroxysmal nocturnal dyspnea, nausea, diaphoresis, abdominal pain, vomiting, diarrhea, belching, heartburn, hematemesis, melena, dysuria, nocturia, urinary frequency,  hematochezia, lower extremity swelling, pain, or redness. The rest of the systems review is negative.  Physical Exam:  General:  Patient does not appear to be in obvious acute distress. Alert, communicative, fully oriented, talking in complete sentences, not short of breath at rest.  HEENT:  No clinical pallor, no jaundice, no conjunctival injection or discharge. Hydration status is fair.  NECK:  Supple, JVP not seen, no carotid bruits, no palpable lymphadenopathy, no palpable goiter. CHEST:  Clinically clear to auscultation, no wheezes, no crackles. HEART:  Sounds 1 and 2 heard, normal, regular, no murmurs. ABDOMEN:  Full, soft, non-tender, no palpable organomegaly, no  palpable masses, normal bowel sounds. GENITALIA:  Not examined. LOWER EXTREMITIES:  No pitting edema, palpable peripheral pulses. MUSCULOSKELETAL SYSTEM:  Generalized osteoarthritic changes, otherwise, normal. CENTRAL NERVOUS SYSTEM:  No focal neurologic deficit on gross examination.  Labs on Admission:  Results for orders placed during the hospital encounter of 01/28/13 (from the past 48 hour(s))  CBC WITH DIFFERENTIAL     Status: None   Collection Time    01/28/13  3:55 AM      Result Value Range   WBC 6.8  4.0 - 10.5 K/uL   RBC 3.90  3.87 - 5.11 MIL/uL   Hemoglobin 12.4  12.0 - 15.0 g/dL   HCT 08.6  57.8 - 46.9 %   MCV 92.3  78.0 - 100.0 fL   MCH 31.8  26.0 - 34.0 pg   MCHC 34.4  30.0 - 36.0 g/dL   RDW 62.9  52.8 - 41.3 %   Platelets 172  150 - 400 K/uL   Neutrophils Relative % 60  43 - 77 %   Neutro Abs 4.0  1.7 - 7.7 K/uL   Lymphocytes Relative 30  12 - 46 %   Lymphs Abs 2.0  0.7 - 4.0 K/uL   Monocytes Relative 10  3 - 12 %   Monocytes Absolute 0.7  0.1 - 1.0 K/uL   Eosinophils Relative 0  0 - 5 %   Eosinophils Absolute 0.0  0.0 - 0.7 K/uL   Basophils Relative 0  0 - 1 %   Basophils Absolute 0.0  0.0 - 0.1 K/uL  PROTIME-INR     Status: None   Collection Time    01/28/13  3:55 AM      Result Value Range    Prothrombin Time 13.3  11.6 - 15.2 seconds   INR 1.03  0.00 - 1.49  POCT I-STAT TROPONIN I     Status: None   Collection Time    01/28/13  4:05 AM      Result Value Range   Troponin i, poc 0.02  0.00 - 0.08 ng/mL   Comment 3            Comment: Due to the release kinetics of cTnI,     a negative result within the first hours     of the onset of symptoms does not rule out     myocardial infarction with certainty.     If myocardial infarction is still suspected,     repeat the test at appropriate intervals.  POCT I-STAT, CHEM 8     Status: Abnormal   Collection Time    01/28/13  4:06 AM      Result Value Range   Sodium 139  135 - 145 mEq/L   Potassium 2.7 (*) 3.5 - 5.1 mEq/L   Chloride 104  96 - 112 mEq/L   BUN 27 (*) 6 - 23 mg/dL   Creatinine, Ser 2.44 (*) 0.50 - 1.10 mg/dL   Glucose, Bld 010 (*) 70 - 99 mg/dL   Calcium, Ion 2.72 (*) 1.13 - 1.30 mmol/L   TCO2 23  0 - 100 mmol/L   Hemoglobin 12.2  12.0 - 15.0 g/dL   HCT 53.6  64.4 - 03.4 %   Comment NOTIFIED PHYSICIAN    MAGNESIUM     Status: None   Collection Time    01/28/13  4:19 AM      Result Value Range   Magnesium 2.3  1.5 - 2.5 mg/dL    Radiological  Exams on Admission: Dg Chest Port 1 View  01/28/2013   *RADIOLOGY REPORT*  Clinical Data: Tachycardia, left chest pain  PORTABLE CHEST - 1 VIEW  Comparison: Prior radiograph 04/29/2012  Findings: The cardiac and mediastinal silhouettes are stable in size and contour, and remain within normal limits.  Lungs are normally inflated.  No airspace consolidation, pleural effusion, or pulmonary edema identified.  Linear opacity overlying the left heart border may reflect atelectasis / scarring within the lingula, unchanged.  There is no pneumothorax.  No acute osseous abnormality identified.  Degenerative spurring is noted about the right AC joint.  IMPRESSION: No acute cardiopulmonary process.   Original Report Authenticated By: Rise Mu, M.D.     Assessment/Plan Active Problems:    1. PAF (paroxysmal atrial fibrillation): Patient presented with recurrent episodes of palpitations since 01/25/13, culminating in chest discomfort on 01/27/13. She was found to be in rapid atrial fibrillation, with HR in the 150s-190s. Fortunately she quickly reverted to NSR, with administration iv Cardizem. CXR was devoid of acute findings, and patient has no clinical features of CHF. She has had no recurrence of chest discomfort, and initial Troponin is negative. Will complete cycling cardiac enzymes, arrange 2D echocardiogram, and check TSH. Dr Anne Ng provided cardiology consultation, and we shall manage as recommended. Patient is reportedly, not considered a candidate for anticoagulation given fall/injury risk with seizure disorder, although she has a CHADSVASC = 3.  2. Hypokalemia: Serum potassium was significantly low at 2.7, on presentation. According to patient she was started on HCTZ about  A week prior, which is the likely culprit. Diuretics have been discontinued, and patient has already been administered potasium supplements in the ED. Likely, hypokalemia was contributory to A. Fib. Fortunately magnesium is normal at 2.3.  3. HTN: Controlled.  4. CAD: Known history of non-obstructive CAD, per cardiac cath 2010. Clinically, no evidence of ACS at this time.  5. Dyslipidemia: On statin, which has been continued. Will check lipid profile.  6. IBS (irritable bowel syndrome)/GERD (gastroesophageal reflux disease): Stable and not problematic.  7. Major depressive disorder: Stable on pre-admission psychotropics.  8. Seizure disorder: Patient has been asymptomatic from this view point, for 2 years. Continued on pre-admission anticonvulsants.   Further management will depend on clinical course.   Comment: Patient is FULL CODE.   Time Spent on Admission: 1 Hour.   Keona Bilyeu,CHRISTOPHER 01/28/2013, 8:14 AM

## 2013-01-28 NOTE — ED Provider Notes (Signed)
CSN: [id number]     Arrival date & time 01/28/13  4098 History   First MD Initiated Contact with Patient 01/28/13 [phone]     Chief Complaint  Patient presents with  . Chest Pain  . Tachycardia   (Consider location/radiation/quality/duration/timing/severity/associated sxs/prior Treatment) Patient is a 72 y.o. female presenting with chest pain. The history is provided by the patient.  Chest Pain Pain location:  L chest Pain quality: dull   Pain radiates to:  L arm Pain radiates to the back: no   Pain severity:  Moderate Onset quality:  Sudden Timing:  Constant Progression:  Unchanged Chronicity:  Recurrent Context: at rest   Relieved by:  Nothing Worsened by:  Nothing tried Ineffective treatments:  None tried Associated symptoms: palpitations   Associated symptoms: no nausea, no shortness of breath and not vomiting   Risk factors: coronary artery disease     Past Medical History  Diagnosis Date  . CAD (coronary artery disease)     nonobst cath 12/2008  . HLD (hyperlipidemia)   . Calculus of gallbladder without mention of cholecystitis or obstruction 03/2011    s/p lap chole  . IBS (irritable bowel syndrome)   . GERD (gastroesophageal reflux disease)   . Tinnitus   . Seizure disorder     Grand mal seizure 7/12, [facility]  . Anemia   . Allergic rhinitis, cause unspecified   . Major depressive disorder, single episode, severe, without mention of psychotic behavior   . Arthritis of back   . Atrial fibrillation     on Multaq  . Asthma   . Osteoporosis     DEXA 04/2011: -3.1 L fem, on Boniva  . Diverticulosis   . Seizures    Past Surgical History  Procedure Laterality Date  . Total abdominal hysterectomy    . Wrist fracture surgery      with plates (left)  . Tonsillectomy      age 48  . Cholecystectomy  03/25/2011   Family History  Problem Relation Age of Onset  . Angina Mother   . Alzheimer's disease Mother   . Heart attack Father   . Heart failure Father    . Heart disease Father   . Heart disease Brother   . Breast cancer Maternal Aunt   . Cancer Maternal Aunt     breast  . Cervical cancer Maternal Aunt   . Cancer Maternal Aunt     ovarian  . Colon cancer Neg Hx    History  Substance Use Topics  . Smoking status: Never Smoker   . Smokeless tobacco: Never Used  . Alcohol Use: No     Comment: quit 2012   OB History   Grav Para Term Preterm Abortions TAB SAB Ect Mult Living                 Review of Systems  Respiratory: Negative for shortness of breath.   Cardiovascular: Positive for chest pain and palpitations.  Gastrointestinal: Negative for nausea and vomiting.  All other systems reviewed and are negative.    Allergies  Codeine; Latex; Neomycin-bacitracin zn-polymyx; and Penicillins  Home Medications   Current Outpatient Rx  Name  Route  Sig  Dispense  Refill  . acetaminophen (TYLENOL) 500 MG tablet   Oral   Take 500 mg by mouth every 6 (six) hours as needed.          Marland Kitchen albuterol (PROAIR HFA) 108 (90 BASE) MCG/ACT inhaler   Inhalation  Inhale 2 puffs into the lungs every 4 (four) hours as needed for wheezing.   3 Inhaler   3   . aspirin 325 MG tablet   Oral   Take 325 mg by mouth daily.           . beclomethasone (QVAR) 40 MCG/ACT inhaler   Inhalation   Inhale 2 puffs into the lungs 2 (two) times daily.   8.7 g   11   . calcium carbonate (OS-CAL - DOSED IN MG OF ELEMENTAL CALCIUM) 1250 MG tablet      On hold         . diclofenac (VOLTAREN) 75 MG EC tablet      TAKE 1 TABLET BY MOUTH TWICE DAILY AS NEEDED   180 tablet   3     **Patient requests 90 days supply**   . dronedarone (MULTAQ) 400 MG tablet   Oral   Take 1 tablet (400 mg total) by mouth 2 (two) times daily with a meal.   180 tablet   3   . fluticasone (FLONASE) 50 MCG/ACT nasal spray      PLACE 2 SPRAYS INTO THE NOSE DAILY AS DIRECTED   16 g   5   . hydrochlorothiazide (HYDRODIURIL) 25 MG tablet   Oral   Take 1 tablet  (25 mg total) by mouth daily.   30 tablet   0   . ibandronate (BONIVA) 150 MG tablet   Oral   Take 1 tablet (150 mg total) by mouth every 30 (thirty) days. Take in the morning with a full glass of water, on an empty stomach, and do not take anything else by mouth or lie down for the next 30 min.   3 tablet   3   . levETIRAcetam (KEPPRA) 500 MG tablet   Oral   Take 1 tablet (500 mg total) by mouth 2 (two) times daily.   60 tablet   11   . loperamide (IMODIUM A-D) 2 MG tablet   Oral   Take 2 mg by mouth 3 (three) times daily as needed. For diarrhea         . metoprolol succinate (TOPROL-XL) 50 MG 24 hr tablet      TAKE 1 TABLET BY MOUTH EVERY DAY   90 tablet   3   . Multiple Vitamin (MULTIVITAMIN WITH MINERALS) TABS      On hold         . NITROSTAT 0.4 MG SL tablet      DISSOLVE 1 TABLET UNDER THE TONGUE EVERY 5 MINUTES AS NEEDED FOR CHEST PAIN, MAY REPEAT THREE TIMES   25 tablet   3   . PHENobarbital (LUMINAL) 64.8 MG tablet      TAKE ONE TABLET BY MOUTH TWICE DAILY   180 tablet   0   . potassium chloride (K-DUR,KLOR-CON) 10 MEQ tablet   Oral   Take 1 tablet (10 mEq total) by mouth daily.   90 tablet   3   . ranitidine (ZANTAC) 150 MG tablet      TAKE 1 TABLET BY MOUTH once DAILY         . rosuvastatin (CRESTOR) 20 MG tablet   Oral   Take 1 tablet (20 mg total) by mouth daily.   90 tablet   3   . sodium chloride (OCEAN) 0.65 % nasal spray   Nasal   3 sprays by Nasal route 3 (three) times daily.          Marland Kitchen  solifenacin (VESICARE) 5 MG tablet   Oral   Take 1 tablet (5 mg total) by mouth daily.   90 tablet   3   . tiZANidine (ZANAFLEX) 4 MG tablet   Oral   Take 1 tablet (4 mg total) by mouth every 6 (six) hours as needed.   30 tablet   0   . traMADol (ULTRAM) 50 MG tablet   Oral   Take 1 tablet (50 mg total) by mouth every 8 (eight) hours as needed for pain.   30 tablet   0   . vitamin C (ASCORBIC ACID) 500 MG tablet      On hold           BP 121/69  Temp(Src) 98 F (36.7 C) (Oral)  Resp 20  SpO2 98% Physical Exam  Constitutional: She is oriented to person, place, and time. She appears well-developed and well-nourished.  HENT:  Head: Normocephalic and atraumatic.  Mouth/Throat: Oropharynx is clear and moist.  Eyes: Conjunctivae are normal. Pupils are equal, round, and reactive to light.  Neck: Normal range of motion. Neck supple.  Cardiovascular: Intact distal pulses.  An irregularly irregular rhythm present. Tachycardia present.   Pulmonary/Chest: Effort normal. She has no wheezes. She has no rales.  Abdominal: Soft. Bowel sounds are normal. There is no tenderness. There is no rebound and no guarding.  Musculoskeletal: Normal range of motion.  Neurological: She is alert and oriented to person, place, and time.  Skin: Skin is warm and dry. She is not diaphoretic.  Psychiatric: She has a normal mood and affect.    ED Course  Procedures (including critical care time) Labs Review Labs Reviewed  CBC WITH DIFFERENTIAL  PROTIME-INR   Imaging Review No results found.  MDM   Date: 01/28/2013  Rate: 145  Rhythm: atrial fibrillation  QRS Axis: left  Intervals: normal  ST/T Wave abnormalities: nonspecific ST changes  Conduction Disutrbances:none  Narrative Interpretation:   Old EKG Reviewed: changes noted   MDM Reviewed: previous chart, nursing note and vitals Reviewed previous: ECG and labs Interpretation: ECG and labs Total time providing critical care: > 105 minutes. This excludes time spent performing separately reportable procedures and services. Consults: admitting MD   Results for orders placed during the hospital encounter of 01/28/13  CBC WITH DIFFERENTIAL      Result Value Range   WBC 6.8  4.0 - 10.5 K/uL   RBC 3.90  3.87 - 5.11 MIL/uL   Hemoglobin 12.4  12.0 - 15.0 g/dL   HCT 11.9  14.7 - 82.9 %   MCV 92.3  78.0 - 100.0 fL   MCH 31.8  26.0 - 34.0 pg   MCHC 34.4  30.0 - 36.0 g/dL    RDW 56.2  13.0 - 86.5 %   Platelets 172  150 - 400 K/uL   Neutrophils Relative % 60  43 - 77 %   Neutro Abs 4.0  1.7 - 7.7 K/uL   Lymphocytes Relative 30  12 - 46 %   Lymphs Abs 2.0  0.7 - 4.0 K/uL   Monocytes Relative 10  3 - 12 %   Monocytes Absolute 0.7  0.1 - 1.0 K/uL   Eosinophils Relative 0  0 - 5 %   Eosinophils Absolute 0.0  0.0 - 0.7 K/uL   Basophils Relative 0  0 - 1 %   Basophils Absolute 0.0  0.0 - 0.1 K/uL  PROTIME-INR      Result Value Range   Prothrombin  Time 13.3  11.6 - 15.2 seconds   INR 1.03  0.00 - 1.49  MAGNESIUM      Result Value Range   Magnesium 2.3  1.5 - 2.5 mg/dL  POCT I-STAT, CHEM 8      Result Value Range   Sodium 139  135 - 145 mEq/L   Potassium 2.7 (*) 3.5 - 5.1 mEq/L   Chloride 104  96 - 112 mEq/L   BUN 27 (*) 6 - 23 mg/dL   Creatinine, Ser 1.61 (*) 0.50 - 1.10 mg/dL   Glucose, Bld 096 (*) 70 - 99 mg/dL   Calcium, Ion 0.45 (*) 1.13 - 1.30 mmol/L   TCO2 23  0 - 100 mmol/L   Hemoglobin 12.2  12.0 - 15.0 g/dL   HCT 40.9  81.1 - 91.4 %   Comment NOTIFIED PHYSICIAN    POCT I-STAT TROPONIN I      Result Value Range   Troponin i, poc 0.02  0.00 - 0.08 ng/mL   Comment 3            Dg Chest Port 1 View  01/28/2013   *RADIOLOGY REPORT*  Clinical Data: Tachycardia, left chest pain  PORTABLE CHEST - 1 VIEW  Comparison: Prior radiograph 04/29/2012  Findings: The cardiac and mediastinal silhouettes are stable in size and contour, and remain within normal limits.  Lungs are normally inflated.  No airspace consolidation, pleural effusion, or pulmonary edema identified.  Linear opacity overlying the left heart border may reflect atelectasis / scarring within the lingula, unchanged.  There is no pneumothorax.  No acute osseous abnormality identified.  Degenerative spurring is noted about the right AC joint.  IMPRESSION: No acute cardiopulmonary process.   Original Report Authenticated By: Rise Mu, M.D.    CRITICAL CARE Performed by:  Jasmine Awe Total critical care time: 120 minutes Critical care time was exclusive of separately billable procedures and treating other patients. Critical care was necessary to treat or prevent imminent or life-threatening deterioration. Critical care was time spent personally by me on the following activities: development of treatment plan with patient and/or surrogate as well as nursing, discussions with consultants, evaluation of patient's response to treatment, examination of patient, obtaining history from patient or surrogate, ordering and performing treatments and interventions, ordering and review of laboratory studies, ordering and review of radiographic studies, pulse oximetry and re-evaluation of patient's condition.  Medications  diltiazem (CARDIZEM) 100 mg in dextrose 5 % 100 mL infusion (15 mg/hr Intravenous Rate/Dose Change 01/28/13 0508)  potassium chloride 10 mEq in 100 mL IVPB (not administered)  potassium chloride SA (K-DUR,KLOR-CON) CR tablet 40 mEq (not administered)      Misty Meyer Smitty Cords, MD 01/28/13 [phone]

## 2013-01-28 NOTE — ED Notes (Signed)
Pain between rib cage and lower sternum.  Started ~ 12 mn. Just chest wall pain.  No sob, no n/v, no dizziness. Hr 160 - 196; ems gave 10 mg of cardizem iv,  And HR in 80's, bp dropped to sbp 90's, then give 200 cc ns, and bp 124/61.  20 ga. Lt. A/c.

## 2013-01-28 NOTE — ED Notes (Signed)
MD at bedside. 

## 2013-01-28 NOTE — Consult Note (Signed)
Reason for Consult: Atrial Fibrillation with rapid ventricular rate Referring Physician: [name] is an 72 y.o. female.  HPI:  Misty Meyer is a 72 year old female with paroxysmal atrial fibrillation and a seizure disorder. She has had PAF for several years. She presents to the ED today with a racing heart. She noticed this earlier this morning. This is the second episode of fast heart rate that she has had in the past several days. She had an episode about 3 days ago. She called EMS who came over and found her to have a fast irregular rhythm. This converted to sinus rhythm while they were there and she decided not to come to the hospital. This evening she felt a little lightheaded when she noticed the racing heart. She decided to come to the ED. Initially she was tachycardic with normal blood pressure. Her rhythm was atrial fibrillation with RVR. She was given bolus of cardizem x 2 and a cardizem drip. The drip was titrated as high as 20mg  per hour. She was noted to be hypokalemic as well. She received 40 meq of oral potassium and of IV potassium. She also received 1L of normal saline. She spontaneously converted to sinus rhythm sometime around 7 am.  She tells me that she has had a similar presentation several years ago that was related to the initiation of a diuretic. She says that she was again started on a diuretic 1-2 weeks ago. She wasn't able to tell me the name, but perhaps it was HCTZ.  Misty Meyer states that she has been on multaq for her AF for several years with good success. She has not had any AF, prior to this week, for several years. She is not on systemic anticoagulation because of a seizure disorder. Thankfully she has not had any seizures for over 2 years.    Past Medical History  Diagnosis Date  . CAD (coronary artery disease)     nonobst cath 12/2008  . HLD (hyperlipidemia)   . Calculus of gallbladder without mention of cholecystitis or obstruction 03/2011   s/p lap chole  . IBS (irritable bowel syndrome)   . GERD (gastroesophageal reflux disease)   . Tinnitus   . Seizure disorder     Grand mal seizure 7/12, [facility]  . Anemia   . Allergic rhinitis, cause unspecified   . Major depressive disorder, single episode, severe, without mention of psychotic behavior   . Arthritis of back   . Atrial fibrillation     on Multaq  . Asthma   . Osteoporosis     DEXA 04/2011: -3.1 L fem, on Boniva  . Diverticulosis   . Seizures     Past Surgical History  Procedure Laterality Date  . Total abdominal hysterectomy    . Wrist fracture surgery      with plates (left)  . Tonsillectomy      age 89  . Cholecystectomy  03/25/2011    Family History  Problem Relation Age of Onset  . Angina Mother   . Alzheimer's disease Mother   . Heart attack Father   . Heart failure Father   . Heart disease Father   . Heart disease Brother   . Breast cancer Maternal Aunt   . Cancer Maternal Aunt     breast  . Cervical cancer Maternal Aunt   . Cancer Maternal Aunt     ovarian  . Colon cancer Neg Hx     Social  History:  reports that she has never smoked. She has never used smokeless tobacco. She reports that she does not drink alcohol or use illicit drugs.  Allergies:  Allergies  Allergen Reactions  . Codeine Other (See Comments)    Caused possible hallucinations and anxiety  . Latex Rash  . Neomycin-Bacitracin Zn-Polymyx Itching and Rash  . Penicillins Swelling    Lips    Medications: I have reviewed the patient's current medications.  Results for orders placed during the hospital encounter of 01/28/13 (from the past 48 hour(s))  CBC WITH DIFFERENTIAL     Status: None   Collection Time    01/28/13  3:55 AM      Result Value Range   WBC 6.8  4.0 - 10.5 K/uL   RBC 3.90  3.87 - 5.11 MIL/uL   Hemoglobin 12.4  12.0 - 15.0 g/dL   HCT 16.1  09.6 - 04.5 %   MCV 92.3  78.0 - 100.0 fL   MCH 31.8  26.0 - 34.0 pg   MCHC 34.4  30.0 - 36.0 g/dL    RDW 40.9  81.1 - 91.4 %   Platelets 172  150 - 400 K/uL   Neutrophils Relative % 60  43 - 77 %   Neutro Abs 4.0  1.7 - 7.7 K/uL   Lymphocytes Relative 30  12 - 46 %   Lymphs Abs 2.0  0.7 - 4.0 K/uL   Monocytes Relative 10  3 - 12 %   Monocytes Absolute 0.7  0.1 - 1.0 K/uL   Eosinophils Relative 0  0 - 5 %   Eosinophils Absolute 0.0  0.0 - 0.7 K/uL   Basophils Relative 0  0 - 1 %   Basophils Absolute 0.0  0.0 - 0.1 K/uL  PROTIME-INR     Status: None   Collection Time    01/28/13  3:55 AM      Result Value Range   Prothrombin Time 13.3  11.6 - 15.2 seconds   INR 1.03  0.00 - 1.49  POCT I-STAT TROPONIN I     Status: None   Collection Time    01/28/13  4:05 AM      Result Value Range   Troponin i, poc 0.02  0.00 - 0.08 ng/mL   Comment 3            Comment: Due to the release kinetics of cTnI,     a negative result within the first hours     of the onset of symptoms does not rule out     myocardial infarction with certainty.     If myocardial infarction is still suspected,     repeat the test at appropriate intervals.  POCT I-STAT, CHEM 8     Status: Abnormal   Collection Time    01/28/13  4:06 AM      Result Value Range   Sodium 139  135 - 145 mEq/L   Potassium 2.7 (*) 3.5 - 5.1 mEq/L   Chloride 104  96 - 112 mEq/L   BUN 27 (*) 6 - 23 mg/dL   Creatinine, Ser 7.82 (*) 0.50 - 1.10 mg/dL   Glucose, Bld 956 (*) 70 - 99 mg/dL   Calcium, Ion 2.13 (*) 1.13 - 1.30 mmol/L   TCO2 23  0 - 100 mmol/L   Hemoglobin 12.2  12.0 - 15.0 g/dL   HCT 08.6  57.8 - 46.9 %   Comment NOTIFIED PHYSICIAN    MAGNESIUM  Status: None   Collection Time    01/28/13  4:19 AM      Result Value Range   Magnesium 2.3  1.5 - 2.5 mg/dL    No current facility-administered medications on file prior to encounter.   Current Outpatient Prescriptions on File Prior to Encounter  Medication Sig Dispense Refill  . acetaminophen (TYLENOL) 500 MG tablet Take 500 mg by mouth every 6 (six) hours as needed for  pain.       Marland Kitchen albuterol (PROAIR HFA) 108 (90 BASE) MCG/ACT inhaler Inhale 2 puffs into the lungs every 4 (four) hours as needed for wheezing.  3 Inhaler  3  . aspirin 325 MG tablet Take 325 mg by mouth daily.        . beclomethasone (QVAR) 40 MCG/ACT inhaler Inhale 2 puffs into the lungs 2 (two) times daily.  8.7 g  11  . dronedarone (MULTAQ) 400 MG tablet Take 1 tablet (400 mg total) by mouth 2 (two) times daily with a meal.  180 tablet  3  . ibandronate (BONIVA) 150 MG tablet Take 1 tablet (150 mg total) by mouth every 30 (thirty) days. Take in the morning with a full glass of water, on an empty stomach, and do not take anything else by mouth or lie down for the next 30 min.  3 tablet  3  . levETIRAcetam (KEPPRA) 500 MG tablet Take 1 tablet (500 mg total) by mouth 2 (two) times daily.  60 tablet  11  . loperamide (IMODIUM A-D) 2 MG tablet Take 2 mg by mouth 3 (three) times daily as needed. For diarrhea      . potassium chloride (K-DUR,KLOR-CON) 10 MEQ tablet Take 1 tablet (10 mEq total) by mouth daily.  90 tablet  3  . ranitidine (ZANTAC) 150 MG tablet Take 150 mg by mouth daily.       . rosuvastatin (CRESTOR) 20 MG tablet Take 1 tablet (20 mg total) by mouth daily.  90 tablet  3  . sodium chloride (OCEAN) 0.65 % nasal spray 3 sprays by Nasal route 3 (three) times daily.       Marland Kitchen tiZANidine (ZANAFLEX) 4 MG tablet Take 1 tablet (4 mg total) by mouth every 6 (six) hours as needed.  30 tablet  0  . traMADol (ULTRAM) 50 MG tablet Take 1 tablet (50 mg total) by mouth every 8 (eight) hours as needed for pain.  30 tablet  0  . [DISCONTINUED] potassium chloride (KLOR-CON) 10 MEQ CR tablet Take 1 tablet (10 mEq total) by mouth daily.  30 tablet  11     Dg Chest Port 1 View  01/28/2013   *RADIOLOGY REPORT*  Clinical Data: Tachycardia, left chest pain  PORTABLE CHEST - 1 VIEW  Comparison: Prior radiograph 04/29/2012  Findings: The cardiac and mediastinal silhouettes are stable in size and contour, and  remain within normal limits.  Lungs are normally inflated.  No airspace consolidation, pleural effusion, or pulmonary edema identified.  Linear opacity overlying the left heart border may reflect atelectasis / scarring within the lingula, unchanged.  There is no pneumothorax.  No acute osseous abnormality identified.  Degenerative spurring is noted about the right AC joint.  IMPRESSION: No acute cardiopulmonary process.   Original Report Authenticated By: Rise Mu, M.D.    Review of Systems  Constitutional: Negative.  Negative for fever and chills.  Eyes: Negative.   Respiratory: Negative.   Cardiovascular: Positive for palpitations.  Gastrointestinal: Negative.   Genitourinary: Negative.  Musculoskeletal: Negative.   Skin: Negative.   Neurological: Negative.   Endo/Heme/Allergies: Negative.   Psychiatric/Behavioral: Negative.   All other systems reviewed and are negative.   Blood pressure 82/47, pulse 33, temperature 98 F (36.7 C), temperature source Oral, resp. rate 15, SpO2 97.00%. Physical Exam  Constitutional: She is oriented to person, place, and time. No distress.  Neck: JVD present.  Cardiovascular: Normal rate and regular rhythm.   No murmur heard. Respiratory: Effort normal and breath sounds normal.  GI: Soft.  Musculoskeletal: She exhibits edema.  Neurological: She is alert and oriented to person, place, and time.  Skin: She is not diaphoretic.    ECG- now in sinus rhythm  Assessment/Plan: Misty Meyer is a pleasant 72 year old female with a history of PAF, and a seizure disorder. She presents tonight with atrial fibrillation with RVR. She is also hypokalemia and has a slight AKI. The AKI and hypokalemia could be from the recent start of the HCTZ. The hypokalemia in turn could have triggered the episodes of AF. At this time she is back in sinus rhythm on cardizem drip. I recommend replacing her potassium to a level above 4.0, restarting her multaq and  discontinuing the HCTZ for now. If she continues to have AF despite correction of her potassium, an alternative to the multaq would be needed. She should get an echocardiogram as well to re-assess for structural disease to help with the choice of a different agent if needed. Lastly, we will discuss systemic anticoagulation with her.    AF w/rvr- resolved. Possibly related to hypokalemia. Would stop cardizem once potassium is normalized. Continue multaq, tele. TTE  Hypokalemia- continue to replace to >4.0. Would stop HCTZ.    Anne Ng C 01/28/2013, 6:58 AM

## 2013-01-28 NOTE — Progress Notes (Signed)
UR Completed.  Misty Meyer [phone] 01/28/2013

## 2013-01-29 DIAGNOSIS — G40909 Epilepsy, unspecified, not intractable, without status epilepticus: Secondary | ICD-10-CM

## 2013-01-29 LAB — CBC
Hemoglobin: 11.2 g/dL — ABNORMAL LOW (ref 12.0–15.0)
MCH: 31.3 pg (ref 26.0–34.0)
MCHC: 33.2 g/dL (ref 30.0–36.0)
MCV: 94.1 fL (ref 78.0–100.0)
Platelets: 165 10*3/uL (ref 150–400)

## 2013-01-29 LAB — TROPONIN I: Troponin I: <0.3 ng/mL (ref <0.30)

## 2013-01-29 LAB — BASIC METABOLIC PANEL
Calcium: 8.1 mg/dL — ABNORMAL LOW (ref 8.4–10.5)
Creatinine, Ser: 1.22 mg/dL — ABNORMAL HIGH (ref 0.50–1.10)
GFR calc non Af Amer: 44 mL/min — ABNORMAL LOW (ref >90)
Glucose, Bld: 92 mg/dL (ref 70–99)
Sodium: 139 mEq/L (ref 135–145)

## 2013-01-29 MED ORDER — SPIRONOLACTONE 25 MG PO TABS: 12.5000 mg | ORAL_TABLET | Freq: Every day | ORAL | Status: DC

## 2013-01-29 MED ORDER — SPIRONOLACTONE 25 MG PO TABS: 12.5000 mg | ORAL_TABLET | Freq: Every day | ORAL | Status: AC

## 2013-01-29 NOTE — Progress Notes (Signed)
Pt discharged home per MD order. All discharge instructions were reviewed and all questions answered.  

## 2013-01-29 NOTE — Discharge Summary (Signed)
Physician Discharge Summary  Misty Meyer OZH:[id number] DOB: 12-Oct-1940 DOA: 01/28/2013  PCP: Rene Paci, MD  Admit date: 01/28/2013 Discharge date: 01/29/2013  Time spent: >45 minutes  Recommendations for Outpatient Follow-up:  1. Bmet Friday at [facility] cardiology  Discharge Diagnoses:  Active Problems:   PAF (paroxysmal atrial fibrillation)   Hypokalemia   Chronic ischemic heart disease, unspecified   Dyslipidemia   IBS (irritable bowel syndrome)   GERD (gastroesophageal reflux disease)   Major depressive disorder   Discharge Condition: stable  Diet recommendation: heart healthy  Filed Weights   01/28/13 0721  Weight: 71 kg (156 lb 8.4 oz)    History of present illness:  This is a 72 year old female, with known history of non-obstructive CAD, per cardiac cath 2010, HTN, paroxysmal atrial fibrillation, dyslipidemia, IBS, GERD, diverticulosis, major depressive disorder, allergic rhinitis, OA, osteoporosis. According to patient, she was quite fine, until close to midnight on 01/25/13, when while in bed, she suddenly developed palpitations, without chest pain or SOB. She called 911, and when EMS arrived, she was found to have a HR of about 78/min. EMS went away. She remained asymptomatic thereafter, until she went to bed at about 11:30 PM on 01/27/13. This time, she felt retrosternal chest pressure, and left upper arm discomfort, without SOB. She once again, called 911. When EMS arrived, HR was found to be in the 150s-190s. She was brought to the ED, where telemetry and EKG revealed fast atrial fibrillation. Patient was administered inv Cardizem bolus x 2 and commenced on IV Cardizem drip, with reversion to NSR.    Hospital Course:   PAF (paroxysmal atrial fibrillation): Patient presented with recurrent episodes of palpitations since 01/25/13, culminating in chest discomfort on 01/27/13. She was found to be in rapid atrial fibrillation, with HR in the 150s-190s. Fortunately she  quickly reverted to NSR, with administration iv Cardizem. CXR was devoid of acute findings, and patient has no clinical features of CHF. She has had no recurrence of chest discomfort, and initial Troponin is negative.  Patient is reportedly, not considered a candidate for anticoagulation given fall/injury risk with seizure disorder, although she has a CHADSVASC = 3.   2. Hypokalemia: Serum potassium was significantly low at 2.7, on presentation. According to patient she was started on HCTZ about A week prior, which is the likely culprit. Diuretics have been discontinued, and patient has already been administered potasium supplements in the ED. Likely, hypokalemia was contributory to A. Fib. Fortunately magnesium is normal at 2.3.  HCTZ has been discontinued and cardio has recommended Spironolactone 12.5 daily which has been prescribed.  3. HTN: Controlled.   4. CAD: Known history of non-obstructive CAD, per cardiac cath 2010. Clinically, no evidence of ACS at this time.   5. Dyslipidemia: On statin, which has been continued- lipid profile normal  6. IBS (irritable bowel syndrome)/GERD (gastroesophageal reflux disease): Stable and not problematic.   7. Major depressive disorder: Stable on pre-admission psychotropics.   8. Seizure disorder: Patient has been asymptomatic from this view point, for 2 years. Continued on pre-admission anticonvulsants.   Consultations:  Cardio  Discharge Exam: Filed Vitals:   01/29/13 1204  BP:   Pulse:   Temp: 97.7 F (36.5 C)  Resp:     General: AAO x 3, no distress Cardiovascular: RRR Respiratory: CTA b/l  Discharge Instructions      Discharge Orders   Future Appointments Provider Department Dept Phone   02/03/2013 9:25 AM [facility] Main Office [employer])  [phone]   Future Orders Complete By Expires   Diet - low sodium heart healthy  As directed    Increase activity slowly  As directed        Medication List          acetaminophen 500 MG tablet  Commonly known as:  TYLENOL  Take 500 mg by mouth every 6 (six) hours as needed for pain.     albuterol 108 (90 BASE) MCG/ACT inhaler  Commonly known as:  PROAIR HFA  Inhale 2 puffs into the lungs every 4 (four) hours as needed for wheezing.     aspirin 325 MG tablet  Take 325 mg by mouth daily.     beclomethasone 40 MCG/ACT inhaler  Commonly known as:  QVAR  Inhale 2 puffs into the lungs 2 (two) times daily.     diclofenac 75 MG EC tablet  Commonly known as:  VOLTAREN  Take 75 mg by mouth 2 (two) times daily.     dronedarone 400 MG tablet  Commonly known as:  MULTAQ  Take 1 tablet (400 mg total) by mouth 2 (two) times daily with a meal.     fluticasone 50 MCG/ACT nasal spray  Commonly known as:  FLONASE  Place 2 sprays into the nose 2 (two) times daily.     ibandronate 150 MG tablet  Commonly known as:  BONIVA  Take 1 tablet (150 mg total) by mouth every 30 (thirty) days. Take in the morning with a full glass of water, on an empty stomach, and do not take anything else by mouth or lie down for the next 30 min.     IMODIUM A-D 2 MG tablet  Generic drug:  loperamide  Take 2 mg by mouth 3 (three) times daily as needed. For diarrhea     levETIRAcetam 500 MG tablet  Commonly known as:  KEPPRA  Take 1 tablet (500 mg total) by mouth 2 (two) times daily.     metoprolol succinate 50 MG 24 hr tablet  Commonly known as:  TOPROL-XL  Take 50 mg by mouth daily. Take with or immediately following a meal.     nitroGLYCERIN 0.4 MG SL tablet  Commonly known as:  NITROSTAT  Place 0.4 mg under the tongue every 5 (five) minutes as needed for chest pain.     PHENobarbital 64.8 MG tablet  Commonly known as:  LUMINAL  Take 64.8 mg by mouth 2 (two) times daily.     potassium chloride 10 MEQ tablet  Commonly known as:  K-DUR,KLOR-CON  Take 1 tablet (10 mEq total) by mouth daily.     ranitidine 150 MG tablet  Commonly known as:  ZANTAC  Take 150 mg by  mouth daily.     rosuvastatin 20 MG tablet  Commonly known as:  CRESTOR  Take 1 tablet (20 mg total) by mouth daily.     sodium chloride 0.65 % nasal spray  Commonly known as:  OCEAN  3 sprays by Nasal route 3 (three) times daily.     spironolactone 25 MG tablet  Commonly known as:  ALDACTONE  Take 0.5 tablets (12.5 mg total) by mouth daily.     tiZANidine 4 MG tablet  Commonly known as:  ZANAFLEX  Take 1 tablet (4 mg total) by mouth every 6 (six) hours as needed.     traMADol 50 MG tablet  Commonly known as:  ULTRAM  Take 1 tablet (50 mg total) by mouth every 8 (eight) hours as needed  for pain.       Allergies  Allergen Reactions  . Codeine Other (See Comments)    Caused possible hallucinations and anxiety  . Latex Rash  . Neomycin-Bacitracin Zn-Polymyx Itching and Rash  . Penicillins Swelling    Lips      The results of significant diagnostics from this hospitalization (including imaging, microbiology, ancillary and laboratory) are listed below for reference.    Significant Diagnostic Studies: Dg Chest Port 1 View  01/28/2013   *RADIOLOGY REPORT*  Clinical Data: Tachycardia, left chest pain  PORTABLE CHEST - 1 VIEW  Comparison: Prior radiograph 04/29/2012  Findings: The cardiac and mediastinal silhouettes are stable in size and contour, and remain within normal limits.  Lungs are normally inflated.  No airspace consolidation, pleural effusion, or pulmonary edema identified.  Linear opacity overlying the left heart border may reflect atelectasis / scarring within the lingula, unchanged.  There is no pneumothorax.  No acute osseous abnormality identified.  Degenerative spurring is noted about the right AC joint.  IMPRESSION: No acute cardiopulmonary process.   Original Report Authenticated By: Rise Mu, M.D.    Microbiology: Recent Results (from the past 240 hour(s))  MRSA PCR SCREENING     Status: None   Collection Time    01/28/13  8:06 AM      Result  Value Range Status   MRSA by PCR NEGATIVE  NEGATIVE Final   Comment:            The GeneXpert MRSA Assay (FDA     approved for NASAL specimens     only), is one component of a     comprehensive MRSA colonization     surveillance program. It is not     intended to diagnose MRSA     infection nor to guide or     monitor treatment for     MRSA infections.     Labs: Basic Metabolic Panel:  Recent Labs Lab 01/28/13 0406 01/28/13 0419 01/28/13 0839 01/28/13 1535 01/29/13 0345  NA 139  --   --  141 139  K 2.7*  --   --  3.9 4.2  CL 104  --   --  108 108  CO2  --   --   --  25 23  GLUCOSE 102*  --   --  103* 92  BUN 27*  --   --  26* 26*  CREATININE 1.40*  --  1.13* 1.24* 1.22*  CALCIUM  --   --   --  7.9* 8.1*  MG  --  2.3  --   --   --    Liver Function Tests: No results found for this basename: AST, ALT, ALKPHOS, BILITOT, PROT, ALBUMIN,  in the last 168 hours No results found for this basename: LIPASE, AMYLASE,  in the last 168 hours No results found for this basename: AMMONIA,  in the last 168 hours CBC:  Recent Labs Lab 01/28/13 0355 01/28/13 0406 01/28/13 0839 01/29/13 0345  WBC 6.8  --  7.9 7.1  NEUTROABS 4.0  --   --   --   HGB 12.4 12.2 12.2 11.2*  HCT 36.0 36.0 35.7* 33.7*  MCV 92.3  --  93.2 94.1  PLT 172  --  172 165   Cardiac Enzymes:  Recent Labs Lab 01/28/13 1015 01/28/13 1751 01/28/13 2320  TROPONINI <0.30 <0.30 <0.30   BNP: BNP (last 3 results) No results found for this basename: PROBNP,  in the last 8760  hours CBG: No results found for this basename: GLUCAP,  in the last 168 hours     Signed:  Mayana Irigoyen  [facility] 01/29/2013, 12:10 PM

## 2013-01-29 NOTE — Progress Notes (Signed)
Subjective:   No further AF overnight. Feels fine. K 4.2/ echo results pending   Tele: SR   Intake/Output Summary (Last 24 hours) at 01/29/13 0942 Last data filed at 01/29/13 0405  Gross per 24 hour  Intake    600 ml  Output    850 ml  Net   -250 ml    Current meds: . aspirin EC  325 mg Oral Daily  . atorvastatin  40 mg Oral q1800  . beclomethasone  2 puff Inhalation BID  . diclofenac  75 mg Oral BID  . dronedarone  400 mg Oral BID WC  . famotidine  20 mg Oral Daily  . fluticasone  2 spray Each Nare BID  . heparin  5,000 Units Subcutaneous Q8H  . levETIRAcetam  500 mg Oral BID  . metoprolol succinate  50 mg Oral Daily  . PHENobarbital  64.8 mg Oral BID  . sodium chloride  3 spray Nasal TID  . sodium chloride  3 mL Intravenous Q12H   Infusions:     Objective:  Blood pressure 91/64, pulse 71, temperature 97.5 F (36.4 C), temperature source Oral, resp. rate 14, height 5\' 3"  (1.6 m), weight 71 kg (156 lb 8.4 oz), SpO2 97.00%. Weight change:   Physical Exam: General:  Well appearing. No resp difficulty HEENT: normal Neck: supple. JVP . Carotids 2+ bilat; no bruits. No lymphadenopathy or thryomegaly appreciated. Cor: PMI nondisplaced. Regular rate & rhythm. No rubs, gallops or murmurs. Lungs: clear Abdomen: soft, nontender, nondistended. No hepatosplenomegaly. No bruits or masses. Good bowel sounds. Extremities: no cyanosis, clubbing, rash, edema Neuro: alert & orientedx3, cranial nerves grossly intact. moves all 4 extremities w/o difficulty. Affect pleasant   Lab Results: Basic Metabolic Panel:  Recent Labs Lab 01/28/13 0406 01/28/13 0419 01/28/13 0839 01/28/13 1535 01/29/13 0345  NA 139  --   --  141 139  K 2.7*  --   --  3.9 4.2  CL 104  --   --  108 108  CO2  --   --   --  25 23  GLUCOSE 102*  --   --  103* 92  BUN 27*  --   --  26* 26*  CREATININE 1.40*  --  1.13* 1.24* 1.22*  CALCIUM  --   --   --  7.9* 8.1*  MG  --  2.3  --   --   --     Liver Function Tests: No results found for this basename: AST, ALT, ALKPHOS, BILITOT, PROT, ALBUMIN,  in the last 168 hours No results found for this basename: LIPASE, AMYLASE,  in the last 168 hours No results found for this basename: AMMONIA,  in the last 168 hours CBC:  Recent Labs Lab 01/28/13 0355 01/28/13 0406 01/28/13 0839 01/29/13 0345  WBC 6.8  --  7.9 7.1  NEUTROABS 4.0  --   --   --   HGB 12.4 12.2 12.2 11.2*  HCT 36.0 36.0 35.7* 33.7*  MCV 92.3  --  93.2 94.1  PLT 172  --  172 165   Cardiac Enzymes:  Recent Labs Lab 01/28/13 1015 01/28/13 1751 01/28/13 2320  TROPONINI <0.30 <0.30 <0.30   BNP: No components found with this basename: POCBNP,  CBG: No results found for this basename: GLUCAP,  in the last 168 hours Microbiology: Lab Results  Component Value Date   CULT Multiple bacterial morphotypes present, none predominant. Suggest appropriate recollection if clinically indicated. 03/22/2010   CULT NO  GROWTH 5 DAYS 07/21/2009   CULT NO GROWTH 5 DAYS 07/21/2009   CULT NO GROWTH 07/21/2009   No results found for this basename: CULT, SDES,  in the last 168 hours  Imaging: Dg Chest Port 1 View  01/28/2013   *RADIOLOGY REPORT*  Clinical Data: Tachycardia, left chest pain  PORTABLE CHEST - 1 VIEW  Comparison: Prior radiograph 04/29/2012  Findings: The cardiac and mediastinal silhouettes are stable in size and contour, and remain within normal limits.  Lungs are normally inflated.  No airspace consolidation, pleural effusion, or pulmonary edema identified.  Linear opacity overlying the left heart border may reflect atelectasis / scarring within the lingula, unchanged.  There is no pneumothorax.  No acute osseous abnormality identified.  Degenerative spurring is noted about the right AC joint.  IMPRESSION: No acute cardiopulmonary process.   Original Report Authenticated By: Rise Mu, M.D.     ASSESSMENT:  1. PAF on Multaq 2.  Hypokalemia  PLAN/DISCUSSION:  She is doing well. Can go home today on Multaq. Would stop HCTZ. Start spiro 12.5 daily for diuretic.   She has BMET on Friday at our office. I will arrange for 2 week event monitor to assess AF burden. If burden is high can consider switching Multaq. Not on coumadin due to sz do. Continue ASA. Can consider Eliquis.  Arvilla Meres, MD 01/29/2013, 9:42 AM

## 2013-01-31 ENCOUNTER — Telehealth: Payer: Self-pay | Admitting: General Practice

## 2013-01-31 NOTE — Telephone Encounter (Signed)
Transitional care call:  Patient dc'd from hospital on 8/31.  DC diagnosis: paroxysmal atrial fibrillation, Hypokalemia.  Spoke with patient.  Patient states that she is doing well.  Appetite is good and bowels are moving however, patient has IBS so stools are watery.  RN reviewed medications with patient ie aldactone.  RN explained that aldactone is a potassium sparing diueretic.  Patient states that all medications are in the home.  Patient lives along but has many people that check on her regularly.  Patient denies questions regarding hospital discharge instructions.  Patient has lab appointment for BMET with cardiologist this week.  Scheduled follow up appointment with Dr. Felicity Coyer for 02/06/13.

## 2013-02-02 ENCOUNTER — Other Ambulatory Visit: Payer: Self-pay | Admitting: *Deleted

## 2013-02-02 DIAGNOSIS — I4891 Unspecified atrial fibrillation: Secondary | ICD-10-CM

## 2013-02-03 ENCOUNTER — Other Ambulatory Visit: Payer: Medicare Other

## 2013-02-06 ENCOUNTER — Encounter (INDEPENDENT_AMBULATORY_CARE_PROVIDER_SITE_OTHER): Payer: Medicare Other

## 2013-02-06 ENCOUNTER — Telehealth: Payer: Self-pay | Admitting: Cardiology

## 2013-02-06 ENCOUNTER — Ambulatory Visit (INDEPENDENT_AMBULATORY_CARE_PROVIDER_SITE_OTHER): Payer: Medicare Other | Admitting: Internal Medicine

## 2013-02-06 ENCOUNTER — Encounter: Payer: Self-pay | Admitting: *Deleted

## 2013-02-06 ENCOUNTER — Other Ambulatory Visit (INDEPENDENT_AMBULATORY_CARE_PROVIDER_SITE_OTHER): Payer: Medicare Other

## 2013-02-06 ENCOUNTER — Encounter: Payer: Self-pay | Admitting: Internal Medicine

## 2013-02-06 VITALS — BP 130/72 | HR 106 | Temp 97.4°F | Wt 158.8 lb

## 2013-02-06 DIAGNOSIS — R42 Dizziness and giddiness: Secondary | ICD-10-CM

## 2013-02-06 DIAGNOSIS — G40909 Epilepsy, unspecified, not intractable, without status epilepticus: Secondary | ICD-10-CM

## 2013-02-06 DIAGNOSIS — E876 Hypokalemia: Secondary | ICD-10-CM

## 2013-02-06 DIAGNOSIS — I4891 Unspecified atrial fibrillation: Secondary | ICD-10-CM

## 2013-02-06 DIAGNOSIS — E785 Hyperlipidemia, unspecified: Secondary | ICD-10-CM

## 2013-02-06 LAB — BASIC METABOLIC PANEL
BUN: 31 mg/dL — ABNORMAL HIGH (ref 6–23)
Calcium: 9.2 mg/dL (ref 8.4–10.5)
GFR: 45.34 mL/min — ABNORMAL LOW (ref >60.00)
Glucose, Bld: 115 mg/dL — ABNORMAL HIGH (ref 70–99)
Potassium: 4.5 mEq/L (ref 3.5–5.1)

## 2013-02-06 NOTE — Patient Instructions (Signed)
It was good to see you today. We have reviewed your prior records including labs and tests today Medications reviewed and updated, no changes recommended at this time. Test(s) ordered today. Your results will be released to MyChart (or called to you) after review, usually within 72hours after test completion. If any changes need to be made, you will be notified at that same time. follow up with Dr Shirlee Latch as scheduled - may consider increasing Toprol if pulse remains >100 or symptomatic palpitations  Please schedule followup in 3-4 months, call sooner if problems.

## 2013-02-06 NOTE — Telephone Encounter (Signed)
New Problem  Pt is asking if the monitor is beeping is it sending her a message that something is wrong???// pt request clarification//

## 2013-02-06 NOTE — Progress Notes (Signed)
Patient ID: Misty Meyer, female   DOB: 07/31/1940, 72 y.o.   MRN: [id number] [employer] 30 day cardiac event monitor applied to patient.

## 2013-02-06 NOTE — Progress Notes (Signed)
  Subjective:    Patient ID: Misty Meyer, female    DOB: 11-Oct-1940, 72 y.o.   MRN: [id number]  HPI Here for hospital followup - 48 hour stay for symptomatic rapid A. Fib  Discharge summary and hospital testing/images reviewed: Admit date: 01/28/2013  Discharge date: 01/29/2013  Time spent: >45 minutes  Recommendations for Outpatient Follow-up:  1. Bmet Friday at [facility] cardiology Discharge Diagnoses:  Active Problems:  PAF (paroxysmal atrial fibrillation)  Hypokalemia  Chronic ischemic heart disease, unspecified  Dyslipidemia  IBS (irritable bowel syndrome)  GERD (gastroesophageal reflux disease)  Major depressive disorder   Since home, patient denies recurrent palpitations, shortness of breath or problems with medications  Past Medical History  Diagnosis Date  . CAD (coronary artery disease)     nonobst cath 12/2008  . HLD (hyperlipidemia)   . Calculus of gallbladder without mention of cholecystitis or obstruction 03/2011    s/p lap chole  . IBS (irritable bowel syndrome)   . GERD (gastroesophageal reflux disease)   . Tinnitus   . Seizure disorder     Grand mal seizure 7/12, [facility]  . Anemia   . Allergic rhinitis, cause unspecified   . Major depressive disorder, single episode, severe, without mention of psychotic behavior   . Arthritis of back   . Atrial fibrillation     on Multaq  . Asthma   . Osteoporosis     DEXA 04/2011: -3.1 L fem, on Boniva  . Diverticulosis   . Seizures    Review of Systems  Constitutional: Negative for fever and fatigue.  Respiratory: Negative for cough and shortness of breath.   Cardiovascular: Negative for chest pain and palpitations.       Objective:   Physical Exam BP 130/72  Pulse 106  Temp(Src) 97.4 F (36.3 C) (Oral)  Wt 158 lb 12.8 oz (72.031 kg)  BMI 28.14 kg/m2  SpO2 98% Wt Readings from Last 3 Encounters:  02/06/13 158 lb 12.8 oz (72.031 kg)  01/28/13 156 lb 8.4 oz (71 kg)  01/19/13 159 lb (72.122 kg)    Constitutional: She appears well-developed and well-nourished. No distress.  Neck: Normal range of motion. Neck supple. No JVD present. No thyromegaly present.  Cardiovascular: Normal rate, irregular rhythm and normal heart sounds.  No murmur heard. No BLE edema. Pulmonary/Chest: Effort normal and breath sounds normal. No respiratory distress. She has no wheezes.  Skin: Skin is warm and dry. No rash noted. No erythema.  Psychiatric: She has a normal mood and affect. Her behavior is normal. Judgment and thought content normal.   Lab Results  Component Value Date   WBC 7.1 01/29/2013   HGB 11.2* 01/29/2013   HCT 33.7* 01/29/2013   PLT 165 01/29/2013   GLUCOSE 92 01/29/2013   CHOL 118 01/28/2013   TRIG 83 01/28/2013   HDL 45 01/28/2013   LDLDIRECT 137.0 10/23/2008   LDLCALC 56 01/28/2013   ALT 29 12/28/2011   AST 23 12/28/2011   NA 139 01/29/2013   K 4.2 01/29/2013   CL 108 01/29/2013   CREATININE 1.22* 01/29/2013   BUN 26* 01/29/2013   CO2 23 01/29/2013   TSH 0.176* 01/28/2013   INR 1.03 01/28/2013   HGBA1C 5.7 02/09/2012       Assessment & Plan:   Hospital follow up within 14 days of discharge Contact was made within 48h of discharge to arrange follow up  See problem list. Medications and labs reviewed today.

## 2013-02-06 NOTE — Assessment & Plan Note (Signed)
On Multaq > maintaining normal sinus until symptomatic AF w/ RVR prompting hosp 12/2012 occassionally symptomatic palpitations at times (but overall improved since DC home) On Toprol XR -?increase dose for HR>100 On aspirin therapy, as pt high-risk for Coumadin because of seizure disorder Follows with cardiology for same - upcoming OV reviewed,  on LifeWatch monitor starting today for next month

## 2013-02-06 NOTE — Telephone Encounter (Signed)
Spoke with patient. She states monitor no longer beeping,she will call toll free number if that happens again. She feels fine and does not feel her heart rate is fast at this time.

## 2013-02-06 NOTE — Assessment & Plan Note (Signed)
Likely related to HCTZ - same stopped and changes to spironolactone 01/29/13 in hosp for AF w/ RVR No longer on Kdur either Check Bmet now - adjust as needed

## 2013-02-06 NOTE — Assessment & Plan Note (Signed)
On crestor - last lipids reviewed, at goal The current medical regimen is effective;  continue present plan and medications.

## 2013-02-06 NOTE — Assessment & Plan Note (Signed)
Long history of petit mal seizures> episode of grand mal event June 2012 Follows with neurology Dr. Anne Hahn for same Medications adjusted following grand mal seizure> no recurrent breakthrough seizures since that time Continue weighing the risk benefit of potential anticoag for AF with risk of recurrent seizure

## 2013-02-17 ENCOUNTER — Encounter (HOSPITAL_COMMUNITY): Payer: Self-pay | Admitting: Emergency Medicine

## 2013-02-17 ENCOUNTER — Emergency Department (HOSPITAL_COMMUNITY)
Admission: EM | Admit: 2013-02-17 | Discharge: 2013-02-17 | Disposition: A | Discharge status: Home / Self Care | Payer: Medicare Other | Attending: Emergency Medicine | Admitting: Emergency Medicine

## 2013-02-17 ENCOUNTER — Emergency Department (HOSPITAL_COMMUNITY): Payer: Medicare Other

## 2013-02-17 DIAGNOSIS — Z8659 Personal history of other mental and behavioral disorders: Secondary | ICD-10-CM | POA: Insufficient documentation

## 2013-02-17 DIAGNOSIS — IMO0002 Reserved for concepts with insufficient information to code with codable children: Secondary | ICD-10-CM | POA: Insufficient documentation

## 2013-02-17 DIAGNOSIS — Z862 Personal history of diseases of the blood and blood-forming organs and certain disorders involving the immune mechanism: Secondary | ICD-10-CM | POA: Insufficient documentation

## 2013-02-17 DIAGNOSIS — Z8669 Personal history of other diseases of the nervous system and sense organs: Secondary | ICD-10-CM | POA: Insufficient documentation

## 2013-02-17 DIAGNOSIS — I2510 Atherosclerotic heart disease of native coronary artery without angina pectoris: Secondary | ICD-10-CM | POA: Insufficient documentation

## 2013-02-17 DIAGNOSIS — I259 Chronic ischemic heart disease, unspecified: Secondary | ICD-10-CM

## 2013-02-17 DIAGNOSIS — Z8719 Personal history of other diseases of the digestive system: Secondary | ICD-10-CM | POA: Insufficient documentation

## 2013-02-17 DIAGNOSIS — Z7982 Long term (current) use of aspirin: Secondary | ICD-10-CM | POA: Insufficient documentation

## 2013-02-17 DIAGNOSIS — I4891 Unspecified atrial fibrillation: Secondary | ICD-10-CM | POA: Insufficient documentation

## 2013-02-17 DIAGNOSIS — G40401 Other generalized epilepsy and epileptic syndromes, not intractable, with status epilepticus: Secondary | ICD-10-CM | POA: Insufficient documentation

## 2013-02-17 DIAGNOSIS — M129 Arthropathy, unspecified: Secondary | ICD-10-CM | POA: Insufficient documentation

## 2013-02-17 DIAGNOSIS — J45909 Unspecified asthma, uncomplicated: Secondary | ICD-10-CM | POA: Insufficient documentation

## 2013-02-17 DIAGNOSIS — Z79899 Other long term (current) drug therapy: Secondary | ICD-10-CM | POA: Insufficient documentation

## 2013-02-17 DIAGNOSIS — Z880 Allergy status to penicillin: Secondary | ICD-10-CM | POA: Insufficient documentation

## 2013-02-17 DIAGNOSIS — Z91040 Latex allergy status: Secondary | ICD-10-CM | POA: Insufficient documentation

## 2013-02-17 DIAGNOSIS — E785 Hyperlipidemia, unspecified: Secondary | ICD-10-CM | POA: Insufficient documentation

## 2013-02-17 LAB — POCT I-STAT, CHEM 8
Chloride: 108 mEq/L (ref 96–112)
Glucose, Bld: 84 mg/dL (ref 70–99)
HCT: 37 % (ref 36.0–46.0)
Hemoglobin: 12.6 g/dL (ref 12.0–15.0)
Potassium: 3.9 mEq/L (ref 3.5–5.1)
Sodium: 141 mEq/L (ref 135–145)

## 2013-02-17 LAB — CBC
HCT: 36.8 % (ref 36.0–46.0)
Hemoglobin: 12.2 g/dL (ref 12.0–15.0)
MCH: 31.2 pg (ref 26.0–34.0)
MCHC: 33.2 g/dL (ref 30.0–36.0)
MCV: 94.1 fL (ref 78.0–100.0)
Platelets: 171 10*3/uL (ref 150–400)
RBC: 3.91 MIL/uL (ref 3.87–5.11)
RDW: 12.7 % (ref 11.5–15.5)
WBC: 8.4 10*3/uL (ref 4.0–10.5)

## 2013-02-17 LAB — POCT I-STAT TROPONIN I: Troponin i, poc: 0 ng/mL (ref 0.00–0.08)

## 2013-02-17 LAB — TSH: TSH: 0.166 u[IU]/mL — ABNORMAL LOW (ref 0.350–4.500)

## 2013-02-17 MED ORDER — DILTIAZEM HCL 100 MG IV SOLR
5.0000 mg/h | INTRAVENOUS | Status: DC
  Administered 2013-02-17: 5 mg/h via INTRAVENOUS
  Filled 2013-02-17: qty 100

## 2013-02-17 MED ORDER — METOPROLOL SUCCINATE ER 50 MG PO TB24: 50.0000 mg | ORAL_TABLET | Freq: Every day | ORAL | Status: DC

## 2013-02-17 MED ORDER — SODIUM CHLORIDE 0.9 % IV BOLUS (SEPSIS)
500.0000 mL | Freq: Once | INTRAVENOUS | Status: AC
  Administered 2013-02-17: 500 mL via INTRAVENOUS

## 2013-02-17 MED ORDER — METOPROLOL SUCCINATE ER 50 MG PO TB24
50.0000 mg | ORAL_TABLET | Freq: Every day | ORAL | Status: DC
  Administered 2013-02-17: 50 mg via ORAL
  Filled 2013-02-17: qty 1

## 2013-02-17 MED ORDER — METOPROLOL SUCCINATE ER 100 MG PO TB24
100.0000 mg | ORAL_TABLET | Freq: Every day | ORAL | Status: DC
  Filled 2013-02-17 (×2): qty 1

## 2013-02-17 NOTE — ED Notes (Addendum)
Misty Demark, MD called to inquire after the Metoprolol. This RN told her that the pt's blood pressures have been low, and this RN was told to hold the metoprolol until she could talk to the cardiologist.

## 2013-02-17 NOTE — ED Notes (Signed)
Reverted to SR when doing [place].Cardizem drip stopped.EKG done.

## 2013-02-17 NOTE — ED Notes (Signed)
Spoke with Delton See, MD, and she said that she would speak to Bernette Mayers, MD to get the pt's discharge instructions in order.

## 2013-02-17 NOTE — Progress Notes (Signed)
Events and data reviewed with MD. Pt BP too low to increase her home BB or add Dilt. Will continue current medications. F/u with Dr. Shirlee Latch as scheduled. No additional anticoag with her hx of seizures, increased risk of falls.

## 2013-02-17 NOTE — ED Provider Notes (Signed)
CSN: [id number]     Arrival date & time 02/17/13  0431 History   First MD Initiated Contact with Patient 02/17/13 [phone]     Chief Complaint  Patient presents with  . Atrial Fibrillation   (Consider location/radiation/quality/duration/timing/severity/associated sxs/prior Treatment) HPI History provided by patient. Has history of seizure disorder, heart disease and atrial fibrillation. Patient takes Multaq and is not anticoagulated, followed by the [facility] cardiology.  Tonight after a long day at work she went home and took a nap around 5 PM, woke at 9:30 PM and then took her night medications (later than she typically does). Shortly after she developed palpitations that feel like her rapid atrial fibrillation. Symptoms not resolving and presents here for evaluation. Per EMS heart rate between 110 and 170 on the monitor showing A. Fib.  No chest pain. No shortness of breath. No near-syncope. Symptoms moderate in severity. Past Medical History  Diagnosis Date  . CAD (coronary artery disease)     nonobst cath 12/2008  . HLD (hyperlipidemia)   . Calculus of gallbladder without mention of cholecystitis or obstruction 03/2011    s/p lap chole  . IBS (irritable bowel syndrome)   . GERD (gastroesophageal reflux disease)   . Tinnitus   . Seizure disorder     Grand mal seizure 7/12, [facility]  . Anemia   . Allergic rhinitis, cause unspecified   . Major depressive disorder, single episode, severe, without mention of psychotic behavior   . Arthritis of back   . Atrial fibrillation     on Multaq  . Asthma   . Osteoporosis     DEXA 04/2011: -3.1 L fem, on Boniva  . Diverticulosis   . Seizures    Past Surgical History  Procedure Laterality Date  . Total abdominal hysterectomy    . Wrist fracture surgery      with plates (left)  . Tonsillectomy      age 89  . Cholecystectomy  03/25/2011   Family History  Problem Relation Age of Onset  . Angina Mother   . Alzheimer's disease Mother   .  Heart attack Father   . Heart failure Father   . Heart disease Father   . Heart disease Brother   . Breast cancer Maternal Aunt   . Cancer Maternal Aunt     breast  . Cervical cancer Maternal Aunt   . Cancer Maternal Aunt     ovarian  . Colon cancer Neg Hx    History  Substance Use Topics  . Smoking status: Never Smoker   . Smokeless tobacco: Never Used  . Alcohol Use: No     Comment: quit 2012   OB History   Grav Para Term Preterm Abortions TAB SAB Ect Mult Living                 Review of Systems  Constitutional: Negative for fever and chills.  HENT: Negative for neck pain and neck stiffness.   Eyes: Negative for pain.  Respiratory: Negative for shortness of breath.   Cardiovascular: Positive for palpitations. Negative for chest pain.  Gastrointestinal: Negative for abdominal pain.  Genitourinary: Negative for dysuria.  Musculoskeletal: Negative for back pain.  Skin: Negative for rash.  Neurological: Negative for headaches.  All other systems reviewed and are negative.    Allergies  Codeine; Latex; Neomycin-bacitracin zn-polymyx; and Penicillins  Home Medications   Current Outpatient Rx  Name  Route  Sig  Dispense  Refill  . acetaminophen (TYLENOL) 500  MG tablet   Oral   Take 500 mg by mouth every 6 (six) hours as needed for pain.          Marland Kitchen albuterol (PROAIR HFA) 108 (90 BASE) MCG/ACT inhaler   Inhalation   Inhale 2 puffs into the lungs every 4 (four) hours as needed for wheezing.   3 Inhaler   3   . aspirin 325 MG tablet   Oral   Take 325 mg by mouth daily.           . beclomethasone (QVAR) 40 MCG/ACT inhaler   Inhalation   Inhale 2 puffs into the lungs 2 (two) times daily.   8.7 g   11   . diclofenac (VOLTAREN) 75 MG EC tablet   Oral   Take 75 mg by mouth 2 (two) times daily.         Marland Kitchen dronedarone (MULTAQ) 400 MG tablet   Oral   Take 1 tablet (400 mg total) by mouth 2 (two) times daily with a meal.   180 tablet   3   . fluticasone  (FLONASE) 50 MCG/ACT nasal spray   Nasal   Place 2 sprays into the nose 2 (two) times daily.         Marland Kitchen ibandronate (BONIVA) 150 MG tablet   Oral   Take 1 tablet (150 mg total) by mouth every 30 (thirty) days. Take in the morning with a full glass of water, on an empty stomach, and do not take anything else by mouth or lie down for the next 30 min.   3 tablet   3   . levETIRAcetam (KEPPRA) 500 MG tablet   Oral   Take 1 tablet (500 mg total) by mouth 2 (two) times daily.   60 tablet   11   . loperamide (IMODIUM A-D) 2 MG tablet   Oral   Take 2 mg by mouth 3 (three) times daily as needed. For diarrhea         . metoprolol succinate (TOPROL-XL) 50 MG 24 hr tablet   Oral   Take 50 mg by mouth daily. Take with or immediately following a meal.         . nitroGLYCERIN (NITROSTAT) 0.4 MG SL tablet   Sublingual   Place 0.4 mg under the tongue every 5 (five) minutes as needed for chest pain.         Marland Kitchen PHENobarbital (LUMINAL) 64.8 MG tablet   Oral   Take 64.8 mg by mouth 2 (two) times daily.         . ranitidine (ZANTAC) 150 MG tablet   Oral   Take 150 mg by mouth daily.          . rosuvastatin (CRESTOR) 20 MG tablet   Oral   Take 1 tablet (20 mg total) by mouth daily.   90 tablet   3   . sodium chloride (OCEAN) 0.65 % nasal spray   Nasal   3 sprays by Nasal route 3 (three) times daily.          Marland Kitchen spironolactone (ALDACTONE) 25 MG tablet   Oral   Take 0.5 tablets (12.5 mg total) by mouth daily.   30 tablet   0   . tiZANidine (ZANAFLEX) 4 MG tablet   Oral   Take 1 tablet (4 mg total) by mouth every 6 (six) hours as needed.   30 tablet   0   . traMADol (ULTRAM) 50 MG tablet   Oral  Take 1 tablet (50 mg total) by mouth every 8 (eight) hours as needed for pain.   30 tablet   0    BP 136/77  Pulse 146  Temp(Src) 97.8 F (36.6 C) (Oral)  Resp 12  SpO2 98% Physical Exam  Constitutional: She is oriented to person, place, and time. She appears  well-developed and well-nourished.  HENT:  Head: Normocephalic and atraumatic.  Eyes: EOM are normal. Pupils are equal, round, and reactive to light.  Neck: Neck supple.  Cardiovascular: Intact distal pulses.   Rapid and irregular  Pulmonary/Chest: Effort normal and breath sounds normal. No respiratory distress. She has no rales.  Abdominal: Soft. She exhibits no distension. There is no tenderness.  Musculoskeletal: Normal range of motion. She exhibits no edema and no tenderness.  Neurological: She is alert and oriented to person, place, and time.  Skin: Skin is warm and dry.    ED Course  Procedures (including critical care time) Labs Review Labs Reviewed  POCT I-STAT, CHEM 8 - Abnormal; Notable for the following:    BUN 38 (*)    Creatinine, Ser 1.50 (*)    All other components within normal limits  CBC  POCT I-STAT TROPONIN I   Imaging Review Dg Chest Portable 1 View  02/17/2013   *RADIOLOGY REPORT*  Clinical Data: Atrial fibrillation  PORTABLE CHEST - 1 VIEW  Comparison: Prior radiograph from 01/28/2013  Findings: Electrodes overlie the thorax.  Cardiac and mediastinal silhouettes are stable in size and contour, and remain within normal limits.  Lungs are normally inflated.  No airspace consolidation, pleural effusion, or pulmonary edema is identified.  There is no pneumothorax.  Bony thorax is intact.  IMPRESSION: No radiographic evidence of acute cardiopulmonary process.   Original Report Authenticated By: Rise Mu, M.D.     Date: 02/17/2013  Rate: 141  Rhythm: atrial fibrillation  QRS Axis: normal  Intervals: normal  ST/T Wave abnormalities: nonspecific ST changes  Conduction Disutrbances:none  Narrative Interpretation:   Old EKG Reviewed: unchanged prior EKG dated 01/28/2013 demonstrates rapid A. fib rate 145  CRITICAL CARE Performed by: Sunnie Nielsen Total critical care time: 30 Critical care time was exclusive of separately billable procedures and  treating other patients. Critical care was necessary to treat or prevent imminent or life-threatening deterioration. Critical care was time spent personally by me on the following activities: development of treatment plan with patient and/or surrogate as well as nursing, discussions with consultants, evaluation of patient's response to treatment, examination of patient, obtaining history from patient or surrogate, ordering and performing treatments and interventions, ordering and review of laboratory studies, ordering and review of radiographic studies, pulse oximetry and re-evaluation of patient's condition. IV Cardizem drip initiated for rate control  7:30 AM discussed with cardiology, will evaluate bedside MDM  Diagnosis: Rapid A. fib with RVR  ECG, CXR, labs Titration of Cardizem drip for rate control Cardiology consult  Sunnie Nielsen, MD 02/17/13 [phone]

## 2013-02-17 NOTE — ED Notes (Signed)
Spoke with [facility] cardiology, and was told that the pt could take her Metoprolol at home is she wished, and that someone would work on discharge papers for her shortly.

## 2013-02-17 NOTE — ED Provider Notes (Signed)
Pt seen by Corinda [facility]ler Cardiologist earlier today and held in the ED for several hours until BP was sufficient to take Metoprolol and discharge home. Pt would like to eat dinner before going home so she can take her Multaq when she gets home.   Charles B. Bernette Mayers, MD 02/17/13 [phone]

## 2013-02-17 NOTE — ED Provider Notes (Signed)
Spoke with Bjorn Loser, [place] for cardiology.  Patient has been off diltiazem drip and monitored this afternoon.  Soft blood pressures without symptoms.  At this time, no adjustment to be made to toprol.  Will follow-up with cardiology as scheduled.  Patient to be discharged.  Shon Baton, MD 02/17/13 [phone]

## 2013-02-17 NOTE — ED Notes (Signed)
Portable CXR at bedside.

## 2013-02-17 NOTE — ED Notes (Signed)
Spoke with Barrett, MD. Told to give the Metoprolol, and hold if systolic blood pressure is below 100.

## 2013-02-17 NOTE — ED Notes (Signed)
PT HAS ARRIVED TO POD C TO AWAIT CARDIOLOGY CONSULT. SHE DENIES CP OR SOB. SINUS RHYTHM ON MONITOR. LUNCH SET UP FOR PT TO EAT.

## 2013-02-17 NOTE — Consult Note (Signed)
CARDIOLOGY CONSULT NOTE   Patient ID: Misty Meyer MRN: [id number] DOB/AGE: 06-25-1940 72 y.o.  Admit date: 02/17/2013  Primary Physician   Rene Paci, MD Primary Cardiologist   DM Reason for Consultation   Atrial fibrillation  HPI: Misty Meyer is a 72 y/o female w/ h/o HTN, HLD, PAFib, seizures, IBS, GERD, and minimal h/o CAD. Cardiac catheterization 2010: non-obstructive CAD.  Echo 12/2012: EF 55-60%, mild L atrial dilation.   Misty Meyer is a pleasant 72 y/o woman who presents to the ED today w/ c/o tachypalpations.  She was a pt of Dr. [name], and now sees Dr. Shirlee Latch for her PAF. She saw Dr. Shirlee Latch on 08/23 and was in NSR at that time.   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  When she got home around 5PM she fell asleep on the couch and did not wake until 9:15PM.  She ate a bowl of cereal with milk and then took her evening dose of Multaq around 9:30PM.  She normally eats and takes her medications around 6/630PM.  Around 11:30PM she laid down to go to sleep when she suddenly felt her heart start to race and flutter. This was concerning to her, as she had just been seen in the ED on 8/30 for similar sx. She denies any syncope, near-syncope, CP, SOB, cough, N/V, diaphoresis, swelling, fever or chills. She waited about 2 hours for her sx to subside, but they did not.  She called 911 and EMS transferred her to [facility] ED. En route to the hospital, she states that the medics told her that she was in atrial fibrillation and that her HR was as high as 170s.    Misty Meyer was started on cardizem gtt for rate control in the ED, and is currently resting comfortably. She states that she feels better, but can still feel that her HR is a little elevated and skipping occasionally.  However, now she only notices it if she feels her chest.  Her HR has been running between 110-135 during the interview and is still in atrial fibrillation. She  continues to deny any syncope, near syncope, CP, SOB, diaphoresis, cough, N/V, swelling, fever or chills.   She has been wearing a LifeWatch monitor since 09/08, and reports some episodes of AFib.  However, she was not symptomatic when these events occurred. She is on Multaq 400mg  BID and Toprol-XL 50mg  QD. She has no personal history of stroke, and takes ASA 325mg  QD. She has a CHADSVASc score of 3, but given her h/o grand mal seizure and fall risk, she hasn't been started on anticoagulation to date.  Her last seizure was 2.5 years ago and she takes phenobarbital and Keppra. Dr. Alford Highland note from 08/23 suggested the possibility of d/c ASA and starting Eliquis 5mg  BID if she remained seizure-free.    Past Medical History  Diagnosis Date  . CAD (coronary artery disease)     nonobst cath 12/2008  . HLD (hyperlipidemia)   . Calculus of gallbladder without mention of cholecystitis or obstruction 03/2011    s/p lap chole  . IBS (irritable bowel syndrome)   . GERD (gastroesophageal reflux disease)   . Tinnitus   . Seizure disorder     Grand mal seizure 7/12, [facility]  . Anemia   . Allergic rhinitis, cause unspecified   . Major depressive disorder, single episode, severe, without mention of psychotic behavior   . Arthritis of  back   . Atrial fibrillation     on Multaq  . Asthma   . Osteoporosis     DEXA 04/2011: -3.1 L fem, on Boniva  . Diverticulosis   . Seizures      Past Surgical History  Procedure Laterality Date  . Total abdominal hysterectomy    . Wrist fracture surgery      with plates (left)  . Tonsillectomy      age 25  . Cholecystectomy  03/25/2011    Allergies  Allergen Reactions  . Codeine Other (See Comments)    Caused possible hallucinations and anxiety  . Latex Rash  . Neomycin-Bacitracin Zn-Polymyx Itching and Rash  . Penicillins Swelling    Lips    I have reviewed the patient's current medications   . diltiazem (CARDIZEM) infusion 10 mg/hr (02/17/13  0700)     Prior to Admission medications   Medication Sig Start Date End Date Taking? Authorizing Provider  acetaminophen (TYLENOL) 500 MG tablet Take 500 mg by mouth every 6 (six) hours as needed for pain.    Yes Historical Provider, MD  albuterol (PROAIR HFA) 108 (90 BASE) MCG/ACT inhaler Inhale 2 puffs into the lungs every 4 (four) hours as needed for wheezing. 09/30/12  Yes Newt Lukes, MD  aspirin 325 MG tablet Take 325 mg by mouth daily.     Yes Historical Provider, MD  beclomethasone (QVAR) 40 MCG/ACT inhaler Inhale 2 puffs into the lungs 2 (two) times daily. 02/10/12  Yes Storm Frisk, MD  diclofenac (VOLTAREN) 75 MG EC tablet Take 75 mg by mouth 2 (two) times daily.   Yes Historical Provider, MD  dronedarone (MULTAQ) 400 MG tablet Take 1 tablet (400 mg total) by mouth 2 (two) times daily with a meal. 01/19/13  Yes Laurey Morale, MD  fluticasone [facility]) 50 MCG/ACT nasal spray Place 2 sprays into the nose 2 (two) times daily.   Yes Historical Provider, MD  ibandronate (BONIVA) 150 MG tablet Take 1 tablet (150 mg total) by mouth every 30 (thirty) days. Take in the morning with a full glass of water, on an empty stomach, and do not take anything else by mouth or lie down for the next 30 min. 12/13/12  Yes Newt Lukes, MD  levETIRAcetam (KEPPRA) 500 MG tablet Take 1 tablet (500 mg total) by mouth 2 (two) times daily. 09/12/12  Yes Nilda Riggs, NP  loperamide (IMODIUM A-D) 2 MG tablet Take 2 mg by mouth 3 (three) times daily as needed. For diarrhea   Yes Historical Provider, MD  metoprolol succinate (TOPROL-XL) 50 MG 24 hr tablet Take 50 mg by mouth daily. Take with or immediately following a meal.   Yes Historical Provider, MD  nitroGLYCERIN (NITROSTAT) 0.4 MG SL tablet Place 0.4 mg under the tongue every 5 (five) minutes as needed for chest pain.   Yes Historical Provider, MD  PHENobarbital (LUMINAL) 64.8 MG tablet Take 64.8 mg by mouth 2 (two) times daily.   Yes  Historical Provider, MD  ranitidine (ZANTAC) 150 MG tablet Take 150 mg by mouth daily.  09/30/12  Yes Newt Lukes, MD  rosuvastatin (CRESTOR) 20 MG tablet Take 1 tablet (20 mg total) by mouth daily. 01/19/13  Yes Laurey Morale, MD  sodium chloride (OCEAN) 0.65 % nasal spray 3 sprays by Nasal route 3 (three) times daily.    Yes Historical Provider, MD  spironolactone (ALDACTONE) 25 MG tablet Take 0.5 tablets (12.5 mg total) by mouth daily.  01/29/13  Yes Calvert Cantor, MD  tiZANidine (ZANAFLEX) 4 MG tablet Take 1 tablet (4 mg total) by mouth every 6 (six) hours as needed. 01/24/13  Yes Newt Lukes, MD  traMADol (ULTRAM) 50 MG tablet Take 1 tablet (50 mg total) by mouth every 8 (eight) hours as needed for pain. 01/02/13  Yes Nicki Reaper, NP     History   Social History  . Marital Status: Divorced    Spouse Name: N/A    Number of Children: 0  . 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     Social History Main Topics  . Smoking status: Never Smoker   . Smokeless tobacco: Never Used  . Alcohol Use: No     Comment: quit 2012  . Drug Use: No  . Sexual Activity: No   Other Topics Concern  . Not on file   Social History Narrative  . No narrative on file    Family Status  Relation Status Death Age  . Mother Deceased   . Father Deceased   . Maternal Aunt Deceased   . Maternal Aunt Deceased   . Brother Alive    Family History  Problem Relation Age of Onset  . Angina Mother   . Alzheimer's disease Mother   . Heart attack Father   . Heart failure Father   . Heart disease Father   . Heart disease Brother   . Breast cancer Maternal Aunt   . Cancer Maternal Aunt     breast  . Cervical cancer Maternal Aunt   . Cancer Maternal Aunt     ovarian  . Colon cancer Neg Hx      ROS:  Full 14 point review of systems complete and found to be negative unless listed above.  Physical Exam: Blood pressure 105/54, pulse 115,  temperature 97.8 F (36.6 C), temperature source Oral, resp. rate 22, SpO2 98.00%.  General: Well developed, well nourished, WF in no acute distress Head: Normocephalic and atraumatic Lungs: CTAB Heart: Heart irregular rate and rhythm with S1, S2, no murmur. pulses are 2+ all extrem, irregular rhythm.   Neck: No carotid bruits. No JVD elevation. Abdomen: Bowel sounds present, abdomen soft and non-tender without masses or hernias noted. Extremities: No clubbing or cyanosis. No pitting edema.  Neuro: Alert and oriented X 3. No focal deficits noted. Psych:  Good affect, responds appropriately Skin: No rashes or lesions noted.  Labs:   Lab Results  Component Value Date   WBC 8.4 02/17/2013   HGB 12.6 02/17/2013   HCT 37.0 02/17/2013   MCV 94.1 02/17/2013   PLT 171 02/17/2013    Recent Labs Lab 02/17/13 0602  NA 141  K 3.9  CL 108  BUN 38*  CREATININE 1.50*  GLUCOSE 84   Magnesium  Date Value Range Status  01/28/2013 2.3  1.5 - 2.5 mg/dL Final    Recent Labs  45/40/98 0601  TROPIPOC 0.00   Lab Results  Component Value Date   CHOL 118 01/28/2013   HDL 45 01/28/2013   LDLCALC 56 01/28/2013   TRIG 83 01/28/2013   TSH  Date/Time Value Range Status  01/28/2013  8:39 AM 0.176* 0.350 - 4.500 uIU/mL Final     Performed at [facility]    Echo:  01/28/2013: - Left ventricle: The cavity size was normal. Wall thickness was normal. Systolic function was normal. The estimated ejection fraction was in  the range of 55% to 60%. Wall motion was normal; there were no regional wall motion abnormalities. Features are consistent with a pseudonormal left ventricular filling pattern, with concomitant abnormal relaxation and increased filling pressure (grade 2 diastolic dysfunction). - Aortic valve: There was no stenosis. Trivial regurgitation. - Mitral valve: Mildly calcified annulus. Trivial regurgitation. - Left atrium: The atrium was mildly dilated. - Right ventricle: The  cavity size was normal. Systolic function was normal. - Pulmonary arteries: No complete TR doppler jet so unable to estimate PA systolic pressure. - Inferior vena cava: The vessel was normal in size; the respirophasic diameter changes were in the normal range (= 50%); findings are consistent with normal central venous Pressure. Impressions: - Normal LV size with EF 55-60%. Moderate diastolic dysfunction. Normal RV size and systolic function. No significant valvular abnormalities.   ECG:   02/17/2013: Atrial fibrillation w/ RVR.  No ST/T changes  Vent. rate 141 BPM PR interval * ms QRS duration 68 ms QT/QTc 292/448 ms P-R-T axes * -6 208  Radiology:  Dg Chest Portable 1 View 02/17/2013   *RADIOLOGY REPORT*  Clinical Data: Atrial fibrillation  PORTABLE CHEST - 1 VIEW  Comparison: Prior radiograph from 01/28/2013  Findings: Electrodes overlie the thorax.  Cardiac and mediastinal silhouettes are stable in size and contour, and remain within normal limits.  Lungs are normally inflated.  No airspace consolidation, pleural effusion, or pulmonary edema is identified.  There is no pneumothorax.  Bony thorax is intact.  IMPRESSION: No radiographic evidence of acute cardiopulmonary process.   Original Report Authenticated By: Rise Mu, M.D.   07/03/2009 Soft tissue US Findings: The thyroid echotexture is diffusely heterogeneous. The  right lobe measures 4.7 x 2.4 x 1.6 cm. The left lobe measures 5.7  x 3.1 x 2.1 cm. The isthmus measures 6.4 mm.  There are several thyroid nodules bilaterally. The nodules on the  right are similar in size and appearance, measuring 12 mm  maximally. There is a dominant nodule inferiorly on the left which  measures 3.3 x 2.2 x 2.6 cm. This appears solid with some  vascularity on color Doppler.  IMPRESSION:  1. Scan findings are compatible with multinodular goiter.  2. There is a dominant solid nodule inferiorly on the left  measuring up to 3.3 cm in  diameter. Because of the size of this  nodule, malignancy cannot be excluded. Fine-needle aspiration of  this nodule is recommended.   ASSESSMENT AND PLAN:    Active Problems:   Atrial fibrillation/RVR -- Pt started on cardizem gtt for rate control, HR 110s currently.  She has been on Multaq since January 2012.   She is currently wearing a lifewatch monitor. We called the company and found out that this is her first episode since the last discharge 2 weeks ago and it started at 2:30 am today.  She cardioverted to SR at 9:30 am. We will increase her Toprol XL to 100 mg PO QD. We had a discussion regarding anticoagulation. She stated that her neurologist is concerned about her risk of falls. 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 We will defer for now.  Upon reviewing her last TSH from 01/28/2013 and it was low. We will send free T3, free T4 and order a thyroid scan.  She will be discharged from the ER with an early follow up in our clinic.   Signed: Patriciaann Clan 02/17/2013 8:14 AM   The patient was seen, examined and discussed with  Theodore Demark, PA-C and I agree with the above.  Tobias Alexander, H 02/17/2013  Co-Sign MD

## 2013-02-17 NOTE — ED Notes (Signed)
Per EMS: Patient from home with A-fib with RVR. HR 110-170. BP 116/92. Pt asymptomatic at this time. Pt has been wearing holter monitor. Ax4, NAD.

## 2013-02-20 ENCOUNTER — Telehealth: Payer: Self-pay | Admitting: Cardiology

## 2013-02-20 NOTE — Telephone Encounter (Signed)
Spoke with patient, she will ask Dr Shirlee Latch at time of appt with him tomorrow about going to work on Thursday.

## 2013-02-20 NOTE — Telephone Encounter (Signed)
Pt wants to know if she needs to stay out of work on Thursday , feels fine, only works on thursdays

## 2013-02-21 ENCOUNTER — Encounter: Payer: Self-pay | Admitting: Cardiology

## 2013-02-21 ENCOUNTER — Telehealth: Payer: Self-pay | Admitting: Cardiology

## 2013-02-21 ENCOUNTER — Telehealth: Payer: Self-pay | Admitting: *Deleted

## 2013-02-21 ENCOUNTER — Ambulatory Visit: Payer: Medicare Other | Admitting: Cardiology

## 2013-02-21 ENCOUNTER — Ambulatory Visit (INDEPENDENT_AMBULATORY_CARE_PROVIDER_SITE_OTHER): Payer: Medicare Other | Admitting: Cardiology

## 2013-02-21 VITALS — BP 90/55 | HR 73 | Ht 63.0 in | Wt 157.0 lb

## 2013-02-21 DIAGNOSIS — I4891 Unspecified atrial fibrillation: Secondary | ICD-10-CM

## 2013-02-21 DIAGNOSIS — E0590 Thyrotoxicosis, unspecified without thyrotoxic crisis or storm: Secondary | ICD-10-CM

## 2013-02-21 DIAGNOSIS — I2510 Atherosclerotic heart disease of native coronary artery without angina pectoris: Secondary | ICD-10-CM

## 2013-02-21 DIAGNOSIS — G40909 Epilepsy, unspecified, not intractable, without status epilepticus: Secondary | ICD-10-CM

## 2013-02-21 DIAGNOSIS — E041 Nontoxic single thyroid nodule: Secondary | ICD-10-CM

## 2013-02-21 MED ORDER — FLECAINIDE ACETATE 50 MG PO TABS: 50.0000 mg | ORAL_TABLET | Freq: Two times a day (BID) | ORAL | Status: AC

## 2013-02-21 MED ORDER — APIXABAN 5 MG PO TABS: 5.0000 mg | ORAL_TABLET | Freq: Two times a day (BID) | ORAL | Status: AC

## 2013-02-21 NOTE — Patient Instructions (Addendum)
Stop aspirin.  Start Eliquis 5mg  two times a day.   Stop Multaq.  Start Flecainide 50mg  two times a day.   Your physician has requested that you have en exercise stress myoview. For further information please visit [email or web address] Please follow instruction sheet, as given. THIS WEEK  You have been referred to [facility] for management of hyperthyroidism.  Your physician recommends that you schedule a follow-up appointment in: 2 weeks with Dr Shirlee Latch.

## 2013-02-21 NOTE — Telephone Encounter (Addendum)
Pt calling re new meds she was given today, wants to make sure it was not going to interfere with the other meds she's  On, pls call [phone]

## 2013-02-21 NOTE — Telephone Encounter (Signed)
Pt.notified

## 2013-02-21 NOTE — Telephone Encounter (Signed)
eliquis 5 mg bid approved through [employer]

## 2013-02-21 NOTE — Telephone Encounter (Signed)
Prior authorization obtained for Eliquis 5mg  bid for 1 year--[id number] [phone].

## 2013-02-22 NOTE — Progress Notes (Signed)
Patient ID: Misty Meyer, female   DOB: [birth year], 72 y.o.   MRN: [id number] PCP: Dr. Felicity Meyer  72 yo with history of paroxysmal atrial fibrillation and seizure disorder presents for followup.  She had a grand mal seizure 2 years ago and fell/hit her head.  She has not been on anticoagulation due to concern for injury risk with seizure disorder. She has not had any seizures over the last couple of years since she has been on Keppra.   No exertional dyspnea or chest pain.   At last visit, she had been doing well with no tachypalpitations on Multaq.  She has been taking Multaq correctly with food.  However, in 8/14 she was admitted to [facility] with tachypalpitations and was found to be in atrial fibrillation with RVR.  She was treated with diltiazem gtt and converted back to NSR.  She was sent home with no medication changes.  She continued to have runs of tachypalpitations and was actually seen back in the ER in 9/14 with atrial fibrillation/RVR.  This time, rate was controlled and she converted to NSR and was sent home from ER.  She is wearing an event monitor which has shown runs of atrial fibrillation with RVR.  Also of note, TSH was suppressed on last labs with normal free T3 and free T4.  Ultrasound of the thyroids showed a multinodular goiter. She has lost weight.   ECG: NSR, nonspecific T wave flattening   Labs (1/13): LDL 69, HDL 56 Labs (2/14): K 3.8, creatinine 1.0 Labs (9/14): K 4.5, creatinine 1.2, TSH low, free T4 and free T3 normal  PMH: 1. CAD: Nonobstructive on LHC 8/10. Echo (8/14) with EF 55-60%, no LVH, grade II diastolic dysfunction, RV normal.  2. Hyperlipidemia 3. IBS 4. GERD 5. Seizure disorder since childhood. Grand Mal seizure last in 2012. Now on Keppra. 6. Paroxysmal atrial fibrillation.  7. Diverticulosis 8. CCY 9. HTN 10. Multinodular goiter with subclinical hyperthyroidism.   SH: Divorced, nonsmoker, lives in [place], Interior and spatial designer.  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   Current Outpatient Prescriptions  Medication Sig Dispense Refill  . acetaminophen (TYLENOL) 500 MG tablet Take 500 mg by mouth every 6 (six) hours as needed for pain.       Marland Kitchen albuterol (PROAIR HFA) 108 (90 BASE) MCG/ACT inhaler Inhale 2 puffs into the lungs every 4 (four) hours as needed for wheezing.  3 Inhaler  3  . beclomethasone (QVAR) 40 MCG/ACT inhaler Inhale 2 puffs into the lungs 2 (two) times daily.  8.7 g  11  . diclofenac (VOLTAREN) 75 MG EC tablet Take 75 mg by mouth 2 (two) times daily.      . fluticasone (FLONASE) 50 MCG/ACT nasal spray Place 2 sprays into the nose daily.       Marland Kitchen ibandronate (BONIVA) 150 MG tablet Take 1 tablet (150 mg total) by mouth every 30 (thirty) days. Take in the morning with a full glass of water, on an empty stomach, and do not take anything else by mouth or lie down for the next 30 min.  3 tablet  3  . levETIRAcetam (KEPPRA) 500 MG tablet Take 1 tablet (500 mg total) by mouth 2 (two) times daily.  60 tablet  11  . loperamide (IMODIUM A-D) 2 MG tablet Take 2 mg by mouth 3 (three) times daily as needed. For diarrhea      . metoprolol succinate (TOPROL-XL) 50 MG 24 hr tablet  Take 50 mg by mouth daily. Take with or immediately following a meal.      . nitroGLYCERIN (NITROSTAT) 0.4 MG SL tablet Place 0.4 mg under the tongue every 5 (five) minutes as needed for chest pain.      Marland Kitchen PHENobarbital (LUMINAL) 64.8 MG tablet Take 64.8 mg by mouth 2 (two) times daily.      . ranitidine (ZANTAC) 150 MG tablet Take 150 mg by mouth daily.       . rosuvastatin (CRESTOR) 20 MG tablet Take 1 tablet (20 mg total) by mouth daily.  90 tablet  3  . sodium chloride (OCEAN) 0.65 % nasal spray 3 sprays by Nasal route 3 (three) times daily.       Marland Kitchen spironolactone (ALDACTONE) 25 MG tablet Take 0.5 tablets (12.5 mg total) by mouth daily.  30 tablet  0  . tiZANidine (ZANAFLEX) 4 MG tablet Take 1 tablet (4 mg total) by mouth every 6 (six)  hours as needed.  30 tablet  0  . traMADol (ULTRAM) 50 MG tablet Take 1 tablet (50 mg total) by mouth every 8 (eight) hours as needed for pain.  30 tablet  0  . apixaban (ELIQUIS) 5 MG TABS tablet Take 1 tablet (5 mg total) by mouth 2 (two) times daily.  60 tablet  3  . flecainide (TAMBOCOR) 50 MG tablet Take 1 tablet (50 mg total) by mouth 2 (two) times daily.  60 tablet  3  . [DISCONTINUED] potassium chloride (KLOR-CON) 10 MEQ CR tablet Take 1 tablet (10 mEq total) by mouth daily.  30 tablet  11   No current facility-administered medications for this visit.    BP 90/55  Pulse 73  Ht 5\' 3"  (1.6 m)  Wt 71.215 kg (157 lb)  BMI 27.82 kg/m2 General: NAD Neck: No JVD, no thyromegaly or thyroid nodule.  Lungs: Clear to auscultation bilaterally with normal respiratory effort. CV: Nondisplaced PMI.  Heart regular S1/S2, no S3/S4, no murmur.  Trace ankle edema.  No carotid bruit.  Normal pedal pulses.  Abdomen: Soft, nontender, no hepatosplenomegaly, no distention.  Skin: Intact without lesions or rashes.  Neurologic: Alert and oriented x 3.  Psych: Normal affect.  Assessment/plan: 1. Atrial fibrillation: Paroxysmal, in NSR today.  CHADSVASC = 3 (age, gender, HTN).  She is not on anticoagulation given fall/injury risk with seizure disorder.  However, she has not had a seizure in 2 years on Keppra.  She is now having symptomatic breakthrough runs of atrial fibrillation with RVR despite Multaq use.   - Stop ASA and start Eliquis 5 mg bid.   - Continue Toprol XL - Stop Multaq and start flecainide 50 mg bid.  No LVH on recent echo.  Mild CAD only on prior cath.  I will arrange for ETT-Cardiolite given flecainide initiation.  2. HTN: BP actually running on the low side.  She is not symptomatic from this and is no longer taking HCTZ.  3. Subclinical hyperthyroidism: With multinodular goiter.  I am going to refer her to endocrine for evaluation.  I am concerned that this will be a driver for atrial  fibrillation.   Followup in 2 wks.   Misty Meyer 02/22/2013

## 2013-02-27 ENCOUNTER — Ambulatory Visit: Payer: Medicare Other | Admitting: Cardiology

## 2013-02-28 ENCOUNTER — Telehealth: Payer: Self-pay | Admitting: Cardiology

## 2013-02-28 NOTE — Telephone Encounter (Signed)
New Problem:  Pt states she needs more batteries for her heart monitor.

## 2013-02-28 NOTE — Telephone Encounter (Signed)
Returned call to patient advised to call [employer] they will send new batteries.

## 2013-02-28 NOTE — Telephone Encounter (Signed)
Returned call to patient ok to have teeth cleaned and ok to have a hair perm while wearing heart monitor.

## 2013-02-28 NOTE — Telephone Encounter (Signed)
New problem   Pt is wearing heart monitor and want to know if its ok to have her teeth clean tomorrow.

## 2013-03-02 ENCOUNTER — Encounter: Payer: Self-pay | Admitting: Cardiology

## 2013-03-06 ENCOUNTER — Ambulatory Visit (HOSPITAL_COMMUNITY): Payer: Medicare Other | Attending: Cardiology | Admitting: Radiology

## 2013-03-06 ENCOUNTER — Telehealth: Payer: Self-pay

## 2013-03-06 VITALS — BP 143/70 | HR 60 | Ht 63.0 in | Wt 157.0 lb

## 2013-03-06 DIAGNOSIS — I2510 Atherosclerotic heart disease of native coronary artery without angina pectoris: Secondary | ICD-10-CM

## 2013-03-06 DIAGNOSIS — R9431 Abnormal electrocardiogram [ECG] [EKG]: Secondary | ICD-10-CM | POA: Insufficient documentation

## 2013-03-06 DIAGNOSIS — R000 Tachycardia, unspecified: Secondary | ICD-10-CM | POA: Insufficient documentation

## 2013-03-06 DIAGNOSIS — R079 Chest pain, unspecified: Secondary | ICD-10-CM

## 2013-03-06 DIAGNOSIS — R002 Palpitations: Secondary | ICD-10-CM | POA: Insufficient documentation

## 2013-03-06 DIAGNOSIS — I10 Essential (primary) hypertension: Secondary | ICD-10-CM | POA: Insufficient documentation

## 2013-03-06 DIAGNOSIS — Z8249 Family history of ischemic heart disease and other diseases of the circulatory system: Secondary | ICD-10-CM | POA: Insufficient documentation

## 2013-03-06 DIAGNOSIS — R0789 Other chest pain: Secondary | ICD-10-CM | POA: Insufficient documentation

## 2013-03-06 DIAGNOSIS — R5381 Other malaise: Secondary | ICD-10-CM | POA: Insufficient documentation

## 2013-03-06 DIAGNOSIS — E785 Hyperlipidemia, unspecified: Secondary | ICD-10-CM | POA: Insufficient documentation

## 2013-03-06 DIAGNOSIS — E0590 Thyrotoxicosis, unspecified without thyrotoxic crisis or storm: Secondary | ICD-10-CM

## 2013-03-06 DIAGNOSIS — I4891 Unspecified atrial fibrillation: Secondary | ICD-10-CM | POA: Insufficient documentation

## 2013-03-06 MED ORDER — TECHNETIUM TC 99M SESTAMIBI GENERIC - CARDIOLITE
30.0000 | Freq: Once | INTRAVENOUS | Status: AC | PRN
  Administered 2013-03-06: 30 via INTRAVENOUS

## 2013-03-06 MED ORDER — SULFAMETHOXAZOLE-TRIMETHOPRIM 800-160 MG PO TABS: ORAL_TABLET | ORAL | Status: AC

## 2013-03-06 MED ORDER — CETIRIZINE HCL 10 MG PO TABS: 10.0000 mg | ORAL_TABLET | Freq: Every day | ORAL | Status: AC

## 2013-03-06 MED ORDER — TECHNETIUM TC 99M SESTAMIBI GENERIC - CARDIOLITE
10.0000 | Freq: Once | INTRAVENOUS | Status: AC | PRN
  Administered 2013-03-06: 10 via INTRAVENOUS

## 2013-03-06 MED ORDER — MUPIROCIN 2 % EX OINT: TOPICAL_OINTMENT | Freq: Three times a day (TID) | CUTANEOUS | Status: DC

## 2013-03-06 NOTE — Telephone Encounter (Signed)
Patient came to office for myoview today 03/06/13 she complained of recent electrodes from heart monitor irritating her skin.Dr.Nelson ordered Aveeno soothing oatmeal bath,zrytec 10 mg daily for 1 week,bactrim ds 800/160 mg twice a day for 7 days,bactroban ointment three times a day for 1 week.Prescriptions sent to pharmacy.

## 2013-03-06 NOTE — Progress Notes (Addendum)
[facility] SITE 3 NUCLEAR MED [address] [place], [place] [address] [phone]    Cardiology Nuclear Med Study  Misty Meyer is a 72 y.o. female     MRN : [id number]     DOB: January 18, 1941  Procedure Date: 03/06/2013  Nuclear Med Background Indication for Stress Test:  Evaluation for Ischemia, Post Hospital:8/14 &9/14 Hospitalization with Afib with RVR and recently started Flecainide and Abnormal EKG:T wave flattening History:  '10 Heart Catheterization: No CAD or EF;8/14 Echo:EF=55-65%, h/o  Atrial Fib Cardiac Risk Factors: Family History - CAD, Hypertension and Lipids  Symptoms:  Chest Tightness (last episode of chest discomfort: none since 8/14 ED visit), Fatigue, Palpitations and Rapid HR   Nuclear Pre-Procedure Caffeine/Decaff Intake:  None NPO After: 7:30am   Lungs:  Clear. O2 Sat: 99% on room air. IV 0.9% NS with Angio Cath:  22g  IV Site: R Hand  IV Started by:  Cathlyn Parsons, RN  Chest Size (in):  38 Cup Size: B  Height: 5\' 3"  (1.6 m)  Weight:  157 lb (71.215 kg)  BMI:  Body mass index is 27.82 kg/(m^2). Tech Comments: No Toprol x 24 hrs.    Nuclear Med Study 1 or 2 day study: 1 day  Stress Test Type:  Stress  Reading MD: Olga Millers, MD  Order Authorizing Provider:  [name], MD  Resting Radionuclide: Technetium 33m Sestamibi  Resting Radionuclide Dose: 11.0 mCi   Stress Radionuclide:  Technetium 35m Sestamibi  Stress Radionuclide Dose: 32.7 mCi           Stress Protocol Rest HR: 60 Stress HR: 137  Rest BP: 121/70 Stress BP: 150/80  Exercise Time (min): 5:51 METS: 6.8   Predicted Max HR: 150 bpm % Max HR: 40 bpm Rate Pressure Product: 9000   Dose of Adenosine (mg):  n/a Dose of Lexiscan: n/a mg  Dose of Atropine (mg): n/a Dose of Dobutamine: n/a mcg/kg/min (at max HR)  Stress Test Technologist: Smiley Houseman, CMA-N  Nuclear Technologist:  Doyne Keel, CNMT     Rest Procedure:  Myocardial perfusion imaging was performed at  rest 45 minutes following the intravenous administration of Technetium 45m Sestamibi.  Rest ECG: NSR with non-specific ST-T wave changes  Stress Procedure:  The patient exercised on the treadmill utilizing the Bruce Protocol for 5:51 minutes. She just stopped walking on the treadmill, said her legs gave out on her.  We caught her before she hit anything.  She denied any chest pain.  There were occasional PVC's noted in recovery.  Technetium 16m Sestamibi was injected at peak exercise and myocardial perfusion imaging was performed after a brief delay.  Stress ECG: No significant ST segment change suggestive of ischemia.  QPS Raw Data Images:  Acquisition technically good; normal left ventricular size. Stress Images:  Normal homogeneous uptake in all areas of the myocardium. Rest Images:  Normal homogeneous uptake in all areas of the myocardium. Subtraction (SDS):  No evidence of ischemia. Transient Ischemic Dilatation (Normal <1.22):  n/a Lung/Heart Ratio (Normal <0.45):  0.50  Quantitative Gated Spect Images QGS EDV:  67 ml QGS ESV:  17 ml  Impression Exercise Capacity:  Fair exercise capacity. BP Response:  Normal blood pressure response. Clinical Symptoms:  No chest pain or dyspnea. ECG Impression:  No significant ST segment change suggestive of ischemia. Comparison with Prior Nuclear Study: No images to compare  Overall Impression:  Normal stress nuclear study.  LV Ejection Fraction: 74%.  LV Wall Motion:  NL LV Function; NL Wall Motion   Olga Millers  Normal stress test, exercise ECG ok.  May continue flecainide.   [name] 03/07/2013

## 2013-03-07 ENCOUNTER — Telehealth: Payer: Self-pay | Admitting: Cardiology

## 2013-03-07 NOTE — Telephone Encounter (Signed)
New Problem ° °Pt calling for stress test results.  °

## 2013-03-07 NOTE — Telephone Encounter (Signed)
Spoke with pt and told her stress test had not been reviewed by Dr. Shirlee Latch yet and we would call her with results once reviewed. Pt is anxious to get results as she will be moving this Saturday

## 2013-03-08 NOTE — Progress Notes (Signed)
Pt.notified

## 2013-03-08 NOTE — Telephone Encounter (Signed)
Pt given myoview results. 

## 2013-03-10 ENCOUNTER — Encounter: Payer: Self-pay | Admitting: Internal Medicine

## 2013-03-10 ENCOUNTER — Other Ambulatory Visit: Payer: Self-pay | Admitting: Cardiology

## 2013-03-10 ENCOUNTER — Ambulatory Visit (INDEPENDENT_AMBULATORY_CARE_PROVIDER_SITE_OTHER): Payer: Medicare Other | Admitting: Internal Medicine

## 2013-03-10 ENCOUNTER — Telehealth: Payer: Self-pay | Admitting: Cardiology

## 2013-03-10 VITALS — BP 100/62 | HR 79 | Temp 97.3°F | Resp 12 | Ht 63.0 in | Wt 162.0 lb

## 2013-03-10 DIAGNOSIS — E0590 Thyrotoxicosis, unspecified without thyrotoxic crisis or storm: Secondary | ICD-10-CM

## 2013-03-10 LAB — T4, FREE: Free T4: 0.8 ng/dL (ref 0.60–1.60)

## 2013-03-10 MED ORDER — MUPIROCIN 2 % EX OINT: TOPICAL_OINTMENT | Freq: Three times a day (TID) | CUTANEOUS | Status: AC

## 2013-03-10 NOTE — Progress Notes (Signed)
Patient ID: Misty Meyer, female   DOB: [birth year], 72 y.o.   MRN: [id number]   HPI  Misty Meyer is a 72 y.o.-year-old female, referred by her cardiologist, Dr. Violeta Gelinas, for evaluation for subclinical hyperthyroidism. There is concern for her subclinical hyperthyroidism contributing to her paroxysmal A. Fib. She was in the ED 2x in tha last mo for Afib with RVR. Last time: 1 week ago.  Of note, patient is moving to [place].  Pt was dx with hypothyroidism in the past and was started on Levothyroxine but then stopped as she was feeling "fine".   I reviewed pt's thyroid tests: Lab Results  Component Value Date   TSH 0.166* 02/17/2013   TSH 0.176* 01/28/2013   TSH 0.37 06/23/2011   TSH 0.34* 06/08/2011   TSH 1.36 12/24/2010   FREET4 1.18 02/17/2013   FREET4 1.32 03/22/2010  Free T3 was 2.8, normal, in 02/17/2013.  Pt c/o: - + feels sluggish - + cold intolerance - + tremors, not new - + anxiety - + palpitations - no fatigue - no diarrhea - 1 stool a day - + weight loss - 170 lbs >> 156 lbs >> 162 today but unclear of the time frame, likely few months  Pt denies feeling nodules in neck, hoarseness, has some dysphagia with pills, no odynophagia, SOB with lying down. Patient had a recent thyroid ultrasound on 02/17/2013, showing a multinodular goiter, with the largest thyroid nodule in the left lobe, measuring 3.3 x 2.3 x 3.0 cm, unchanged from a previous ultrasound in 07/2009. At that time, she had FNA of this nodule on 08/13/2009: Non-neoplastic goiter. In the left lobe, she has diffuse heterogeneity of the echotexture with 2 solid nodules identified, larger being 1.3 cm. These did not appear to be enlarged from the previous ultrasound.  Pt does not have a FH of thyroid ds. No FH of thyroid cancer. No h/o radiation tx to head or neck.  No seaweed or kelp, no recent contrast studies. No steroid use. No herbal supplements.   I reviewed her chart and she also has a history of  ischemic heart disease, mild CAD per cath, also paroxystic A. Fib, not on anticoagulation because of history of seizure disorder, with latest grand mal seizure 2.5 years ago when she hit her head. She was then started on Keppra, in addition to Phenobarbital, and has not have a seizure since then. She also has a history of hyperlipidemia, anemia, GERD, IBS, osteoporosis, overactive bladder.  ROS: Constitutional: see HPI Eyes: no blurry vision, no xerophthalmia ENT: no sore throat, no nodules palpated in throat, + dysphagia/no odynophagia, no hoarseness, + tinnitus Cardiovascular: no CP/SOB/palpitations/leg swelling Respiratory: no cough/SOB Gastrointestinal: no N/V/+ D/no C Musculoskeletal: no muscle/joint aches Skin: + rash from an allergic rxn to the gel of the heart rate monitor >> on abd and chest Neurological: + mild tremors/numbness/tingling/dizziness Psychiatric: no depression/+ anxiety  Past Medical History  Diagnosis Date  . CAD (coronary artery disease)     nonobst cath 12/2008  . HLD (hyperlipidemia)   . Calculus of gallbladder without mention of cholecystitis or obstruction 03/2011    s/p lap chole  . IBS (irritable bowel syndrome)   . GERD (gastroesophageal reflux disease)   . Tinnitus   . Seizure disorder     Grand mal seizure 7/12, [facility]  . Anemia   . Allergic rhinitis, cause unspecified   . Major depressive disorder, single episode, severe, without mention of psychotic behavior   .  Arthritis of back   . Atrial fibrillation     on Multaq  . Asthma   . Osteoporosis     DEXA 04/2011: -3.1 L fem, on Boniva  . Diverticulosis   . Seizures    Past Surgical History  Procedure Laterality Date  . Total abdominal hysterectomy    . Wrist fracture surgery      with plates (left)  . Tonsillectomy      age 14  . Cholecystectomy  03/25/2011   History   Social History  . Marital Status: Divorced    Spouse Name: N/A    Number of Children: 0  . Years of  Education: N/A   Occupational History  . 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     Social History Main Topics  . Smoking status: Never Smoker   . Smokeless tobacco: Never Used  . Alcohol Use: No     Comment: quit 2012  . Drug Use: No  . Sexual Activity: No   Other Topics Concern  . Not on file   Social History Narrative   Regular exercise: walk at work and her complex   Caffeine use: none   Current Outpatient Prescriptions on File Prior to Visit  Medication Sig Dispense Refill  . acetaminophen (TYLENOL) 500 MG tablet Take 500 mg by mouth every 6 (six) hours as needed for pain.       Marland Kitchen albuterol (PROAIR HFA) 108 (90 BASE) MCG/ACT inhaler Inhale 2 puffs into the lungs every 4 (four) hours as needed for wheezing.  3 Inhaler  3  . apixaban (ELIQUIS) 5 MG TABS tablet Take 1 tablet (5 mg total) by mouth 2 (two) times daily.  60 tablet  3  . beclomethasone (QVAR) 40 MCG/ACT inhaler Inhale 2 puffs into the lungs 2 (two) times daily.  8.7 g  11  . cetirizine (ZYRTEC) 10 MG tablet Take 1 tablet (10 mg total) by mouth daily.  30 tablet  0  . diclofenac (VOLTAREN) 75 MG EC tablet Take 75 mg by mouth 2 (two) times daily.      . flecainide (TAMBOCOR) 50 MG tablet Take 1 tablet (50 mg total) by mouth 2 (two) times daily.  60 tablet  3  . fluticasone (FLONASE) 50 MCG/ACT nasal spray Place 2 sprays into the nose daily.       Marland Kitchen ibandronate (BONIVA) 150 MG tablet Take 1 tablet (150 mg total) by mouth every 30 (thirty) days. Take in the morning with a full glass of water, on an empty stomach, and do not take anything else by mouth or lie down for the next 30 min.  3 tablet  3  . levETIRAcetam (KEPPRA) 500 MG tablet Take 1 tablet (500 mg total) by mouth 2 (two) times daily.  60 tablet  11  . loperamide (IMODIUM A-D) 2 MG tablet Take 2 mg by mouth 3 (three) times daily as needed. For diarrhea      . metoprolol succinate (TOPROL-XL) 50 MG 24 hr tablet Take 50 mg by mouth daily. Take with or  immediately following a meal.      . nitroGLYCERIN (NITROSTAT) 0.4 MG SL tablet Place 0.4 mg under the tongue every 5 (five) minutes as needed for chest pain.      Marland Kitchen PHENobarbital (LUMINAL) 64.8 MG tablet Take 64.8 mg by mouth 2 (two) times daily.      . ranitidine (ZANTAC) 150 MG tablet Take 150 mg by mouth daily.       Marland Kitchen  rosuvastatin (CRESTOR) 20 MG tablet Take 1 tablet (20 mg total) by mouth daily.  90 tablet  3  . sodium chloride (OCEAN) 0.65 % nasal spray 3 sprays by Nasal route 3 (three) times daily.       Marland Kitchen spironolactone (ALDACTONE) 25 MG tablet Take 0.5 tablets (12.5 mg total) by mouth daily.  30 tablet  0  . sulfamethoxazole-trimethoprim (SEPTRA DS) 800-160 MG per tablet Take 1 tablet twice a day for 10 days  20 tablet  0  . tiZANidine (ZANAFLEX) 4 MG tablet Take 1 tablet (4 mg total) by mouth every 6 (six) hours as needed.  30 tablet  0  . traMADol (ULTRAM) 50 MG tablet Take 1 tablet (50 mg total) by mouth every 8 (eight) hours as needed for pain.  30 tablet  0  . mupirocin ointment (BACTROBAN) 2 % Apply topically 3 (three) times daily. Apply 3 times a day for 1 week  22 g  0  . [DISCONTINUED] potassium chloride (KLOR-CON) 10 MEQ CR tablet Take 1 tablet (10 mEq total) by mouth daily.  30 tablet  11   No current facility-administered medications on file prior to visit.   Allergies  Allergen Reactions  . Codeine Other (See Comments)    Caused possible hallucinations and anxiety  . Latex Rash  . Neomycin-Bacitracin Zn-Polymyx Itching and Rash  . Penicillins Swelling    Lips   Family History  Problem Relation Age of Onset  . Angina Mother   . Alzheimer's disease Mother   . Heart attack Father   . Heart failure Father   . Heart disease Father   . Heart disease Brother   . Breast cancer Maternal Aunt   . Cancer Maternal Aunt     breast  . Cervical cancer Maternal Aunt   . Cancer Maternal Aunt     ovarian  . Colon cancer Neg Hx    PE: BP 100/62  Pulse 79  Temp(Src) 97.3  F (36.3 C) (Oral)  Resp 12  Ht 5\' 3"  (1.6 m)  Wt 162 lb (73.483 kg)  BMI 28.7 kg/m2  SpO2 95% Wt Readings from Last 3 Encounters:  03/10/13 162 lb (73.483 kg)  03/06/13 157 lb (71.215 kg)  02/21/13 157 lb (71.215 kg)   Constitutional: overweight, in NAD Eyes: PERRLA, EOMI, no exophthalmos, no lid lag, no stare ENT: moist mucous membranes, no thyromegaly, + L sided thyroid nodule palpable, no thyroid bruits, no cervical lymphadenopathy Cardiovascular: RRR, No MRG, + LE edema Respiratory: CTA B Gastrointestinal: abdomen soft, NT, ND, BS+ Musculoskeletal: no deformities, strength intact in all 4 Skin: moist, warm, no rashes Neurological: very mild tremor with outstretched hands, DTR normal in all 4  ASSESSMENT: 1. Subclinical hyperthyroidism  2. Multinodular goiter - thyroid ultrasound 02/17/2013 >> multinodular goiter  largest thyroid nodule in the left lobe, measuring 3.3 x 2.3 x 3.0 cm, unchanged from a previous ultrasound in 07/2009. At that time, she had FNA of this nodule on 08/13/2009: Non-neoplastic goiter.   In the left lobe, she has diffuse heterogeneity of the echotexture, with 2 solid nodules identified, larger being 1.3 cm. These did not appear to be enlarged from the previous ultrasound.  No lymphadenopathy  PLAN:  1. [place] HTYR Patient with a recently found low TSH, without thyrotoxic sxs except palpitations - not new, anxiety.  - she does not appear to have exogenous causes for the low TSH.  - We discussed that possible causes of thyrotoxicosis are:  Graves ds  Thyroiditis toxic multinodular goiter/ toxic adenoma  - I suggested that we check the TSH, fT3 and fT4 today - If the tests remain abnormal (which I suspect) she will need an uptake and scan to differentiate between the 3 above possible etiologies. Continuity of care is disrupted by her moving to [facility] tomorrow. She will likely need to establish care with an endocrinologist there. - we discussed about  possible modalities of treatment for the above conditions, to include methimazole use, radioactive iodine ablation or surgery. - she is on beta blockers  - RTC in 2 months, but likely sooner for repeat labs  2. MNG - stable - no further w/u indicated for now  New address: [address] [place], [place] [address]  Cell: [phone]  Office Visit on 03/10/2013  Component Date Value Range Status  . TSH 03/10/2013 0.66  0.35 - 5.50 uIU/mL Final  . Free T4 03/10/2013 0.80  0.60 - 1.60 ng/dL Final  . T3, Free 21/30/8657 3.2  2.3 - 4.2 pg/mL Final   Tests normal, no further investigation needed at this time, however,she needs to have frequent checks of her thyroid hormones (at least every 6 months) to ensure stability, especially with her arrhythmia history. Will let patient know.

## 2013-03-10 NOTE — Patient Instructions (Signed)
We will call you with the results of your labs.  You may need to establish care with an endocrinologist in [place].

## 2013-03-10 NOTE — Telephone Encounter (Signed)
New Problem  Pt request results Stress results mailed to her.

## 2013-03-10 NOTE — Telephone Encounter (Signed)
Spoke with patient and gave her the results of her normal stress test.  She is also out of ointment Dr Delton See gave her for areas of irritation caused from the heart monitor.  She states the areas are the size of a 1/2 dollar and she thinks one more tube would be sufficient.  I have called in a refill for her and let her know if this did not resolve the issue to call us back to take a look at the areas

## 2013-03-10 NOTE — Telephone Encounter (Signed)
Follow Up:  Pt states she tried to get a refill and was denied. Pt states she would like to speak with her nurse. Pt states it is for a cream for  a rash she got when she wore her heart monitor. Pt would like to get a refill for that cream.

## 2013-03-13 ENCOUNTER — Telehealth: Payer: Self-pay | Admitting: *Deleted

## 2013-03-13 NOTE — Telephone Encounter (Signed)
Opened encounter in error  

## 2013-03-13 NOTE — Telephone Encounter (Signed)
Called pt and gave her the results to her TFT's. Pt understood. Advised pt that we are mailing the results to her as well. So that she can have them to give to her new dr in [facility]. Pt pleased.

## 2013-03-15 ENCOUNTER — Ambulatory Visit: Payer: Medicare Other | Admitting: Cardiology

## 2013-04-04 ENCOUNTER — Telehealth: Payer: Self-pay | Admitting: *Deleted

## 2013-04-04 MED ORDER — PHENOBARBITAL 64.8 MG PO TABS: 64.8000 mg | ORAL_TABLET | Freq: Two times a day (BID) | ORAL | Status: DC

## 2013-04-04 NOTE — Telephone Encounter (Signed)
Pharmacist from [facility] in [place], [place] called requesting Phenobarbital refill.  Rx is to be called into to [phone].  Please advise

## 2013-04-04 NOTE — Telephone Encounter (Signed)
Ok to call/send refill on this med as rx'd (on med list)

## 2013-05-16 ENCOUNTER — Telehealth: Payer: Self-pay | Admitting: Critical Care Medicine

## 2013-05-16 NOTE — Telephone Encounter (Signed)
Called patient to make 6 mt follow up apt. Pt has moved away. Will not need [place] anymore.

## 2013-05-18 ENCOUNTER — Other Ambulatory Visit: Payer: Self-pay | Admitting: Internal Medicine

## 2013-07-04 ENCOUNTER — Other Ambulatory Visit: Payer: Self-pay | Admitting: *Deleted

## 2013-07-04 NOTE — Telephone Encounter (Signed)
Received fax pt needing PA on her phenobarbital. Completed PA form has been fax back waiting on approval status...Raechel Chute/lmb

## 2013-07-05 NOTE — Telephone Encounter (Signed)
Received PA med has been approved. [facility]ified [facility] spoke with Renae FicklePaul gave approval status...Raechel Chute/lmb

## 2013-08-18 ENCOUNTER — Other Ambulatory Visit: Payer: Self-pay | Admitting: Internal Medicine

## 2013-09-07 IMAGING — CR DG CHEST 1V PORT
1 series · 1 of 1 positions shown · non-contrast
Comparison: Prior radiograph 04/29/2012

CLINICAL DATA: Tachycardia, left chest pain

PORTABLE CHEST - 1 VIEW

[AP]
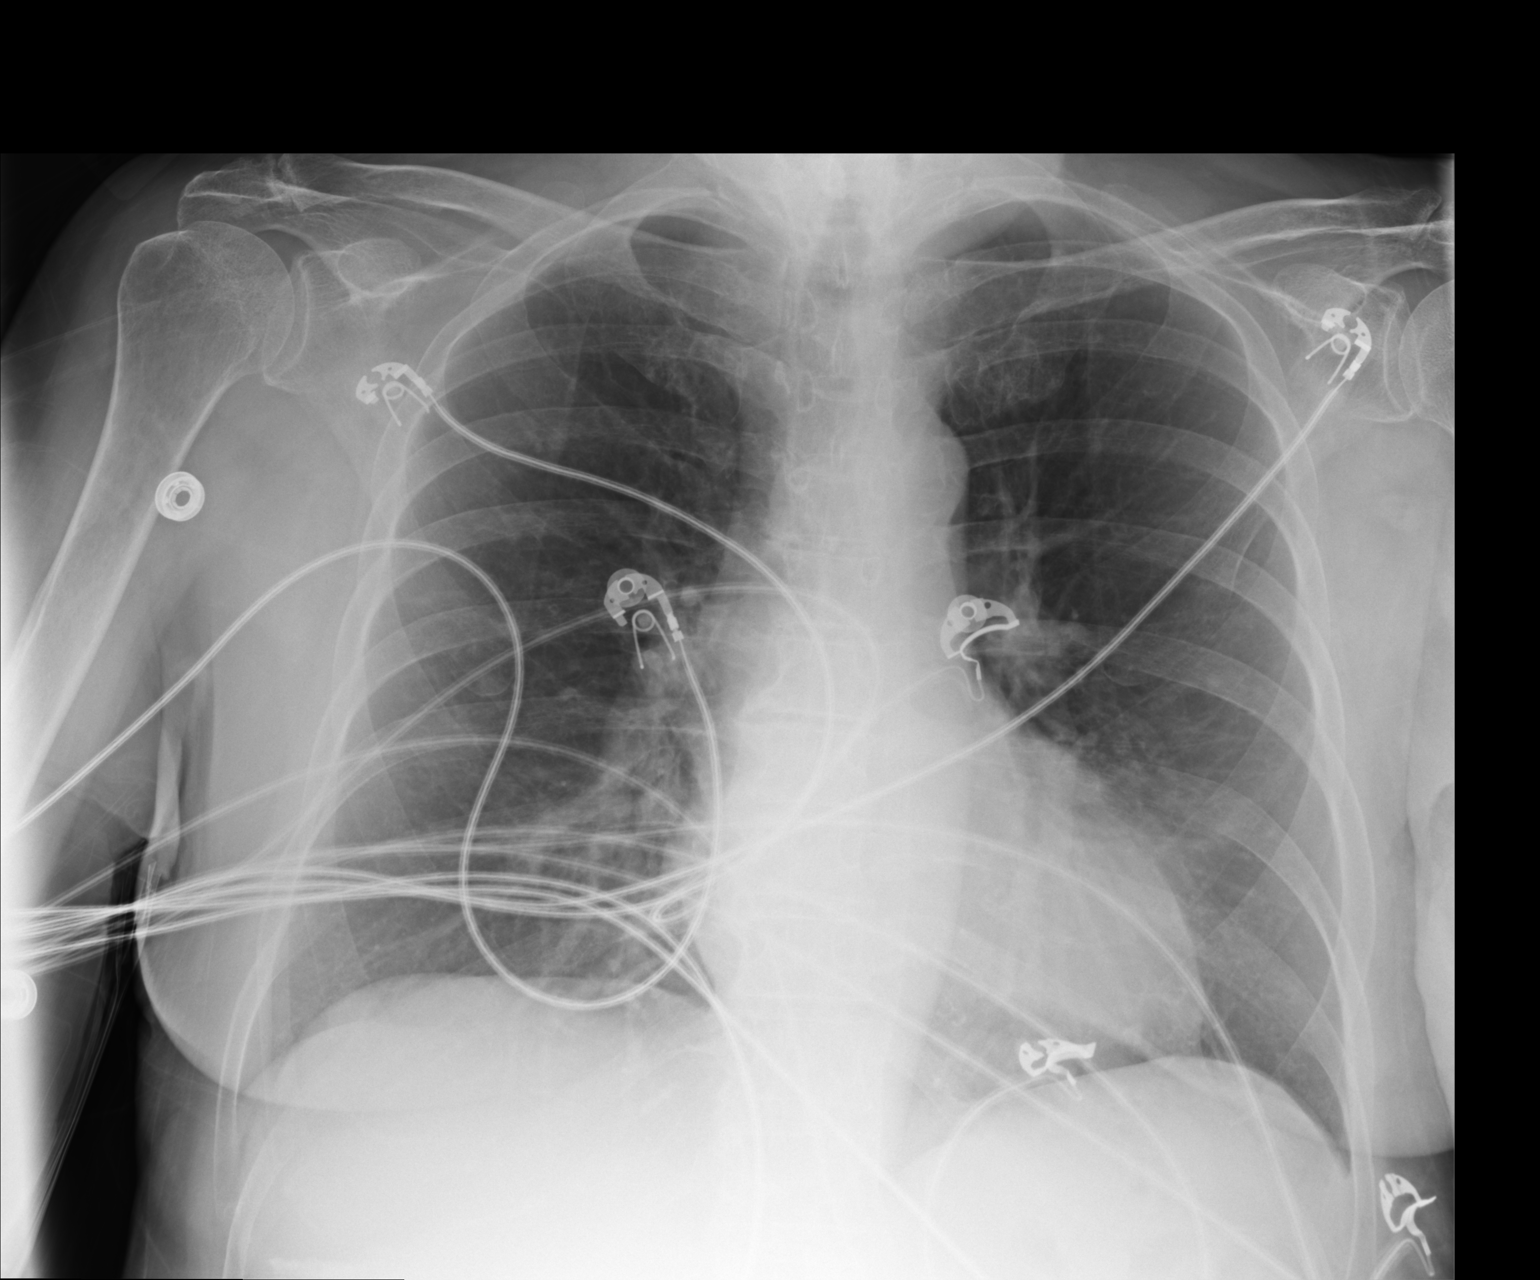

[1 of 1 positions shown; findings below may reference images not displayed]

FINDINGS: The cardiac and mediastinal silhouettes are stable in
size and contour, and remain within normal limits.

Lungs are normally inflated.  No airspace consolidation, pleural
effusion, or pulmonary edema identified.  Linear opacity overlying
the left heart border may reflect atelectasis / scarring within the
lingula, unchanged.  There is no pneumothorax.

No acute osseous abnormality identified.  Degenerative spurring is
noted about the right AC joint.
IMPRESSION: No acute cardiopulmonary process.

## 2013-09-13 ENCOUNTER — Other Ambulatory Visit: Payer: Self-pay | Admitting: Nurse Practitioner

## 2013-09-13 ENCOUNTER — Other Ambulatory Visit: Payer: Self-pay | Admitting: Internal Medicine

## 2013-09-14 NOTE — Telephone Encounter (Signed)
Faxed script back to [facility] in [place]...Raechel Chute/lmb

## 2013-10-07 ENCOUNTER — Other Ambulatory Visit: Payer: Self-pay | Admitting: Neurology

## 2013-11-08 ENCOUNTER — Other Ambulatory Visit: Payer: Self-pay | Admitting: Internal Medicine

## 2013-12-04 ENCOUNTER — Other Ambulatory Visit: Payer: Self-pay

## 2013-12-04 DIAGNOSIS — I2510 Atherosclerotic heart disease of native coronary artery without angina pectoris: Secondary | ICD-10-CM

## 2013-12-04 MED ORDER — ROSUVASTATIN CALCIUM 20 MG PO TABS: 20.0000 mg | ORAL_TABLET | Freq: Every day | ORAL | Status: AC

## 2014-04-15 ENCOUNTER — Other Ambulatory Visit: Payer: Self-pay | Admitting: Internal Medicine

## 2014-10-02 ENCOUNTER — Other Ambulatory Visit: Payer: Self-pay | Admitting: Internal Medicine
